# Patient Record
Sex: Female | Born: 1945 | Race: White | Hispanic: No | Smoking: Former smoker
Health system: Southern US, Community
[De-identification: ages and names within clinical notes are randomized; demographics above are authoritative.]

## PROBLEM LIST (undated history)

## (undated) DIAGNOSIS — M5136 Other intervertebral disc degeneration, lumbar region: Secondary | ICD-10-CM

## (undated) DIAGNOSIS — I428 Other cardiomyopathies: Secondary | ICD-10-CM

## (undated) DIAGNOSIS — K869 Disease of pancreas, unspecified: Secondary | ICD-10-CM

## (undated) DIAGNOSIS — N83291 Other ovarian cyst, right side: Secondary | ICD-10-CM

## (undated) DIAGNOSIS — K644 Residual hemorrhoidal skin tags: Secondary | ICD-10-CM

## (undated) DIAGNOSIS — I10 Essential (primary) hypertension: Secondary | ICD-10-CM

## (undated) DIAGNOSIS — M1990 Unspecified osteoarthritis, unspecified site: Secondary | ICD-10-CM

## (undated) DIAGNOSIS — I5022 Chronic systolic (congestive) heart failure: Secondary | ICD-10-CM

## (undated) DIAGNOSIS — Z9889 Other specified postprocedural states: Secondary | ICD-10-CM

## (undated) DIAGNOSIS — H269 Unspecified cataract: Secondary | ICD-10-CM

## (undated) DIAGNOSIS — C801 Malignant (primary) neoplasm, unspecified: Secondary | ICD-10-CM

## (undated) DIAGNOSIS — J45909 Unspecified asthma, uncomplicated: Secondary | ICD-10-CM

## (undated) DIAGNOSIS — T7840XA Allergy, unspecified, initial encounter: Secondary | ICD-10-CM

## (undated) DIAGNOSIS — R002 Palpitations: Secondary | ICD-10-CM

## (undated) DIAGNOSIS — M48061 Spinal stenosis, lumbar region without neurogenic claudication: Secondary | ICD-10-CM

## (undated) DIAGNOSIS — E785 Hyperlipidemia, unspecified: Secondary | ICD-10-CM

## (undated) DIAGNOSIS — Z85828 Personal history of other malignant neoplasm of skin: Secondary | ICD-10-CM

## (undated) DIAGNOSIS — K7689 Other specified diseases of liver: Secondary | ICD-10-CM

## (undated) DIAGNOSIS — M51369 Other intervertebral disc degeneration, lumbar region without mention of lumbar back pain or lower extremity pain: Secondary | ICD-10-CM

## (undated) DIAGNOSIS — K5790 Diverticulosis of intestine, part unspecified, without perforation or abscess without bleeding: Secondary | ICD-10-CM

## (undated) HISTORY — DX: Allergy, unspecified, initial encounter: T78.40XA

## (undated) HISTORY — DX: Spinal stenosis, lumbar region without neurogenic claudication: M48.061

## (undated) HISTORY — PX: HAND TENDON SURGERY: SHX663

## (undated) HISTORY — DX: Other intervertebral disc degeneration, lumbar region: M51.36

## (undated) HISTORY — DX: Diverticulosis of intestine, part unspecified, without perforation or abscess without bleeding: K57.90

## (undated) HISTORY — DX: Disease of pancreas, unspecified: K86.9

## (undated) HISTORY — DX: Unspecified osteoarthritis, unspecified site: M19.90

## (undated) HISTORY — DX: Other intervertebral disc degeneration, lumbar region without mention of lumbar back pain or lower extremity pain: M51.369

## (undated) HISTORY — DX: Essential (primary) hypertension: I10

## (undated) HISTORY — DX: Hyperlipidemia, unspecified: E78.5

## (undated) HISTORY — PX: ABDOMINAL HYSTERECTOMY: SHX81

## (undated) HISTORY — DX: Malignant (primary) neoplasm, unspecified: C80.1

## (undated) HISTORY — DX: Residual hemorrhoidal skin tags: K64.4

## (undated) HISTORY — DX: Other specified diseases of liver: K76.89

## (undated) HISTORY — PX: TONSILLECTOMY: SUR1361

## (undated) HISTORY — PX: SMALL INTESTINE SURGERY: SHX150

## (undated) HISTORY — DX: Unspecified cataract: H26.9

---
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
## 1992-05-05 HISTORY — PX: VAGINAL HYSTERECTOMY: SUR661

## 1997-10-26 ENCOUNTER — Ambulatory Visit (HOSPITAL_COMMUNITY): Admission: RE | Admit: 1997-10-26 | Discharge: 1997-10-26 | Payer: Self-pay | Admitting: Obstetrics and Gynecology

## 1998-05-24 ENCOUNTER — Ambulatory Visit (HOSPITAL_COMMUNITY): Admission: RE | Admit: 1998-05-24 | Discharge: 1998-05-24 | Payer: Self-pay | Admitting: Obstetrics and Gynecology

## 1999-10-08 ENCOUNTER — Encounter: Payer: Self-pay | Admitting: Obstetrics and Gynecology

## 1999-10-09 ENCOUNTER — Ambulatory Visit (HOSPITAL_COMMUNITY): Admission: RE | Admit: 1999-10-09 | Discharge: 1999-10-09 | Payer: Self-pay | Admitting: Obstetrics and Gynecology

## 2000-04-02 ENCOUNTER — Encounter: Payer: Self-pay | Admitting: Urology

## 2000-04-02 ENCOUNTER — Encounter: Admission: RE | Admit: 2000-04-02 | Discharge: 2000-04-02 | Payer: Self-pay | Admitting: Urology

## 2000-04-13 ENCOUNTER — Encounter: Payer: Self-pay | Admitting: General Surgery

## 2000-04-13 ENCOUNTER — Ambulatory Visit (HOSPITAL_COMMUNITY): Admission: RE | Admit: 2000-04-13 | Discharge: 2000-04-13 | Payer: Self-pay | Admitting: General Surgery

## 2000-04-29 ENCOUNTER — Encounter: Payer: Self-pay | Admitting: General Surgery

## 2000-05-04 ENCOUNTER — Inpatient Hospital Stay (HOSPITAL_COMMUNITY): Admission: RE | Admit: 2000-05-04 | Discharge: 2000-05-07 | Payer: Self-pay | Admitting: General Surgery

## 2000-05-04 ENCOUNTER — Encounter (INDEPENDENT_AMBULATORY_CARE_PROVIDER_SITE_OTHER): Payer: Self-pay | Admitting: Specialist

## 2000-10-09 ENCOUNTER — Ambulatory Visit (HOSPITAL_COMMUNITY): Admission: RE | Admit: 2000-10-09 | Discharge: 2000-10-09 | Payer: Self-pay | Admitting: Obstetrics and Gynecology

## 2000-10-09 ENCOUNTER — Encounter: Payer: Self-pay | Admitting: Obstetrics and Gynecology

## 2001-01-21 ENCOUNTER — Ambulatory Visit (HOSPITAL_COMMUNITY): Admission: RE | Admit: 2001-01-21 | Discharge: 2001-01-21 | Payer: Self-pay | Admitting: Urology

## 2001-01-21 ENCOUNTER — Encounter (INDEPENDENT_AMBULATORY_CARE_PROVIDER_SITE_OTHER): Payer: Self-pay | Admitting: Specialist

## 2001-10-13 ENCOUNTER — Encounter: Payer: Self-pay | Admitting: Obstetrics and Gynecology

## 2001-10-13 ENCOUNTER — Ambulatory Visit (HOSPITAL_COMMUNITY): Admission: RE | Admit: 2001-10-13 | Discharge: 2001-10-13 | Payer: Self-pay | Admitting: Obstetrics and Gynecology

## 2002-01-25 ENCOUNTER — Other Ambulatory Visit: Admission: RE | Admit: 2002-01-25 | Discharge: 2002-01-25 | Payer: Self-pay | Admitting: Obstetrics and Gynecology

## 2003-05-06 HISTORY — PX: HEMICOLECTOMY: SHX854

## 2003-05-23 ENCOUNTER — Ambulatory Visit (HOSPITAL_COMMUNITY): Admission: RE | Admit: 2003-05-23 | Discharge: 2003-05-23 | Payer: Self-pay | Admitting: Obstetrics and Gynecology

## 2004-02-20 ENCOUNTER — Ambulatory Visit (HOSPITAL_COMMUNITY): Admission: RE | Admit: 2004-02-20 | Discharge: 2004-02-20 | Payer: Self-pay | Admitting: Urology

## 2004-05-27 ENCOUNTER — Encounter: Admission: RE | Admit: 2004-05-27 | Discharge: 2004-05-27 | Payer: Self-pay | Admitting: Obstetrics and Gynecology

## 2004-06-17 ENCOUNTER — Other Ambulatory Visit: Admission: RE | Admit: 2004-06-17 | Discharge: 2004-06-17 | Payer: Self-pay | Admitting: Obstetrics and Gynecology

## 2005-03-19 ENCOUNTER — Ambulatory Visit (HOSPITAL_COMMUNITY): Admission: RE | Admit: 2005-03-19 | Discharge: 2005-03-19 | Payer: Self-pay | Admitting: Urology

## 2005-05-15 ENCOUNTER — Ambulatory Visit (HOSPITAL_COMMUNITY): Admission: RE | Admit: 2005-05-15 | Discharge: 2005-05-15 | Payer: Self-pay | Admitting: *Deleted

## 2005-06-02 ENCOUNTER — Encounter: Admission: RE | Admit: 2005-06-02 | Discharge: 2005-06-02 | Payer: Self-pay | Admitting: *Deleted

## 2005-10-27 ENCOUNTER — Encounter: Admission: RE | Admit: 2005-10-27 | Discharge: 2005-10-27 | Payer: Self-pay | Admitting: Obstetrics and Gynecology

## 2006-06-02 ENCOUNTER — Ambulatory Visit: Payer: Self-pay | Admitting: Family Medicine

## 2006-06-19 ENCOUNTER — Ambulatory Visit: Payer: Self-pay | Admitting: Family Medicine

## 2006-06-19 LAB — CONVERTED CEMR LAB
ALT: 27 units/L (ref 0–40)
AST: 24 units/L (ref 0–37)
Basophils Relative: 0.6 % (ref 0.0–1.0)
Bilirubin, Direct: 0.1 mg/dL (ref 0.0–0.3)
CO2: 33 meq/L — ABNORMAL HIGH (ref 19–32)
Calcium: 9.1 mg/dL (ref 8.4–10.5)
Chloride: 103 meq/L (ref 96–112)
Direct LDL: 115.7 mg/dL
Eosinophils Relative: 2 % (ref 0.0–5.0)
GFR calc Af Amer: 94 mL/min
Glucose, Bld: 93 mg/dL (ref 70–99)
HCT: 38.9 % (ref 36.0–46.0)
HDL: 60.4 mg/dL (ref >39.0)
Hemoglobin: 13.6 g/dL (ref 12.0–15.0)
Lymphocytes Relative: 28.8 % (ref 12.0–46.0)
MCV: 93.5 fL (ref 78.0–100.0)
Neutro Abs: 4.2 10*3/uL (ref 1.4–7.7)
Neutrophils Relative %: 60.7 % (ref 43.0–77.0)
Platelets: 278 10*3/uL (ref 150–400)
Sodium: 143 meq/L (ref 135–145)
Total Protein: 7 g/dL (ref 6.0–8.3)
VLDL: 41 mg/dL — ABNORMAL HIGH (ref 0–40)
WBC: 6.7 10*3/uL (ref 4.5–10.5)

## 2006-11-10 ENCOUNTER — Encounter: Admission: RE | Admit: 2006-11-10 | Discharge: 2006-11-10 | Payer: Self-pay | Admitting: Obstetrics and Gynecology

## 2007-05-04 ENCOUNTER — Ambulatory Visit: Payer: Self-pay | Admitting: Family Medicine

## 2007-05-04 DIAGNOSIS — J309 Allergic rhinitis, unspecified: Secondary | ICD-10-CM | POA: Insufficient documentation

## 2007-05-04 DIAGNOSIS — J45909 Unspecified asthma, uncomplicated: Secondary | ICD-10-CM

## 2007-05-04 DIAGNOSIS — I10 Essential (primary) hypertension: Secondary | ICD-10-CM

## 2007-05-04 DIAGNOSIS — M1990 Unspecified osteoarthritis, unspecified site: Secondary | ICD-10-CM

## 2007-05-04 DIAGNOSIS — E785 Hyperlipidemia, unspecified: Secondary | ICD-10-CM | POA: Insufficient documentation

## 2007-05-27 ENCOUNTER — Ambulatory Visit: Payer: Self-pay | Admitting: Family Medicine

## 2007-05-28 ENCOUNTER — Telehealth: Payer: Self-pay | Admitting: Family Medicine

## 2007-05-28 LAB — CONVERTED CEMR LAB
ALT: 29 units/L (ref 0–35)
Basophils Relative: 0.1 % (ref 0.0–1.0)
Bilirubin, Direct: 0.1 mg/dL (ref 0.0–0.3)
CO2: 31 meq/L (ref 19–32)
Direct LDL: 163.8 mg/dL
Eosinophils Relative: 2.9 % (ref 0.0–5.0)
GFR calc Af Amer: 82 mL/min
Glucose, Bld: 81 mg/dL (ref 70–99)
Hemoglobin: 13.1 g/dL (ref 12.0–15.0)
Lymphocytes Relative: 28.2 % (ref 12.0–46.0)
MCV: 95.2 fL (ref 78.0–100.0)
Monocytes Absolute: 0.6 10*3/uL (ref 0.2–0.7)
Neutro Abs: 4 10*3/uL (ref 1.4–7.7)
Neutrophils Relative %: 59.7 % (ref 43.0–77.0)
Potassium: 4.1 meq/L (ref 3.5–5.1)
Sodium: 140 meq/L (ref 135–145)
TSH: 0.8 microintl units/mL (ref 0.35–5.50)
Total Protein: 6.7 g/dL (ref 6.0–8.3)
VLDL: 42 mg/dL — ABNORMAL HIGH (ref 0–40)
WBC: 6.7 10*3/uL (ref 4.5–10.5)

## 2007-06-28 ENCOUNTER — Telehealth: Payer: Self-pay | Admitting: Family Medicine

## 2007-07-15 ENCOUNTER — Encounter: Payer: Self-pay | Admitting: Family Medicine

## 2007-07-21 ENCOUNTER — Telehealth: Payer: Self-pay | Admitting: Family Medicine

## 2007-07-23 ENCOUNTER — Ambulatory Visit: Payer: Self-pay | Admitting: Internal Medicine

## 2007-08-06 ENCOUNTER — Encounter: Payer: Self-pay | Admitting: Family Medicine

## 2007-08-06 ENCOUNTER — Ambulatory Visit: Payer: Self-pay | Admitting: Internal Medicine

## 2007-08-06 HISTORY — PX: COLONOSCOPY: SHX174

## 2007-09-10 ENCOUNTER — Telehealth: Payer: Self-pay | Admitting: Family Medicine

## 2007-10-08 ENCOUNTER — Ambulatory Visit: Payer: Self-pay | Admitting: Family Medicine

## 2007-10-11 LAB — CONVERTED CEMR LAB
Alkaline Phosphatase: 56 units/L (ref 39–117)
Bilirubin, Direct: 0.1 mg/dL (ref 0.0–0.3)
Cholesterol: 161 mg/dL (ref 0–200)
LDL Cholesterol: 78 mg/dL (ref 0–99)
Total Protein: 6.6 g/dL (ref 6.0–8.3)

## 2007-12-22 ENCOUNTER — Encounter: Admission: RE | Admit: 2007-12-22 | Discharge: 2007-12-22 | Payer: Self-pay | Admitting: Obstetrics and Gynecology

## 2008-03-02 ENCOUNTER — Telehealth: Payer: Self-pay | Admitting: Family Medicine

## 2008-05-02 ENCOUNTER — Encounter: Payer: Self-pay | Admitting: Family Medicine

## 2008-07-14 ENCOUNTER — Encounter: Payer: Self-pay | Admitting: Family Medicine

## 2008-09-08 ENCOUNTER — Ambulatory Visit: Payer: Self-pay | Admitting: Family Medicine

## 2008-11-15 ENCOUNTER — Ambulatory Visit: Payer: Self-pay | Admitting: Family Medicine

## 2008-11-15 LAB — CONVERTED CEMR LAB
Blood in Urine, dipstick: NEGATIVE
Ketones, urine, test strip: NEGATIVE
Nitrite: NEGATIVE
Urobilinogen, UA: 0.2

## 2009-02-02 ENCOUNTER — Encounter: Admission: RE | Admit: 2009-02-02 | Discharge: 2009-02-02 | Payer: Self-pay | Admitting: Obstetrics and Gynecology

## 2009-03-27 ENCOUNTER — Ambulatory Visit: Payer: Self-pay | Admitting: Family Medicine

## 2009-03-27 DIAGNOSIS — F411 Generalized anxiety disorder: Secondary | ICD-10-CM | POA: Insufficient documentation

## 2009-05-14 ENCOUNTER — Telehealth: Payer: Self-pay | Admitting: Family Medicine

## 2009-05-30 ENCOUNTER — Ambulatory Visit: Payer: Self-pay | Admitting: Family Medicine

## 2009-10-19 ENCOUNTER — Telehealth: Payer: Self-pay | Admitting: Family Medicine

## 2010-02-11 ENCOUNTER — Ambulatory Visit: Payer: Self-pay | Admitting: Family Medicine

## 2010-02-11 DIAGNOSIS — S335XXA Sprain of ligaments of lumbar spine, initial encounter: Secondary | ICD-10-CM

## 2010-04-08 ENCOUNTER — Encounter: Admission: RE | Admit: 2010-04-08 | Discharge: 2010-04-08 | Payer: Self-pay | Admitting: Obstetrics and Gynecology

## 2010-05-24 ENCOUNTER — Emergency Department (HOSPITAL_BASED_OUTPATIENT_CLINIC_OR_DEPARTMENT_OTHER)
Admission: EM | Admit: 2010-05-24 | Discharge: 2010-05-24 | Payer: Self-pay | Source: Home / Self Care | Admitting: Emergency Medicine

## 2010-05-25 ENCOUNTER — Encounter: Payer: Self-pay | Admitting: Obstetrics and Gynecology

## 2010-05-27 ENCOUNTER — Encounter: Payer: Self-pay | Admitting: Obstetrics and Gynecology

## 2010-05-28 ENCOUNTER — Encounter: Payer: Self-pay | Admitting: Family Medicine

## 2010-06-06 NOTE — Progress Notes (Signed)
Summary: refill Lorazepam  Phone Note Refill Request Message from:  Fax from Pharmacy on October 19, 2009 11:20 AM  Refills Requested: Medication #1:  ATIVAN 0.5 MG TABS 1-2 tabs q 6 hours as needed anxiety   Dosage confirmed as above?Dosage Confirmed   Last Refilled: 07/06/2009  Method Requested: Fax to Local Pharmacy Initial call taken by: Raechel Ache, RN,  October 19, 2009 11:21 AM Caller: [facility]  [phone]*  Follow-up for Phone Call        call in #60 with 5 rf Follow-up by: Nelwyn Salisbury MD,  October 19, 2009 4:36 PM  Additional Follow-up for Phone Call Additional follow up Details #1::        Rx faxed to pharmacy Additional Follow-up by: Raechel Ache, RN,  October 19, 2009 4:43 PM    Prescriptions: ATIVAN 0.5 MG TABS (LORAZEPAM) 1-2 tabs q 6 hours as needed anxiety  #60 x 5   Entered by:   Raechel Ache, RN   Authorized by:   Nelwyn Salisbury MD   Signed by:   Raechel Ache, RN on 10/19/2009   Method used:   Historical   RxID:   [id number]

## 2010-06-06 NOTE — Assessment & Plan Note (Signed)
Summary: back pain/flu shot/njr   Vital Signs:  Patient profile:   65 year old female Weight:      176 pounds O2 Sat:      96 % Temp:     97.6 degrees F Pulse rate:   92 / minute BP sitting:   112 / 74  (left arm)  Vitals Entered By: Pura Spice, RN (February 11, 2010 1:14 PM) CC: back pain was paving driveway  hurts when standing  with pain down Left leg.   History of Present Illness: Here for 2 days of stiffness and pain in the left lower back which radiates down the left leg. No weakness or numbness. This started the morning after she used paving stones to pave her driveway. Heat and Advil help a little.   Allergies: 1)  ! Lipitor  Past History:  Past Medical History: Reviewed history from 11/15/2008 and no changes required. Allergic rhinitis Hyperlipidemia Hypertension Osteoarthritis Asthma sees Dr. Stefano Gaul for GYN exams  Past Surgical History: Reviewed history from 11/15/2008 and no changes required. colonoscopy 08-06-07 per Dr. 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         Flu Vaccine Consent Questions     Do you have a history of severe allergic reactions to this vaccine? no    Any prior history of allergic reactions to egg and/or gelatin? no    Do you have a sensitivity to the preservative Thimersol? no    Do you have a past history of Guillan-Barre Syndrome? no    Do you  currently have an acute febrile illness? no    Have you ever had a severe reaction to latex? no    Vaccine information given and explained to patient? yes    Are you currently pregnant? no    Lot Number:[id number]   Exp Date:11/02/2010   Site Given  Left Deltoid IM Pura Spice, RN  February 11, 2010 1:16 PM   Physical Exam  General:  in some pain, walks with a slight limp Abdomen:  Bowel sounds positive,abdomen soft and non-tender without masses, organomegaly or hernias noted. Msk:  No deformity or scoliosis noted of thoracic or lumbar spine.  Extending her spine is painful, though ROM is intact. negative SLR Neurologic:  alert & oriented X3, strength normal in all extremities, and sensation intact to light touch.     Impression & Recommendations:  Problem # 1:  LUMBAR STRAIN (ICD-847.2)  Complete Medication List: 1)  Cartia Xt 240 Mg Cp24 (Diltiazem hcl coated beads) .... Take 1 tab by mouth daily 2)  Premarin 0.625 Mg Tabs (Estrogens conjugated) .[name].. 1 twice a wk 3)  Co Q-10 50 Mg Caps (Coenzyme q10) .[name].. 1 by mouth once daily 4)  Multivitamins Tabs (Multiple vitamin) .[name].. 1 by mouth once daily 5)  Bl Vitamin E 200 Unit Caps (Vitamin e) .[name].. 1 by mouth once daily 6)  Crestor 5 Mg Tabs (Rosuvastatin calcium) .[name].. 1 by mouth once daily 7)  Zyrtec Allergy 10 Mg Tabs (Cetirizine hcl) .... Once daily 8)  Ventolin Hfa 108 (90  Base) Mcg/act Aers (Albuterol sulfate) .... 2 puffs q 4 hours as needed 9)  Ativan 0.5 Mg Tabs (Lorazepam) .[name].. 1-2 tabs q 6 hours as needed anxiety 10)  Dulcolax 5 Mg Tbec (Bisacodyl) 11)  Diovan Hct 320-25 Mg Tabs (Valsartan-hydrochlorothiazide) .... Take 1 tab daily 12)  Flexeril 10 Mg Tabs (Cyclobenzaprine hcl) .... Three times a day as needed for spasm 13)  Prednisone (pak) 10 Mg Tabs (Prednisone) .... As directed for 12 days  Other Orders: Admin 1st Vaccine (16109) Flu Vaccine 22yrs + (60454)  Patient Instructions: 1)  Please schedule a follow-up  appointment as needed .  Prescriptions: PREDNISONE (PAK) 10 MG TABS (PREDNISONE) as directed for 12 days  #1 x 0   Entered and Authorized by:   Nelwyn Salisbury MD   Signed by:   Nelwyn Salisbury MD on 02/11/2010   Method used:   [facility]        [facility]  [address] [phone][facility] ([facility]ail)       [address] [place], [place]  [address]       Ph: [phone] or [phone]       Fax: [phone]   RxID:   [id number] FLEXERIL 10 MG TABS (CYCLOBENZAPRINE HCL) three times a day as needed for spasm  #60 x 2   Entered and Authorized by:   Nelwyn Salisbury MD   Signed by:   Nelwyn Salisbury MD on 02/11/2010   Method used:   [facility]        [facility]  [address] [phone][facility] (retail)       [address] [place], [place]  [address]       Ph: [phone] or [phone]       Fax: [phone]   RxID:   [id number]

## 2010-06-06 NOTE — Progress Notes (Signed)
Summary: Pt rcvd Diovan 160-12.5, but it should be for 320-25mg   Phone Note Call from Patient Call back at Home Phone [phone]   Caller: Patient Summary of Call: Pt called and said that she has been taking Diovan 320-25mg  ever since Dr. Clent Ridges prescribed dosage. Pt rcvd her order from [facility], and it was for Diovan 160-12.5mg  HCT. Pt is wondering why med has been changed. Please call in original dose of Diovan 320-25mg  to [facility] order.  Initial call taken by: Lucy Antigua,  May 14, 2009 4:34 PM  Follow-up for Phone Call        I agree. The last refill request we got from [facility] had this lower dose for some reason. cancel this and call in Diovan 320/25 once daily #90 with 3 rf Follow-up by: Nelwyn Salisbury MD,  May 15, 2009 11:49 AM  Additional Follow-up for Phone Call Additional follow up Details #1::        Phone Call Completed, Rx Called In Additional Follow-up by: Alfred Levins, CMA,  May 15, 2009 12:14 PM    New/Updated Medications: DIOVAN HCT 320-25 MG TABS (VALSARTAN-HYDROCHLOROTHIAZIDE) Take 1 tab daily Prescriptions: DIOVAN HCT 320-25 MG TABS (VALSARTAN-HYDROCHLOROTHIAZIDE) Take 1 tab daily  #90 x 3   Entered by:   Alfred Levins, CMA   Authorized by:   Nelwyn Salisbury MD   Signed by:   Alfred Levins, CMA on 05/15/2009   Method used:   Electronically to        [facility]* (mail-order)             ,          Ph: [phone]       Fax: [phone]   RxID:   [id number]

## 2010-06-06 NOTE — Assessment & Plan Note (Signed)
Summary: st/dry cough/njr   Vital Signs:  Patient profile:   65 year old female Weight:      180 pounds Temp:     98.8 degrees F oral Pulse rate:   101 / minute BP sitting:   108 / 70  (left arm) Cuff size:   large  Vitals Entered By: Alfred Levins, CMA (May 30, 2009 3:49 PM) CC: dry cough x2 days   History of Present Illness: Here for 2 days of a dry cough and a ST. Some hoarseness. No sinus symptoms or fever. On Robitussin.  Allergies: 1)  ! Lipitor  Past History:  Past Medical History: Reviewed history from 11/15/2008 and no changes required. Allergic rhinitis Hyperlipidemia Hypertension Osteoarthritis Asthma sees Dr. Stefano Gaul for GYN exams  Review of Systems  The patient denies anorexia, fever, weight loss, weight gain, vision loss, decreased hearing, hoarseness, chest pain, syncope, dyspnea on exertion, peripheral edema, headaches, hemoptysis, abdominal pain, melena, hematochezia, severe indigestion/heartburn, hematuria, incontinence, genital sores, muscle weakness, suspicious skin lesions, transient blindness, difficulty walking, depression, unusual weight change, abnormal bleeding, enlarged lymph nodes, angioedema, breast masses, and testicular masses.    Physical Exam  General:  Well-developed,well-nourished,in no acute distress; alert,appropriate and cooperative throughout examination Head:  Normocephalic and atraumatic without obvious abnormalities. No apparent alopecia or balding. Eyes:  No corneal or conjunctival inflammation noted. EOMI. Perrla. Funduscopic exam benign, without hemorrhages, exudates or papilledema. Vision grossly normal. Ears:  External ear exam shows no significant lesions or deformities.  Otoscopic examination reveals clear canals, tympanic membranes are intact bilaterally without bulging, retraction, inflammation or discharge. Hearing is grossly normal bilaterally. Nose:  External nasal examination shows no deformity or inflammation.  Nasal mucosa are pink and moist without lesions or exudates. Mouth:  Oral mucosa and oropharynx without lesions or exudates.  Teeth in good repair. Neck:  No deformities, masses, or tenderness noted. Lungs:  Normal respiratory effort, chest expands symmetrically. Lungs are clear to auscultation, no crackles or wheezes.   Impression & Recommendations:  Problem # 1:  VIRAL URI (ICD-465.9)  Her updated medication list for this problem includes:    Zyrtec Allergy 10 Mg Tabs (Cetirizine hcl) ..... Once daily    Hydromet 5-1.5 Mg/108ml Syrp (Hydrocodone-homatropine) .[name]... 1 tsp q 4hours as needed cough  Complete Medication List: 1)  Cartia Xt 240 Mg Cp24 (Diltiazem hcl coated beads) .... Take 1 tab by mouth daily 2)  Premarin 0.625 Mg Tabs (Estrogens conjugated) .[name].. 1 twice a wk 3)  Co Q-10 50 Mg Caps (Coenzyme q10) .[name].. 1 by mouth once daily 4)  Multivitamins Tabs (Multiple vitamin) .[name].. 1 by mouth once daily 5)  Bl Vitamin E 200 Unit Caps (Vitamin e) .[name].. 1 by mouth once daily 6)  Crestor 5 Mg Tabs (Rosuvastatin calcium) .[name].. 1 by mouth once daily 7)  Singulair 10 Mg Tabs (Montelukast sodium) .[name].. 1 by mouth daily 8)  Zyrtec Allergy 10 Mg Tabs (Cetirizine hcl) .... Once daily 9)  Ventolin Hfa 108 (90 Base) Mcg/act Aers (Albuterol sulfate) .... 2 puffs q 4 hours as needed 10)  Ativan 0.5 Mg Tabs (Lorazepam) .[name].. 1-2 tabs q 6 hours as needed anxiety 11)  Dulcolax 5 Mg Tbec (Bisacodyl) 12)  Reglan 10 Mg Tabs (Metoclopramide hcl) 13)  Diovan Hct 320-25 Mg Tabs (Valsartan-hydrochlorothiazide) .... Take 1 tab daily 14)  Hydromet 5-1.5 Mg/59ml Syrp (Hydrocodone-homatropine) .[name].. 1 tsp q 4hours as needed cough  Patient Instructions: 1)  Please schedule a follow-up appointment as needed . rest, drink  fluids. Prescriptions: HYDROMET 5-1.5 MG/5ML SYRP (HYDROCODONE-HOMATROPINE) 1 tsp q 4hours as needed cough  #240 x 0   Entered and Authorized by:   Nelwyn Salisbury MD   Signed by:   Nelwyn Salisbury MD on  05/30/2009   Method used:   Print then Give to Patient   RxID:   [id number]

## 2010-06-12 NOTE — Letter (Signed)
Summary: [facility]   Imported By: Maryln Gottron 06/07/2010 11:26:16  _____________________________________________________________________  External Attachment:    Type:   Image     Comment:   External Document

## 2010-07-03 ENCOUNTER — Other Ambulatory Visit: Payer: Self-pay | Admitting: Family Medicine

## 2010-09-20 NOTE — Discharge Summary (Signed)
[facility]  Patient:    Tina May, Tina May                MRN: [id number] Adm. Date:  [id number] Disc. Date: [id number] Attending:  Brandy Hale CC:         Lindaann Slough, M.D.  Janine Limbo, M.D.  Genene Churn. Sherin Quarry, M.D.   Discharge Summary  DISCHARGE DIAGNOSES: 1. Benign hemangioma of the jejunum. 2. Small benign-appearing liver cyst. 3. Recent urinary tract infection.  PROCEDURES:  Laparoscopic-assisted small bowel resection with frozen section on May 04, 2000.  HISTORY OF PRESENT ILLNESS:  This is a 65 year old, white female who was recently evaluated by Dr. Stefano Gaul for pelvic pain and suprapubic discomfort with dysuria.  She was treated for a urinary tract infection and a CAT scan was ordered that showed two, tiny benign liver cysts in a jejunal intraluminal solitary soft tissue mass seen on several cuts.  She was then sent to me by Dr. Brunilda Payor for evaluation of the abnormal CT scan.  She has no GI symptoms specifically.  She had had a colonoscopy in July 2001, by Dr. Sherin Quarry, and this was normal.  On exam, she had no abnormal abdominal findings and no adenopathy was noted.  A small bowel enteroclysis study confirmed the presence of a jejunal intraluminal filling defect.  The patient was advised of multiple diagnostic possibilities.  The decision was made for elective resection.  HOSPITAL COURSE:  On the day of admission, the patient was taken to the operating room where she underwent laparoscopic-assisted small bowel resection.  We were able to palpate a small polypoid mass in the upper jejunum.  We were able to perform a laparoscopic-assisted segmental small bowel resection removing only about four to six inches of small bowel.  Frozen section diagnosis and final diagnosis was a benign hemangioma.  Postoperatively, the patient did well.  She progressed in her diet and activities fairly briskly and was able to be  discharged on May 07, 2000, postop day #3.  At the time of discharge, she was tolerating diet, had had bowel movement, voiding okay, abdomen benign, wounds looked good and she wanted to go home.  FOLLOWUP:  She was asked to return to see me in the office in about five days for staple removal.  DIET:  Diet was discussed.  ACTIVITIES:  Activity was discussed. DD:  05/18/00 TD:  05/18/00 Job: [id number]

## 2010-09-20 NOTE — Op Note (Signed)
[facility]  Patient:    Tina May, Tina May                MRN: [id number] Proc. Date: 05/04/00 Adm. Date:  [id number] Attending:  Brandy Hale CC:         Genene Churn. Sherin Quarry, M.D.  Lindaann Slough, M.D.  Janine Limbo, M.D.   Operative Report  PREOPERATIVE DIAGNOSIS:  Intraluminal mass of jejunum.  POSTOPERATIVE DIAGNOSIS:  Hemangioma of jejunum.  OPERATION PERFORMED:  Laparoscopically-assisted small bowel resection with frozen section.  SURGEON:  Angelia Mould. Derrell Lolling, M.D.  ASSISTANT:  Chevis Pretty, M.D.  OPERATIVE INDICATIONS:  This is a 65 year old white female, who was recently evaluated for dysuria and pelvic pain.  As part of her work-up, a CT scan was performed, which showed a jejunal intraluminal solitary soft tissue mass.  The patient really does not have any GI symptoms and this was thought to be an incidental finding.  I saw the patient and we obtained an enteroclysis small bowel study and that study was diagnostic of an intraluminal mass in the proximal jejunum felt to be as much as 25 mm in maximum diameter.  Benign etiology was suggested, but not confirmed by radiographic criteria.  Fairly large differential diagnosis, including benign and malignant disease was entertained.  Risk of intussusception was also entertained.  Growth of the tumor was felt to be possible.  I advised the patient of these risks and she and her husband both agreed that they would like this area resected.  OPERATIVE FINDINGS:  The patient had a 2.0 x 1.0 cm fleshy tumor in the jejunum about 12-15 inches distal to the ligament of Treitz.  Frozen section revealed this to be probably a benign hemangioma.  Dr. Jimmy Picket read the pathology.  No other abnormalities were noted.  There was no adenopathy. There was no thickening of the mesentery.  OPERATIVE TECHNIQUE:  Following the induction of general endotracheal anesthesia, the patients abdomen  was prepped and draped in a sterile fashion. Marcaine 0.5% with epinephrine was used as a local infiltration anesthetic.  A short transverse incision was made below the umbilicus.  The fascia was incised transversely.  The abdominal cavity was entered under direct vision. A 10 mm Hasson trocar was inserted and secured with a purse-string suture of 0 Vicryl.  Pneumoperitoneum was created.  Video camera was inserted.  Two 5 mm trocars were placed, one in the right upper quadrant and one in the lower midline.  We positioned the patient and examined the liver and gallbladder, which were felt to be normal.  Examined the stomach and duodenum and transverse colon, which were felt to be normal.  With good positioning, we were able to move the transverse colon and the omentum out of the way.  We were able to run the small bowel in its entirety.  We identified the ligament of Treitz.  There was no abnormality of the small bowel on inspection, but about 1 to 1.5 feet distal to the ligament of Treitz we felt that there was a bumpy area, which was suggested by milking the bowel with the Glassman clamps. We elected at that point to make a short vertical midline incision above the umbilicus approximately 4 inches long.  The fascia was incised in the midline and the abdominal cavity entered.  We delivered the small bowel and again, examined it thoroughly from the ligament of Treitz all the way to the ileocecal valve.  We did palpate a 2  cm soft, rubbery mass in the proximal jejunum about 12-15 inches distal to the ligament of Treitz.  This was marked with a suture.  The small bowel was transected about 3 inches proximal and 3 inches distal to this area using a GIA stapling device.  Mesenteric vessels were isolated, clamped, divided and ligated with 2-0 silk ties.  We opened the specimen on a back table and found the above described tumor.  Dr. Jimmy Picket did a frozen section and felt that this was a  benign hemangioma or other benign vascular tumor.  Anastomosis was created between the proximal and distal limbs of small bowel using the GIA stapling device.  The defect in the bowel wall was closed with a TA-60 stapling device.  There was no bleeding internally from the staple lines.  Externally, there were a couple of bleeding points that were controlled with figure-of-eight sutures of 3-0 silk.  The mesentery was closed with interrupted sutures of 3-0 silk.  We then changed our instruments and gloves.  The abdomen was irrigated with saline.  The area of dissection and the omentum and the colon were inspected and there was no bleeding.  Midline fascia was closed with a running suture of #1 PDS.  The trocars were removed and the fascia at the umbilicus was closed with 0 Vicryl. All of the incisions were closed with skin staples following irrigation. Clean bandages were placed and the patient taken to the recovery room in stable condition.  Estimated blood loss was about 50 cc or less.  Sponge, needle and instrument counts were correct. DD:  05/04/00 TD:  05/04/00 Job: [id number]

## 2010-09-20 NOTE — Op Note (Signed)
[facility]  Patient:    Tina May, Tina May Visit Number: [id number] MRN: [id number]          Service Type: DSU Location: DAY Attending Physician:  Laqueta Jean Dictated by:   Vonzell Schlatter [name], M.D. Admit Date:  01/21/2001                             Operative Report  PREOPERATIVE DIAGNOSIS:  Interstitial cystitis.  POSTOPERATIVE DIAGNOSIS:  Interstitial cystitis.  OPERATION/PROCEDURE:  Cystourethroscopy, urethral dilation, hydrodistention of the bladder, bladder biopsy, cauterization of the biopsy sites, insertion of ______ Marcaine, injection of Marcaine-Kenalog solution in the periurethral space.  SURGEON:  Sigmund I. [name], M.D.  ANESTHESIA:  General endotracheal.  PREPARATION:  After ______  per anesthesia the patient was brought to the operating room and placed on the operating table in the dorsal lithotomy position where general endotracheal anesthesia was induced.  She was then replaced in the dorsolithotomy position where the pubis was prepped with Betadine solution and draped in the usual fashion.  Review of history:  This patient is a 65 year old female, with significant history of bladder and suprapubic pain and cramping, frequency, and urgency. The patient did get better after taking Ditropan XL 5 mg one per day.  She is now for cysto HID.  DESCRIPTION OF PROCEDURE:  The urethra was dilated to a size 7 [place] with ______ urethral sounds.  Following this, systematic hydrodistention was accomplished from 800 cc bladder capacity to 900 cc bladder capacity.  Fundus, bladder biopsies were taken and the bladder biopsy sites were cauterized. ______ Marcaine was inserted into the bladder and Marcaine-Kenalog was injected into the periurethral space.  Further documentation was accomplished. I saw multiple areas of microhemorrhage.  Following this, the patient was given Toradol, awakened, and taken to recovery in good  condition. Dictated by:   Vonzell Schlatter [name], M.D. Attending Physician:  Laqueta Jean DD:  01/21/01 TD:  01/21/01 Job: [id number]

## 2010-09-20 NOTE — Assessment & Plan Note (Signed)
[facility] OFFICE NOTE   Tina May, Tina May [name]                       MRN:          [id number]  DATE:06/02/2006                            DOB:          04/14/1946    This is a 65 year old woman here to establish with our practice. Her  chief complaint is poorly controlled hypertension. She feels fine and  has no complaints at all. She previously received her primary care from  Dr. Lenise Arena at the [facility] but has transferred to  [place]. She had been on metoprolol for a while which seemed to control her  blood pressure reasonably well; however, when she began being treated  more aggressively for asthma she was told that this could worsen it and  so it was stopped and instead she was put on Cartia  XT and this has not  controlled her blood pressures as well. As far as her asthma goes, it is  under excellent control. She had been on [employer] on a daily  basis until she took herself off of it about 3 months ago. She also has  a __________  inhaler to use as needed but has not used it once during  these 3 months.   OTHER PAST MEDICAL HISTORY:  She sees Dr. Stefano Gaul for gynecology care.  She has had 3 vaginal deliveries. She had a normal flexible  sigmoidoscopy 6 years ago but has had no endoscopies since. She has a  history of degenerative arthritis, of allergies with hypertension, and  of high cholesterol. She had a total abdominal hysterectomy 15 years ago  with her ovaries left intact. 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 Also during her workup for shortness of  breath (which turned out to be asthma about 6 months ago), she had a  cardiac catheterization which was completely normal.   ALLERGIES:  LIPITOR caused muscle cramps.   CURRENT MEDICATIONS:  1. Cartia XT 240 mg per day.  2. Singulair 10 mg per day.  3. Crestor 5 mg per day.  4. Zetia 10 mg  per day.  5. Premarin 0.625 mg 1 every 3 days.  6. Multivitamins.  7. Aspirin 81 mg per day.  8. __________  [facility] inhaler as needed.   HABITS:  She quit smoking cigarettes many years ago, she drinks some  alcohol.   SOCIAL HISTORY:  She is married, she works in a Actuary at [employer].   FAMILY HISTORY:  Remarkable for prostate cancer, high cholesterol and  hypertension in her parents.   OBJECTIVE:  VITAL SIGNS:  Height 5 foot 3 inches, weight 176, blood  pressure 162/98, pulse 98 and regular.  GENERAL:  She is a good bit overweight.  NECK:  Supple without lymphadenopathy or masses.  LUNGS:  Clear.  CARDIAC:  Rate and rhythm were regular without gallops, murmurs or rubs.  Distal pulses are full.   ASSESSMENT/PLAN:  1. Poorly controlled hypertension. Will continue Cartia XT but add      Diovan HCT  160/25 to take once daily. Asked to see her back in 3      weeks. Will do some blood work at that time. Will also have her old      records sent to [place].  2. Asthma apparently stable.  3. Hyperlipidemia. Will plan to check some fasting laboratories at her      next visit.     Tera Mater. Clent Ridges, MD  Electronically Signed    SAF/MedQ  DD: 06/02/2006  DT: 06/03/2006  Job #: [id number]

## 2010-09-20 NOTE — Cardiovascular Report (Signed)
NAME:  Tina May, Tina May NO.:  [id number]   MEDICAL RECORD NO.:  [id number]          PATIENT TYPE:  OIB   LOCATION:  2899                         FACILITY:  [facility]   PHYSICIAN:  Meade Maw, M.D.    DATE OF BIRTH:  May 28, 1945   DATE OF PROCEDURE:  DATE OF DISCHARGE:                              CARDIAC CATHETERIZATION   INDICATION FOR STUDY:  Dyspnea with questionable apical ischemia noted on  stress Cardiolite.   PROCEDURE:  After obtaining written informed consent the patient was brought  to the cardiac catheterization lab in a post absorptive state. Preop  sedation was achieved using Versed 2 mg IV, fentanyl 50 mcg IV. The right  groin was prepped and draped in usual sterile fashion. Local anesthesia was  achieved using 1% Xylocaine. A 6-French hemostasis sheath was placed into  the right femoral artery using a modified Seldinger technique. Selective  coronary angiography was performed using 6-French JL-4, JR-4 Judkins  catheter. Multiple views were obtained. All catheter exchanges were made  over a guide wire. Single-plane ventriculogram was performed in the RAO  position using a 6-French pigtail curved catheter. There was no identifiable  coronary artery disease. There were no complications. The patient was  transferred to the holding area. Hemostasis sheath was removed. Hemostasis  was achieved using a FemoStop device.   FINDINGS:  Aortic pressure was 125/67, LV pressure was 122/5 with an EDP of  9. Single-plane ventriculogram revealed normal wall motion with an ejection  fraction of 65%. Coronary angiography, left main coronary artery bifurcated  into the left anterior descending and circumflex vessel. There was no  significant disease noted in the left main coronary artery. Left anterior  descending: left anterior descending is a large caliber vessel gives rise to  a large D1, small D2, goes on in as apical branch. There is no disease noted  in the left  anterior descending or its branches. Circumflex vessel:  Circumflex vessel is codominant for the posterior circulation, gives rise to  a early large OM1 which trifurcates and goes on the end as a large  posterolateral branch. There is no disease noted in the circumflex or its  branches. The right coronary artery: Right coronary artery is a  moderate to  large artery, gives rise to a small PDA, small PL branch. There is no  disease noted in the right coronary artery or its branches. Single plane  ventriculogram revealed normal wall motion. There was no mitral  regurgitation noted.   FINAL IMPRESSION:  1.  Normal coronary angiography.  2.  Normal single-plane ventriculogram.  3.  False positive most likely secondary to breast attenuation.  Dyspnea may      be related to the patient's symptoms of asthma.      Meade Maw, M.D.  Electronically Signed     HP/MEDQ  D:  05/15/2005  T:  05/15/2005  Job:  [id number]   cc:   Dr. Lenise Arena or Izola Price?????

## 2010-10-01 ENCOUNTER — Other Ambulatory Visit: Payer: Self-pay | Admitting: *Deleted

## 2010-10-01 MED ORDER — DILTIAZEM HCL ER COATED BEADS 240 MG PO CP24: 240.0000 mg | ORAL_CAPSULE | Freq: Every day | ORAL | Status: DC

## 2010-10-25 ENCOUNTER — Other Ambulatory Visit: Payer: Self-pay | Admitting: Family Medicine

## 2010-11-05 ENCOUNTER — Encounter: Payer: Self-pay | Admitting: Family Medicine

## 2010-11-05 ENCOUNTER — Ambulatory Visit (INDEPENDENT_AMBULATORY_CARE_PROVIDER_SITE_OTHER): Payer: Medicare Other | Admitting: Family Medicine

## 2010-11-05 ENCOUNTER — Ambulatory Visit (INDEPENDENT_AMBULATORY_CARE_PROVIDER_SITE_OTHER)
Admission: RE | Admit: 2010-11-05 | Discharge: 2010-11-05 | Disposition: A | Discharge status: Home / Self Care | Payer: Medicare Other | Source: Ambulatory Visit | Attending: Family Medicine | Admitting: Family Medicine

## 2010-11-05 VITALS — BP 120/80 | HR 72 | Temp 98.1°F | Ht 62.0 in | Wt 178.0 lb

## 2010-11-05 DIAGNOSIS — E785 Hyperlipidemia, unspecified: Secondary | ICD-10-CM

## 2010-11-05 DIAGNOSIS — I10 Essential (primary) hypertension: Secondary | ICD-10-CM

## 2010-11-05 DIAGNOSIS — M810 Age-related osteoporosis without current pathological fracture: Secondary | ICD-10-CM

## 2010-11-05 DIAGNOSIS — M545 Low back pain: Secondary | ICD-10-CM

## 2010-11-05 LAB — POCT URINALYSIS DIPSTICK
Leukocytes, UA: NEGATIVE
Nitrite, UA: NEGATIVE
Protein, UA: NEGATIVE
pH, UA: 6

## 2010-11-05 LAB — CBC WITH DIFFERENTIAL/PLATELET
Basophils Absolute: 0 10*3/uL (ref 0.0–0.1)
Eosinophils Relative: 3.2 % (ref 0.0–5.0)
HCT: 39.8 % (ref 36.0–46.0)
Hemoglobin: 13.6 g/dL (ref 12.0–15.0)
Lymphs Abs: 1.6 10*3/uL (ref 0.7–4.0)
MCV: 96.4 fl (ref 78.0–100.0)
Monocytes Absolute: 0.5 10*3/uL (ref 0.1–1.0)
Monocytes Relative: 8.5 % (ref 3.0–12.0)
Neutro Abs: 4 10*3/uL (ref 1.4–7.7)
Platelets: 234 10*3/uL (ref 150.0–400.0)
RDW: 13.4 % (ref 11.5–14.6)

## 2010-11-05 LAB — HEPATIC FUNCTION PANEL
ALT: 28 U/L (ref 0–35)
AST: 31 U/L (ref 0–37)
Albumin: 4.3 g/dL (ref 3.5–5.2)
Total Bilirubin: 0.4 mg/dL (ref 0.3–1.2)

## 2010-11-05 LAB — BASIC METABOLIC PANEL
BUN: 21 mg/dL (ref 6–23)
Chloride: 103 mEq/L (ref 96–112)
GFR: 97.16 mL/min (ref >60.00)
Glucose, Bld: 93 mg/dL (ref 70–99)
Potassium: 4.1 mEq/L (ref 3.5–5.1)
Sodium: 141 mEq/L (ref 135–145)

## 2010-11-05 LAB — LIPID PANEL
HDL: 62.2 mg/dL (ref >39.00)
Total CHOL/HDL Ratio: 3
Triglycerides: 196 mg/dL — ABNORMAL HIGH (ref 0.0–149.0)

## 2010-11-05 LAB — TSH: TSH: 0.67 u[IU]/mL (ref 0.35–5.50)

## 2010-11-05 MED ORDER — ROSUVASTATIN CALCIUM 5 MG PO TABS: 5.0000 mg | ORAL_TABLET | Freq: Every day | ORAL | Status: DC

## 2010-11-05 MED ORDER — LORAZEPAM 0.5 MG PO TABS: 0.5000 mg | ORAL_TABLET | Freq: Four times a day (QID) | ORAL | Status: DC | PRN

## 2010-11-05 MED ORDER — VALSARTAN-HYDROCHLOROTHIAZIDE 320-25 MG PO TABS: 1.0000 | ORAL_TABLET | Freq: Every day | ORAL | Status: DC

## 2010-11-05 MED ORDER — DILTIAZEM HCL ER COATED BEADS 240 MG PO CP24: 240.0000 mg | ORAL_CAPSULE | Freq: Every day | ORAL | Status: DC

## 2010-11-05 NOTE — Progress Notes (Signed)
  Subjective:    Patient ID: Tina May, female    DOB: 06-13-1945, 65 y.o.   MRN: [id number]  HPI Here to follow up on HTn and lipids. She is fasting. She still complains of daily low back pain. She takes some Aleve every morning, and this helps. She remains active and plays golf twice a week.    Review of Systems  Constitutional: Negative.   Respiratory: Negative.   Cardiovascular: Negative.   Musculoskeletal: Positive for back pain.       Objective:   Physical Exam  Constitutional: She appears well-developed and well-nourished.  Cardiovascular: Normal rate, regular rhythm, normal heart sounds and intact distal pulses.   Pulmonary/Chest: Effort normal and breath sounds normal.  Musculoskeletal:       Mildly tender in the low back with full ROM           Assessment & Plan:  Get labs today. Get an Xray of the lumbar spine today. Set up her first DEXA

## 2010-11-07 ENCOUNTER — Encounter: Payer: Self-pay | Admitting: Family Medicine

## 2010-11-07 ENCOUNTER — Telehealth: Payer: Self-pay | Admitting: *Deleted

## 2010-11-07 NOTE — Telephone Encounter (Signed)
Left message to call back  

## 2010-11-07 NOTE — Telephone Encounter (Signed)
Message copied by Romualdo Bolk on Thu Nov 07, 2010 11:37 AM ------      Message from: Gershon Crane A      Created: Thu Nov 07, 2010  9:53 AM       Shows some  degenerative changes to the discs but nothing of a surgical nature

## 2010-11-07 NOTE — Telephone Encounter (Signed)
Spoke with pt and gave results. 

## 2010-12-03 ENCOUNTER — Other Ambulatory Visit (HOSPITAL_COMMUNITY): Payer: Medicare Other

## 2010-12-04 ENCOUNTER — Other Ambulatory Visit: Payer: Self-pay | Admitting: Family Medicine

## 2010-12-04 ENCOUNTER — Ambulatory Visit (HOSPITAL_COMMUNITY)
Admission: RE | Admit: 2010-12-04 | Discharge: 2010-12-04 | Disposition: A | Discharge status: Home / Self Care | Payer: Medicare Other | Source: Ambulatory Visit | Attending: Family Medicine | Admitting: Family Medicine

## 2010-12-04 ENCOUNTER — Inpatient Hospital Stay (HOSPITAL_COMMUNITY): Admission: RE | Admit: 2010-12-04 | Payer: Medicare Other | Source: Ambulatory Visit

## 2010-12-04 ENCOUNTER — Other Ambulatory Visit (HOSPITAL_COMMUNITY): Payer: Medicare Other

## 2010-12-04 DIAGNOSIS — Z780 Asymptomatic menopausal state: Secondary | ICD-10-CM | POA: Insufficient documentation

## 2010-12-04 DIAGNOSIS — Z13820 Encounter for screening for osteoporosis: Secondary | ICD-10-CM | POA: Insufficient documentation

## 2010-12-04 NOTE — Progress Notes (Signed)
Addended by: Gershon Crane A on: 12/04/2010 08:49 AM   Modules accepted: Orders

## 2010-12-11 ENCOUNTER — Encounter: Payer: Self-pay | Admitting: Family Medicine

## 2011-02-02 ENCOUNTER — Other Ambulatory Visit: Payer: Self-pay | Admitting: Family Medicine

## 2011-02-04 NOTE — Telephone Encounter (Signed)
Script sent e-scribe 

## 2011-03-03 ENCOUNTER — Other Ambulatory Visit: Payer: Self-pay | Admitting: Family Medicine

## 2011-03-03 DIAGNOSIS — Z1231 Encounter for screening mammogram for malignant neoplasm of breast: Secondary | ICD-10-CM

## 2011-03-04 ENCOUNTER — Encounter: Payer: Self-pay | Admitting: Family Medicine

## 2011-03-04 ENCOUNTER — Ambulatory Visit (INDEPENDENT_AMBULATORY_CARE_PROVIDER_SITE_OTHER): Payer: Medicare Other | Admitting: Family Medicine

## 2011-03-04 VITALS — BP 126/80 | HR 110 | Temp 98.1°F | Wt 177.0 lb

## 2011-03-04 DIAGNOSIS — S300XXA Contusion of lower back and pelvis, initial encounter: Secondary | ICD-10-CM

## 2011-03-04 DIAGNOSIS — M25571 Pain in right ankle and joints of right foot: Secondary | ICD-10-CM

## 2011-03-04 DIAGNOSIS — M25579 Pain in unspecified ankle and joints of unspecified foot: Secondary | ICD-10-CM

## 2011-03-04 DIAGNOSIS — Z23 Encounter for immunization: Secondary | ICD-10-CM

## 2011-03-04 NOTE — Progress Notes (Signed)
  Subjective:    Patient ID: Tina May, female    DOB: [birth year], 65 y.o.   MRN: [id number]  HPI Here for several things. First she needs a rx for the Zostavax shot so she can get it at her pharmacy. Second, she asks about several years of mild pains in the right lateral ankle. She broke this ankle years ago. No recent trauma. It does not swell or get warm or red. Lastly, she took a 3 hour bike ride about 2 weekends ago, and ever since she has had soreness just below the right buttock.    Review of Systems  Constitutional: Negative.   Musculoskeletal: Positive for arthralgias.       Objective:   Physical Exam  Constitutional: She appears well-developed and well-nourished.  Musculoskeletal:       She is tender over the right ischial tuberosity with no swelling or masses. She is tender just inferior to the right lateral malleolus with full ROM and no edema          Assessment & Plan:  She has some bruising over the ischium from her recent bike ride. Try Aleve bid for a week or two. This should resolve with time. 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 Use Aleve prn and wear an elastic support sleeve prn. Given a rx for the Zostavax shot

## 2011-04-15 ENCOUNTER — Ambulatory Visit
Admission: RE | Admit: 2011-04-15 | Discharge: 2011-04-15 | Disposition: A | Discharge status: Home / Self Care | Payer: Medicare Other | Source: Ambulatory Visit | Attending: Family Medicine | Admitting: Family Medicine

## 2011-04-15 ENCOUNTER — Ambulatory Visit: Payer: Medicare Other

## 2011-04-15 DIAGNOSIS — Z1231 Encounter for screening mammogram for malignant neoplasm of breast: Secondary | ICD-10-CM

## 2011-05-26 ENCOUNTER — Telehealth: Payer: Self-pay | Admitting: *Deleted

## 2011-05-26 MED ORDER — PROMETHAZINE HCL 25 MG PO TABS: 25.0000 mg | ORAL_TABLET | ORAL | Status: AC | PRN

## 2011-05-26 NOTE — Telephone Encounter (Signed)
Call in Phenergan 25 mg tabs q 4 hours prn nausea, #30 with no rf. Use Imodium AD prn. Drink fluids. See [place] if she is not better by tomorrow

## 2011-05-26 NOTE — Telephone Encounter (Signed)
Pt is having diarrhea, vomiting, fever (not sure what ), and headache.  Asking for suggestion on RX.  She cannot get in today to the office.

## 2011-05-26 NOTE — Telephone Encounter (Signed)
Notified pt. 

## 2011-07-21 ENCOUNTER — Telehealth: Payer: Self-pay | Admitting: Family Medicine

## 2011-07-21 NOTE — Telephone Encounter (Signed)
Refill request for Lorazepam 0.5 mg take 1 po q6hrs prn and pt last here on 03/04/11.

## 2011-07-21 NOTE — Telephone Encounter (Signed)
Call in #60 with 5 rf 

## 2011-07-22 MED ORDER — LORAZEPAM 0.5 MG PO TABS: 0.5000 mg | ORAL_TABLET | Freq: Four times a day (QID) | ORAL | Status: DC | PRN

## 2011-07-22 NOTE — Telephone Encounter (Signed)
Script called in

## 2011-11-19 ENCOUNTER — Other Ambulatory Visit: Payer: Self-pay | Admitting: Family Medicine

## 2011-11-19 NOTE — Telephone Encounter (Signed)
Can we refill these? 

## 2011-11-21 ENCOUNTER — Telehealth: Payer: Self-pay | Admitting: Family Medicine

## 2011-11-21 ENCOUNTER — Other Ambulatory Visit: Payer: Self-pay | Admitting: *Deleted

## 2011-11-21 MED ORDER — VALSARTAN-HYDROCHLOROTHIAZIDE 320-25 MG PO TABS: 1.0000 | ORAL_TABLET | Freq: Every day | ORAL | Status: DC

## 2011-11-21 MED ORDER — DILTIAZEM HCL ER COATED BEADS 240 MG PO CP24: 240.0000 mg | ORAL_CAPSULE | Freq: Every day | ORAL | Status: DC

## 2011-11-21 NOTE — Telephone Encounter (Signed)
I sent script e-scribe. 

## 2011-11-21 NOTE — Telephone Encounter (Signed)
Call in one year supply  

## 2011-11-21 NOTE — Telephone Encounter (Signed)
Refill request for Diltiazem 24 hour ER 240 mg take 1 po qd and pt last here on 03/04/11.

## 2011-12-24 ENCOUNTER — Encounter: Payer: Self-pay | Admitting: Family Medicine

## 2011-12-24 ENCOUNTER — Ambulatory Visit (INDEPENDENT_AMBULATORY_CARE_PROVIDER_SITE_OTHER): Payer: Medicare Other | Admitting: Family Medicine

## 2011-12-24 VITALS — BP 126/78 | HR 87 | Temp 98.5°F | Wt 178.0 lb

## 2011-12-24 DIAGNOSIS — J45909 Unspecified asthma, uncomplicated: Secondary | ICD-10-CM

## 2011-12-24 DIAGNOSIS — I10 Essential (primary) hypertension: Secondary | ICD-10-CM

## 2011-12-24 MED ORDER — ALBUTEROL SULFATE HFA 108 (90 BASE) MCG/ACT IN AERS: 2.0000 | INHALATION_SPRAY | RESPIRATORY_TRACT | Status: DC | PRN

## 2011-12-24 NOTE — Progress Notes (Signed)
  Subjective:    Patient ID: Tina May, female    DOB: 06/25/1945, 66 y.o.   MRN: [id number]  HPI Here for refills on her inhaler. She normally does well while in [place], but she travels to [place] several times a year to visit her daughter. She often has a flare of asthma during these trips. Otherwise doing well, playing golf, etc.    Review of Systems  Constitutional: Negative.   Respiratory: Positive for chest tightness and shortness of breath. Negative for cough and wheezing.   Cardiovascular: Negative.        Objective:   Physical Exam  Constitutional: She appears well-developed and well-nourished.  Cardiovascular: Normal rate, regular rhythm, normal heart sounds and intact distal pulses.   Pulmonary/Chest: Effort normal and breath sounds normal.  Lymphadenopathy:    She has no cervical adenopathy.          Assessment & Plan:  Stable asthma. Refilled Ventolin HFA. No need for maintenance inhalers at this time

## 2012-01-07 ENCOUNTER — Ambulatory Visit (INDEPENDENT_AMBULATORY_CARE_PROVIDER_SITE_OTHER): Payer: Medicare Other | Admitting: Family Medicine

## 2012-01-07 ENCOUNTER — Encounter: Payer: Self-pay | Admitting: Family Medicine

## 2012-01-07 VITALS — BP 124/76 | HR 98 | Temp 98.7°F | Wt 178.0 lb

## 2012-01-07 DIAGNOSIS — B958 Unspecified staphylococcus as the cause of diseases classified elsewhere: Secondary | ICD-10-CM

## 2012-01-07 DIAGNOSIS — K121 Other forms of stomatitis: Secondary | ICD-10-CM

## 2012-01-07 MED ORDER — MAGIC MOUTHWASH W/LIDOCAINE: 5.0000 mL | Freq: Four times a day (QID) | ORAL | Status: DC | PRN

## 2012-01-07 MED ORDER — MUPIROCIN CALCIUM 2 % NA OINT: TOPICAL_OINTMENT | Freq: Three times a day (TID) | NASAL | Status: DC

## 2012-01-07 NOTE — Progress Notes (Signed)
  Subjective:    Patient ID: Tina May, female    DOB: November 28, 1945, 66 y.o.   MRN: [id number]  HPI Here for several months of itching and burning on the tongue and in the mouth. Now she has also had one week of sores inside both nostrils. No fever or cough or ST.    Review of Systems  Constitutional: Negative.   HENT: Positive for mouth sores. Negative for ear pain, nosebleeds, congestion, sore throat, rhinorrhea, sneezing, postnasal drip and sinus pressure.   Respiratory: Negative.        Objective:   Physical Exam  Constitutional: She appears well-developed and well-nourished.  HENT:  Right Ear: External ear normal.  Left Ear: External ear normal.  Mouth/Throat: No oropharyngeal exudate.       Both anterior nares have red scaly lesions along the septum   Eyes: Conjunctivae are normal.  Neck: Neck supple. No thyromegaly present.  Pulmonary/Chest: Effort normal and breath sounds normal.  Lymphadenopathy:    She has no cervical adenopathy.          Assessment & Plan:  This is a Staph infection in the nose, so try Bactroban ointment. Try magic mouthwash for the oral irritation.

## 2012-01-19 ENCOUNTER — Telehealth: Payer: Self-pay | Admitting: Obstetrics and Gynecology

## 2012-01-19 NOTE — Telephone Encounter (Signed)
FAx received to RF Premarin.  TC to pt. States is only taking q 3 days. Denies any new medical issues. Annual scheduled 02/18/12.  Premarin 625 mg # 30 0 RF faxed to [facility]. [facility] per protocol.

## 2012-02-09 ENCOUNTER — Telehealth: Payer: Self-pay | Admitting: Family Medicine

## 2012-02-09 ENCOUNTER — Ambulatory Visit (INDEPENDENT_AMBULATORY_CARE_PROVIDER_SITE_OTHER): Payer: Medicare Other | Admitting: Family Medicine

## 2012-02-09 ENCOUNTER — Encounter: Payer: Self-pay | Admitting: Family Medicine

## 2012-02-09 VITALS — BP 124/76 | HR 108 | Temp 98.4°F | Wt 177.0 lb

## 2012-02-09 DIAGNOSIS — K5732 Diverticulitis of large intestine without perforation or abscess without bleeding: Secondary | ICD-10-CM

## 2012-02-09 MED ORDER — CIPROFLOXACIN HCL 500 MG PO TABS: 500.0000 mg | ORAL_TABLET | Freq: Two times a day (BID) | ORAL | Status: DC

## 2012-02-09 MED ORDER — METRONIDAZOLE 500 MG PO TABS: 500.0000 mg | ORAL_TABLET | Freq: Three times a day (TID) | ORAL | Status: DC

## 2012-02-09 NOTE — Telephone Encounter (Signed)
Caller: Britiany/Patient; Patient Name: Tina May; PCP: Gershon Crane [facility]); Best Callback Phone Number: [phone]; Reason for call: Diverticulitis flare.  States she did eat some nuts, and that precipitates a flare up.  States she is having fever, chills, diarrhea, and abdominal cramps.  Onset 02/06/12.  States she had trouble navigating the RN callback system and missed the deadline for an appointment 02/06/12.  Has not improved.  Denies bloody stools.  Per protocol, advised appointment within 4 hours; appointment scheduled first available with Dr. Clent Ridges, 1045 02/09/12.

## 2012-02-09 NOTE — Progress Notes (Signed)
  Subjective:    Patient ID: Tina May, female    DOB: 05-25-1945, 66 y.o.   MRN: [id number]  HPI Here for 5 days of LLQ abdominal pain and diarrhea. No fever or nausea. No urinary symptoms.    Review of Systems  Constitutional: Negative.   Respiratory: Negative.   Cardiovascular: Negative.   Gastrointestinal: Positive for abdominal pain. Negative for nausea, vomiting, constipation, blood in stool, abdominal distention and rectal pain.  Genitourinary: Negative.        Objective:   Physical Exam  Constitutional: She appears well-developed and well-nourished.  Abdominal: Soft. Bowel sounds are normal. She exhibits no distension and no mass. There is tenderness. There is no rebound and no guarding.       Tender in the LLQ           Assessment & Plan:  She will follow up  prn.

## 2012-02-18 ENCOUNTER — Ambulatory Visit (INDEPENDENT_AMBULATORY_CARE_PROVIDER_SITE_OTHER): Payer: Medicare Other | Admitting: Obstetrics and Gynecology

## 2012-02-18 ENCOUNTER — Encounter: Payer: Self-pay | Admitting: Obstetrics and Gynecology

## 2012-02-18 VITALS — BP 102/60 | HR 78 | Ht 62.0 in | Wt 177.0 lb

## 2012-02-18 DIAGNOSIS — N951 Menopausal and female climacteric states: Secondary | ICD-10-CM

## 2012-02-18 DIAGNOSIS — Z01419 Encounter for gynecological examination (general) (routine) without abnormal findings: Secondary | ICD-10-CM

## 2012-02-18 MED ORDER — ESTROGENS CONJUGATED 0.625 MG PO TABS: 0.6250 mg | ORAL_TABLET | Freq: Every day | ORAL | Status: DC

## 2012-02-18 NOTE — Progress Notes (Signed)
Subjective:    Tina May is a 66 y.o. female G3P3 who presents for annual exam. The patient complaints of occasional hot flashes. She is status post hysterectomy.  The following portions of the patient's history were reviewed and updated as appropriate: allergies, current medications, past family history, past medical history, past social history, past surgical history and problem list.  Review of Systems Pertinent items are noted in HPI. Gastrointestinal:No change in bowel habits, no abdominal pain, no rectal bleeding Genitourinary:negative for dysuria, frequency, hematuria, nocturia and urinary incontinence    Objective:     BP 102/60  Pulse 78  Ht 5\' 2"  (1.575 m)  Wt 177 lb (80.287 kg)  BMI 32.37 kg/m2  Weight:  Wt Readings from Last 1 Encounters:  02/18/12 177 lb (80.287 kg)     BMI: Body mass index is 32.37 kg/(m^2). General Appearance: Alert, appropriate appearance for age. No acute distress HEENT: Grossly normal Neck / Thyroid: Supple, no masses, nodes or enlargement Lungs: clear to auscultation bilaterally Back: No CVA tenderness Breast Exam: No masses or nodes.No dimpling, nipple retraction or discharge. Cardiovascular: Regular rate and rhythm. S1, S2, no murmur Gastrointestinal: Soft, non-tender, no masses or organomegaly  ++++++++++++++++++++++++++++++++++++++++++++++++++++++++  Pelvic Exam: External genitalia: normal general appearance Vaginal: normal without tenderness, induration or masses, atrophic mucosa and relaxation noted Cervix: absent Adnexa: normal bimanual exam Uterus: absent Rectovaginal: normal rectal, no masses  ++++++++++++++++++++++++++++++++++++++++++++++++++++++++  Lymphatic Exam: Non-palpable nodes in neck, clavicular, axillary, or inguinal regions  Psychiatric: Alert and oriented, appropriate affect.      Assessment:    Normal gyn exam   Overweight or obese: Yes  Pelvic relaxation: Yes  Menopausal symptoms: Yes. Severe:  Yes.   Plan:    Mammogram. bone density scan every 2 years   Premarin 0.625 mg (patient says she takes one tablet every 3-4 days).  Follow-up:  for annual exam  Kegel exercises discussed: Yes.  Proper diet and regular exercise were reviewed.  Annual mammograms recommended starting at age 61. Proper breast care was discussed.  Screening colonoscopy is recommended beginning at age 46.  Regular health maintenance was reviewed.  Sleep hygiene was discussed.  Adequate calcium and vitamin D intake was emphasized.  Leonard Schwartz M.D.   Regular Periods: no Mammogram: yes  Monthly Breast Ex.: no Exercise: yes  Tetanus < 10 years: yes Seatbelts: yes  NI. Bladder Functn.: yes Abuse at home: no  Daily BM's: yes Stressful Work: no  Healthy Diet: yes Sigmoid-Colonoscopy: between 5-7 yrs ago  Calcium: yes in mulitivitamin Medical problems this year: diverticulitis   LAST PAP:08/2007  Contraception: partial hysterectomy/post menopausal  Mammogram:  04/17/11 wnl  PCP: Dr. Gershon Crane  PMH: none  FMH: none  Last Bone Scan: 1.5 years ago per pt

## 2012-03-10 ENCOUNTER — Other Ambulatory Visit: Payer: Self-pay | Admitting: Obstetrics and Gynecology

## 2012-03-10 DIAGNOSIS — Z1231 Encounter for screening mammogram for malignant neoplasm of breast: Secondary | ICD-10-CM

## 2012-03-22 ENCOUNTER — Other Ambulatory Visit: Payer: Self-pay | Admitting: Family Medicine

## 2012-03-25 ENCOUNTER — Ambulatory Visit: Payer: Medicare Other

## 2012-07-19 ENCOUNTER — Other Ambulatory Visit: Payer: Self-pay | Admitting: Family Medicine

## 2012-08-09 ENCOUNTER — Other Ambulatory Visit: Payer: Self-pay | Admitting: Family Medicine

## 2012-08-10 NOTE — Telephone Encounter (Signed)
I called in script 

## 2012-08-10 NOTE — Telephone Encounter (Signed)
Call in #60 with 5 rf 

## 2012-09-18 ENCOUNTER — Other Ambulatory Visit: Payer: Self-pay | Admitting: Family Medicine

## 2012-09-24 ENCOUNTER — Other Ambulatory Visit: Payer: Self-pay | Admitting: Family Medicine

## 2012-10-08 ENCOUNTER — Encounter: Payer: Self-pay | Admitting: Family Medicine

## 2012-10-08 ENCOUNTER — Ambulatory Visit (INDEPENDENT_AMBULATORY_CARE_PROVIDER_SITE_OTHER): Payer: Medicare Other | Admitting: Family Medicine

## 2012-10-08 VITALS — BP 134/86 | HR 87 | Temp 97.8°F | Wt 178.0 lb

## 2012-10-08 DIAGNOSIS — S239XXA Sprain of unspecified parts of thorax, initial encounter: Secondary | ICD-10-CM

## 2012-10-08 DIAGNOSIS — S29019A Strain of muscle and tendon of unspecified wall of thorax, initial encounter: Secondary | ICD-10-CM

## 2012-10-08 NOTE — Progress Notes (Signed)
  Subjective:    Patient ID: Tina May, female    DOB: 25-Dec-1945, 67 y.o.   MRN: [id number]  HPI Here for 2 days of pain in the left middle back. She thinks it may have come from giving her labrador a bath. Advil helps. No urinary or bowel problems.   Review of Systems  Constitutional: Negative.   Gastrointestinal: Negative.   Genitourinary: Negative.   Musculoskeletal: Positive for back pain.       Objective:   Physical Exam  Constitutional: She appears well-developed and well-nourished. No distress.  Pulmonary/Chest: Effort normal and breath sounds normal.  Abdominal: Soft. Bowel sounds are normal. She exhibits no distension and no mass. There is no tenderness. There is no rebound and no guarding.  Musculoskeletal:  Tender over the left middle back just under the ribs, full ROM          Assessment & Plan:  Muscular strain. Rest, heat, Advil prn

## 2012-11-21 ENCOUNTER — Other Ambulatory Visit: Payer: Self-pay | Admitting: Family Medicine

## 2013-01-20 ENCOUNTER — Other Ambulatory Visit: Payer: Self-pay | Admitting: Family Medicine

## 2013-01-20 NOTE — Telephone Encounter (Signed)
Can we refill this? 

## 2013-01-21 NOTE — Telephone Encounter (Signed)
No refills until she has labs. Set up fasting cpx labs soon, to be followed by a cpx

## 2013-01-23 ENCOUNTER — Other Ambulatory Visit: Payer: Self-pay | Admitting: Family Medicine

## 2013-02-25 ENCOUNTER — Ambulatory Visit (INDEPENDENT_AMBULATORY_CARE_PROVIDER_SITE_OTHER): Payer: Medicare Other | Admitting: Family Medicine

## 2013-02-25 ENCOUNTER — Encounter: Payer: Self-pay | Admitting: Family Medicine

## 2013-02-25 VITALS — BP 126/70 | HR 93 | Temp 98.2°F | Wt 179.0 lb

## 2013-02-25 DIAGNOSIS — M722 Plantar fascial fibromatosis: Secondary | ICD-10-CM

## 2013-02-25 MED ORDER — DICLOFENAC SODIUM 75 MG PO TBEC: 75.0000 mg | DELAYED_RELEASE_TABLET | Freq: Two times a day (BID) | ORAL | Status: DC

## 2013-02-25 NOTE — Progress Notes (Signed)
  Subjective:    Patient ID: Tina May, female    DOB: [birth year], 67 y.o.   MRN: [id number]  HPI Here for 6 months of pain in the bottoms of her feet and in both ankles. This started when she painted a house all day for 3 days wearing flip flops. Using Aleve. She has switched to wearing better shoes and is using OTC arch inserts in the shoes.    Review of Systems  Constitutional: Negative.   Musculoskeletal: Positive for arthralgias.       Objective:   Physical Exam  Constitutional: She appears well-developed and well-nourished.  Musculoskeletal:  Tender along the arches of both feet, also tender throughout the entire ankles of both feet. Mild swelling. No erythema or warmth          Assessment & Plan:  Try Diclofenac.

## 2013-03-11 ENCOUNTER — Encounter (HOSPITAL_COMMUNITY): Payer: Self-pay | Admitting: Emergency Medicine

## 2013-03-11 ENCOUNTER — Emergency Department (HOSPITAL_COMMUNITY)
Admission: EM | Admit: 2013-03-11 | Discharge: 2013-03-12 | Disposition: A | Discharge status: Home / Self Care | Payer: Medicare Other | Attending: Emergency Medicine | Admitting: Emergency Medicine

## 2013-03-11 DIAGNOSIS — Z7982 Long term (current) use of aspirin: Secondary | ICD-10-CM | POA: Insufficient documentation

## 2013-03-11 DIAGNOSIS — K5289 Other specified noninfective gastroenteritis and colitis: Secondary | ICD-10-CM | POA: Insufficient documentation

## 2013-03-11 DIAGNOSIS — Z79899 Other long term (current) drug therapy: Secondary | ICD-10-CM | POA: Insufficient documentation

## 2013-03-11 DIAGNOSIS — J45909 Unspecified asthma, uncomplicated: Secondary | ICD-10-CM | POA: Insufficient documentation

## 2013-03-11 DIAGNOSIS — E785 Hyperlipidemia, unspecified: Secondary | ICD-10-CM | POA: Insufficient documentation

## 2013-03-11 DIAGNOSIS — Z791 Long term (current) use of non-steroidal anti-inflammatories (NSAID): Secondary | ICD-10-CM | POA: Insufficient documentation

## 2013-03-11 DIAGNOSIS — M129 Arthropathy, unspecified: Secondary | ICD-10-CM | POA: Insufficient documentation

## 2013-03-11 DIAGNOSIS — K869 Disease of pancreas, unspecified: Secondary | ICD-10-CM

## 2013-03-11 DIAGNOSIS — K644 Residual hemorrhoidal skin tags: Secondary | ICD-10-CM | POA: Insufficient documentation

## 2013-03-11 DIAGNOSIS — M722 Plantar fascial fibromatosis: Secondary | ICD-10-CM | POA: Insufficient documentation

## 2013-03-11 DIAGNOSIS — K529 Noninfective gastroenteritis and colitis, unspecified: Secondary | ICD-10-CM

## 2013-03-11 DIAGNOSIS — R319 Hematuria, unspecified: Secondary | ICD-10-CM | POA: Insufficient documentation

## 2013-03-11 DIAGNOSIS — Z90710 Acquired absence of both cervix and uterus: Secondary | ICD-10-CM | POA: Insufficient documentation

## 2013-03-11 DIAGNOSIS — IMO0002 Reserved for concepts with insufficient information to code with codable children: Secondary | ICD-10-CM | POA: Insufficient documentation

## 2013-03-11 DIAGNOSIS — Z87891 Personal history of nicotine dependence: Secondary | ICD-10-CM | POA: Insufficient documentation

## 2013-03-11 DIAGNOSIS — I10 Essential (primary) hypertension: Secondary | ICD-10-CM | POA: Insufficient documentation

## 2013-03-11 HISTORY — DX: Disease of pancreas, unspecified: K86.9

## 2013-03-11 NOTE — ED Notes (Signed)
Pt arrived to Ed with a complaint of rectal bleeding. Pt began a new medicine regiment that began two weeks ago.  Pt also has blood in her urine.  Pt has a history of diverticulitis.  Pt states that bleeding has just started today.

## 2013-03-11 NOTE — ED Notes (Signed)
Pt states that starting today she has noticed pink in her urine.  Also pt has had diarrhea x 7 today all with a pink tinge.  Pt is A&O x3, c/o pain 8/10 in lower abdomen.  Pt started taking voltaren 10/24 and believes this may have contributed.

## 2013-03-12 ENCOUNTER — Emergency Department (HOSPITAL_COMMUNITY): Payer: Medicare Other

## 2013-03-12 LAB — URINALYSIS, ROUTINE W REFLEX MICROSCOPIC
Bilirubin Urine: NEGATIVE
Hgb urine dipstick: NEGATIVE
Ketones, ur: NEGATIVE mg/dL
Nitrite: NEGATIVE
pH: 5.5 (ref 5.0–8.0)

## 2013-03-12 LAB — TYPE AND SCREEN
ABO/RH(D): A POS
Antibody Screen: NEGATIVE

## 2013-03-12 LAB — COMPREHENSIVE METABOLIC PANEL
ALT: 33 U/L (ref 0–35)
CO2: 28 mEq/L (ref 19–32)
Calcium: 10 mg/dL (ref 8.4–10.5)
Chloride: 95 mEq/L — ABNORMAL LOW (ref 96–112)
Creatinine, Ser: 0.84 mg/dL (ref 0.50–1.10)
GFR calc Af Amer: 82 mL/min — ABNORMAL LOW (ref >90)
GFR calc non Af Amer: 70 mL/min — ABNORMAL LOW (ref >90)
Glucose, Bld: 130 mg/dL — ABNORMAL HIGH (ref 70–99)
Sodium: 136 mEq/L (ref 135–145)
Total Bilirubin: 0.2 mg/dL — ABNORMAL LOW (ref 0.3–1.2)

## 2013-03-12 LAB — CBC WITH DIFFERENTIAL/PLATELET
Eosinophils Relative: 1 % (ref 0–5)
HCT: 40.5 % (ref 36.0–46.0)
Lymphocytes Relative: 12 % (ref 12–46)
Lymphs Abs: 1.8 10*3/uL (ref 0.7–4.0)
MCV: 93.1 fL (ref 78.0–100.0)
Monocytes Absolute: 0.9 10*3/uL (ref 0.1–1.0)
RBC: 4.35 MIL/uL (ref 3.87–5.11)
WBC: 15.8 10*3/uL — ABNORMAL HIGH (ref 4.0–10.5)

## 2013-03-12 LAB — OCCULT BLOOD, POC DEVICE: Fecal Occult Bld: NEGATIVE

## 2013-03-12 LAB — ABO/RH: ABO/RH(D): A POS

## 2013-03-12 LAB — PROTIME-INR: INR: 0.84 (ref 0.00–1.49)

## 2013-03-12 MED ORDER — METRONIDAZOLE 500 MG PO TABS
500.0000 mg | ORAL_TABLET | Freq: Once | ORAL | Status: AC
  Administered 2013-03-12: 500 mg via ORAL
  Filled 2013-03-12: qty 1

## 2013-03-12 MED ORDER — CIPROFLOXACIN HCL 500 MG PO TABS: 500.0000 mg | ORAL_TABLET | Freq: Two times a day (BID) | ORAL | Status: DC

## 2013-03-12 MED ORDER — IOHEXOL 300 MG/ML  SOLN
50.0000 mL | Freq: Once | INTRAMUSCULAR | Status: AC | PRN
  Administered 2013-03-12: 50 mL via ORAL

## 2013-03-12 MED ORDER — CIPROFLOXACIN HCL 500 MG PO TABS
500.0000 mg | ORAL_TABLET | Freq: Once | ORAL | Status: AC
  Administered 2013-03-12: 500 mg via ORAL
  Filled 2013-03-12: qty 1

## 2013-03-12 MED ORDER — SODIUM CHLORIDE 0.9 % IV BOLUS (SEPSIS)
500.0000 mL | Freq: Once | INTRAVENOUS | Status: AC
  Administered 2013-03-12: 500 mL via INTRAVENOUS

## 2013-03-12 MED ORDER — IOHEXOL 300 MG/ML  SOLN
100.0000 mL | Freq: Once | INTRAMUSCULAR | Status: AC | PRN
  Administered 2013-03-12: 100 mL via INTRAVENOUS

## 2013-03-12 MED ORDER — METRONIDAZOLE 500 MG PO TABS: 500.0000 mg | ORAL_TABLET | Freq: Two times a day (BID) | ORAL | Status: DC

## 2013-03-12 NOTE — ED Provider Notes (Signed)
CSN: [id number]     Arrival date & time 03/11/13  2311 History   First MD Initiated Contact with Patient 03/11/13 2359     Chief Complaint  Patient presents with  . Rectal Bleeding   (Consider location/radiation/quality/duration/timing/severity/associated sxs/prior Treatment) HPI Patient was recently started on diclofenac for plantar fasciitis. She's had several days of abdominal cramping and diarrhea. Patient states today the pain in her lower abdomen became worse and she has noticed blood in her stool. She states her urine is also pinkish in color. Patient denies any fevers or chills. She does have some mild nausea but no vomiting. She denies any chest pain or shortness of breath. Patient denies any other NSAID use. She does have a history of diverticulitis in the past Past Medical History  Diagnosis Date  . Allergy   . Hyperlipidemia   . Hypertension   . Arthritis   . Asthma    Past Surgical History  Procedure Laterality Date  . Abdominal hysterectomy    . Tumor removal      from small intestine   Family History  Problem Relation Age of Onset  . Hyperlipidemia    . Hypertension    . Cancer      prostate/fhx   History  Substance Use Topics  . Smoking status: Former Games developer  . Smokeless tobacco: Never Used  . Alcohol Use: 0.0 oz/week     Comment: glass of wine each day   OB History   Grav Para Term Preterm Abortions TAB SAB Ect Mult Living   3 3        3      Review of Systems  Constitutional: Negative for fever and chills.  Respiratory: Negative for shortness of breath.   Cardiovascular: Negative for chest pain.  Gastrointestinal: Positive for nausea, abdominal pain, diarrhea and blood in stool. Negative for vomiting and constipation.  Genitourinary: Positive for hematuria. Negative for dysuria, frequency, flank pain, vaginal bleeding, vaginal discharge, difficulty urinating and vaginal pain.  Musculoskeletal: Negative for back pain, myalgias, neck pain and neck  stiffness.  Skin: Negative for rash and wound.  Neurological: Negative for dizziness, weakness, light-headedness, numbness and headaches.  All other systems reviewed and are negative.    Allergies  Atorvastatin and Morphine and related  Home Medications   Current Outpatient Rx  Name  Route  Sig  Dispense  Refill  . albuterol (VENTOLIN HFA) 108 (90 BASE) MCG/ACT inhaler   Inhalation   Inhale 2 puffs into the lungs every 4 (four) hours as needed for wheezing or shortness of breath.   1 Inhaler   11   . aspirin 81 MG tablet   Oral   Take 81 mg by mouth daily.           . Black Cohosh (REMIFEMIN) 20 MG TABS   Oral   Take 1 tablet by mouth 2 (two) times daily.         . Coenzyme Q10 50 MG CAPS   Oral   Take by mouth daily.           . diclofenac (VOLTAREN) 75 MG EC tablet   Oral   Take 1 tablet (75 mg total) by mouth 2 (two) times daily.   60 tablet   5   . diltiazem (CARDIZEM CD) 240 MG 24 hr capsule      TAKE 1 CAPSULE (240 MG TOTAL) BY MOUTH DAILY.   30 capsule   3   . Ibuprofen-Diphenhydramine Cit (ADVIL PM PO)  Oral   Take by mouth at bedtime as needed.         [name] LORazepam (ATIVAN) 0.5 MG tablet      TAKE 1 TABLET BY MOUTH EVERY 6 HOURS AS NEEDED   60 tablet   5   . Melatonin 10 MG TABS   Oral   Take 1 tablet by mouth at bedtime.         . Multiple Vitamin (MULTIVITAMIN) tablet   Oral   Take 1 tablet by mouth daily.           . naproxen sodium (ANAPROX) 220 MG tablet   Oral   Take 220 mg by mouth daily.          . rosuvastatin (CRESTOR) 5 MG tablet   Oral   Take 1 tablet (5 mg total) by mouth daily.   30 tablet   11   . valsartan-hydrochlorothiazide (DIOVAN-HCT) 320-25 MG per tablet      TAKE 1 TABLET BY MOUTH DAILY.   30 tablet   3   . vitamin E 200 UNIT capsule   Oral   Take 200 Units by mouth daily.            BP 150/81  Pulse 107  Temp(Src) 98.2 F (36.8 C) (Oral)  Resp 18  SpO2 96% Physical Exam  Nursing  note and vitals reviewed. Constitutional: She is oriented to person, place, and time. She appears well-developed and well-nourished. No distress.  HENT:  Head: Normocephalic and atraumatic.  Mouth/Throat: Oropharynx is clear and moist.  Eyes: EOM are normal. Pupils are equal, round, and reactive to light.  Neck: Normal range of motion. Neck supple.  Cardiovascular: Normal rate and regular rhythm.   Pulmonary/Chest: Effort normal and breath sounds normal. No respiratory distress. She has no wheezes. She has no rales.  Abdominal: Soft. Bowel sounds are normal. She exhibits no distension and no mass. There is tenderness (tenderness to palpation in the right lower quadrant greater than left lower quadrant. No rebound or guarding. Patient has mild amount of tenderness to palpation in the epigastrium.). There is no rebound and no guarding.  Genitourinary: Guaiac negative stool.  External hemorrhoids noted. Brown stool. Guaiac negative.  Musculoskeletal: Normal range of motion. She exhibits no edema and no tenderness.  Neurological: She is alert and oriented to person, place, and time.  Patient is alert and oriented x3 with clear, goal oriented speech. Patient has 5/5 motor in all extremities. Sensation is intact to light touch.    Skin: Skin is warm and dry. No rash noted. No erythema.  Psychiatric: She has a normal mood and affect. Her behavior is normal.    ED Course  Procedures (including critical care time) Labs Review Labs Reviewed  CBC WITH DIFFERENTIAL  COMPREHENSIVE METABOLIC PANEL  LIPASE, BLOOD  PROTIME-INR  URINALYSIS, ROUTINE W REFLEX MICROSCOPIC  APTT  OCCULT BLOOD, POC DEVICE  TYPE AND SCREEN   Imaging Review No results found.  EKG Interpretation   None       MDM  Patient states her pain is significantly improved. CT reveals diffuse colitis consistent with her exam and mild elevation in her white blood cell count. Patient was quite negative and she has a stable  hemoglobin. Advised to followup with her primary Dr. who can then refer her onto a gastroenterologist should her symptoms not be improving. I've given her return precautions and she and her husband have both voiced understanding.   Loren Racer, MD 03/12/13 [phone]

## 2013-03-17 ENCOUNTER — Encounter: Payer: Self-pay | Admitting: Family Medicine

## 2013-03-17 ENCOUNTER — Ambulatory Visit (INDEPENDENT_AMBULATORY_CARE_PROVIDER_SITE_OTHER): Payer: Medicare Other | Admitting: Family Medicine

## 2013-03-17 VITALS — BP 112/70 | HR 98 | Temp 98.1°F | Wt 178.0 lb

## 2013-03-17 DIAGNOSIS — K5289 Other specified noninfective gastroenteritis and colitis: Secondary | ICD-10-CM

## 2013-03-17 DIAGNOSIS — K529 Noninfective gastroenteritis and colitis, unspecified: Secondary | ICD-10-CM

## 2013-03-17 DIAGNOSIS — M722 Plantar fascial fibromatosis: Secondary | ICD-10-CM

## 2013-03-17 NOTE — Progress Notes (Signed)
  Subjective:    Patient ID: Tina May, female    DOB: 1945-07-22, 67 y.o.   MRN: [id number]  HPI Here to follow up an ER visit on 03-11-13 and for plantar fasciitis. We saw her a month ago and started her on Diclofenac and arch supports. This was very successful and her foot pain was completely under control. However she then had the sudden onset of fever, vomiting, abdominal cramps, and bloody diarrhea. She had a WBC of 15.8 and a CT scan revealed inflammation of the colon. No cultures were obtained. She was put on 10 days of Cipro and Flagyl, and she was told to not take the Diclofenac. Since then her abdominal pain and the fever have resolved and her appetite is back to normal. She still has some diarrhea. Of course her feet are hurting again.    Review of Systems  Constitutional: Negative.   Respiratory: Negative.   Cardiovascular: Negative.   Gastrointestinal: Positive for diarrhea. Negative for nausea, vomiting, abdominal pain, constipation, blood in stool, abdominal distention and rectal pain.       Objective:   Physical Exam  Constitutional: She appears well-developed and well-nourished. No distress.  Abdominal: Soft. Bowel sounds are normal. She exhibits no distension and no mass. There is no tenderness. There is no rebound and no guarding.          Assessment & Plan:  The colitis seems to be resolving. She will finish up the antibiotics. Stay off anti-inflammatory meds for now. Let me know how she is doing in one week.

## 2013-03-17 NOTE — Progress Notes (Signed)
Pre visit review using our clinic review tool, if applicable. No additional management support is needed unless otherwise documented below in the visit note. 

## 2013-03-25 ENCOUNTER — Telehealth: Payer: Self-pay | Admitting: Family Medicine

## 2013-03-25 MED ORDER — DICLOFENAC SODIUM 75 MG PO TBEC: 75.0000 mg | DELAYED_RELEASE_TABLET | Freq: Two times a day (BID) | ORAL | Status: DC

## 2013-03-25 NOTE — Telephone Encounter (Signed)
That is good news. Tell her she can go back to work and follow up prn

## 2013-03-25 NOTE — Telephone Encounter (Signed)
Call in Diclofenac 75 mg bid, #60 with 11 rf

## 2013-03-25 NOTE — Telephone Encounter (Signed)
I spoke with pt and sent script e-scribe. 

## 2013-03-25 NOTE — Telephone Encounter (Signed)
Pt wants to know if you are going to put her back on Voltaren?

## 2013-03-25 NOTE — Telephone Encounter (Signed)
Pt was to let dr fry know how she is doing today and then he will make decision on her going back onto her feet med. Pt states she is doing great!! No side effects, she is totally normal!!

## 2013-03-26 ENCOUNTER — Other Ambulatory Visit: Payer: Self-pay | Admitting: Family Medicine

## 2013-04-05 ENCOUNTER — Encounter: Payer: Self-pay | Admitting: Family Medicine

## 2013-04-05 ENCOUNTER — Ambulatory Visit (INDEPENDENT_AMBULATORY_CARE_PROVIDER_SITE_OTHER): Payer: Medicare Other | Admitting: Family Medicine

## 2013-04-05 VITALS — BP 120/62 | HR 102 | Temp 98.2°F | Wt 179.0 lb

## 2013-04-05 DIAGNOSIS — M25579 Pain in unspecified ankle and joints of unspecified foot: Secondary | ICD-10-CM

## 2013-04-05 NOTE — Progress Notes (Signed)
   Subjective:    Patient ID: Tina May, female    DOB: 1945-12-20, 67 y.o.   MRN: [id number]  HPI Here for several months of pain in the arches of the feet. She was diagnosed with plantar fasciitis and she took NSAIDs for awhile. These worked well but they irritated her GI tract and she has had cramps and diarrhea several times. Therefore she stopped taking these. Now she has pain in both ankles and both feet.    Review of Systems  Constitutional: Negative.   Musculoskeletal: Positive for arthralgias and joint swelling.       Objective:   Physical Exam  Constitutional: She appears well-developed and well-nourished.  Musculoskeletal:  Both ankles are puffy and very tender, no warmth or erythema. Tender along both arches           Assessment & Plan:  She needs to avoid taking NSAIDs. Refer to Orthopedics.

## 2013-06-01 ENCOUNTER — Encounter: Payer: Self-pay | Admitting: Family Medicine

## 2013-06-01 ENCOUNTER — Ambulatory Visit (INDEPENDENT_AMBULATORY_CARE_PROVIDER_SITE_OTHER): Payer: Medicare Other | Admitting: Family Medicine

## 2013-06-01 VITALS — BP 120/70 | HR 93 | Temp 97.4°F | Ht 62.0 in | Wt 180.0 lb

## 2013-06-01 DIAGNOSIS — R109 Unspecified abdominal pain: Secondary | ICD-10-CM

## 2013-06-01 LAB — LIPASE: Lipase: 28 U/L (ref 11.0–59.0)

## 2013-06-01 LAB — POCT URINALYSIS DIPSTICK
Bilirubin, UA: NEGATIVE
Glucose, UA: NEGATIVE
Ketones, UA: NEGATIVE
Leukocytes, UA: NEGATIVE
Nitrite, UA: NEGATIVE
Protein, UA: NEGATIVE
RBC UA: NEGATIVE
Spec Grav, UA: 1.02
UROBILINOGEN UA: 0.2
pH, UA: 5.5

## 2013-06-01 LAB — CBC WITH DIFFERENTIAL/PLATELET
Basophils Absolute: 0 10*3/uL (ref 0.0–0.1)
Basophils Relative: 0.7 % (ref 0.0–3.0)
EOS PCT: 2.5 % (ref 0.0–5.0)
Eosinophils Absolute: 0.2 10*3/uL (ref 0.0–0.7)
HEMATOCRIT: 43.2 % (ref 36.0–46.0)
Hemoglobin: 14 g/dL (ref 12.0–15.0)
LYMPHS ABS: 2.1 10*3/uL (ref 0.7–4.0)
Lymphocytes Relative: 30.8 % (ref 12.0–46.0)
MCHC: 32.4 g/dL (ref 30.0–36.0)
MCV: 96.9 fl (ref 78.0–100.0)
MONO ABS: 0.6 10*3/uL (ref 0.1–1.0)
Monocytes Relative: 8.2 % (ref 3.0–12.0)
Neutro Abs: 3.9 10*3/uL (ref 1.4–7.7)
Neutrophils Relative %: 57.8 % (ref 43.0–77.0)
PLATELETS: 310 10*3/uL (ref 150.0–400.0)
RBC: 4.46 Mil/uL (ref 3.87–5.11)
RDW: 13.7 % (ref 11.5–14.6)
WBC: 6.8 10*3/uL (ref 4.5–10.5)

## 2013-06-01 LAB — HEPATIC FUNCTION PANEL
ALBUMIN: 4.5 g/dL (ref 3.5–5.2)
ALK PHOS: 66 U/L (ref 39–117)
ALT: 34 U/L (ref 0–35)
AST: 24 U/L (ref 0–37)
BILIRUBIN DIRECT: 0 mg/dL (ref 0.0–0.3)
TOTAL PROTEIN: 7.8 g/dL (ref 6.0–8.3)
Total Bilirubin: 0.5 mg/dL (ref 0.3–1.2)

## 2013-06-01 LAB — BASIC METABOLIC PANEL
BUN: 19 mg/dL (ref 6–23)
CALCIUM: 9.8 mg/dL (ref 8.4–10.5)
CHLORIDE: 102 meq/L (ref 96–112)
CO2: 31 meq/L (ref 19–32)
Creatinine, Ser: 0.7 mg/dL (ref 0.4–1.2)
GFR: 85.67 mL/min (ref >60.00)
GLUCOSE: 93 mg/dL (ref 70–99)
Potassium: 4.5 mEq/L (ref 3.5–5.1)
SODIUM: 141 meq/L (ref 135–145)

## 2013-06-01 LAB — AMYLASE: Amylase: 52 U/L (ref 27–131)

## 2013-06-01 LAB — TSH: TSH: 0.83 u[IU]/mL (ref 0.35–5.50)

## 2013-06-01 MED ORDER — CIPROFLOXACIN HCL 500 MG PO TABS: 500.0000 mg | ORAL_TABLET | Freq: Two times a day (BID) | ORAL | Status: DC

## 2013-06-01 NOTE — Progress Notes (Signed)
Pre visit review using our clinic review tool, if applicable. No additional management support is needed unless otherwise documented below in the visit note. 

## 2013-06-01 NOTE — Progress Notes (Signed)
   Subjective:    Patient ID: Tina May, female    DOB: Jun 20, 1945, 68 y.o.   MRN: [id number]  HPI Here for several things. First she has been having hot flashes which is unusual for her. She had been on Premarin for years but then she stopped this 6 months ago. Shortly after that she started having hot flashes again. She then restarted the Premarin 10 days ago but is still having the flashes. Also she has had a week of mild lower abdominal cramps which feel like diverticulitis to her. No fevers. BMs are normal. No urinary sx.    Review of Systems  Constitutional: Positive for diaphoresis. Negative for fever and chills.  HENT: Negative.   Respiratory: Negative.   Cardiovascular: Negative.   Gastrointestinal: Positive for abdominal pain. Negative for nausea, vomiting, constipation, blood in stool, abdominal distention and rectal pain.  Genitourinary: Negative.        Objective:   Physical Exam  Constitutional: She appears well-developed and well-nourished. No distress.  Cardiovascular: Normal rate, regular rhythm, normal heart sounds and intact distal pulses.   Pulmonary/Chest: Effort normal and breath sounds normal.  Abdominal: Soft. Bowel sounds are normal. She exhibits no distension and no mass. There is no rebound and no guarding.  Mildly tender in the LLQ          Assessment & Plan:  Possible early diverticulitis. Start on Cipro. Get labs today. She will stay on the Premarin for now. I told her it is too soon to tell if this will stop the hot flashes or not. Refer to GI for recurrent abdominal pains.

## 2013-06-07 MED ORDER — ROSUVASTATIN CALCIUM 5 MG PO TABS: 5.0000 mg | ORAL_TABLET | Freq: Every day | ORAL | Status: DC

## 2013-06-07 NOTE — Addendum Note (Signed)
Addended by: Aggie Hacker A on: 06/07/2013 12:05 PM   Modules accepted: Orders

## 2013-06-10 ENCOUNTER — Encounter: Payer: Self-pay | Admitting: *Deleted

## 2013-06-10 ENCOUNTER — Encounter: Payer: Self-pay | Admitting: Internal Medicine

## 2013-06-16 ENCOUNTER — Encounter: Payer: Self-pay | Admitting: *Deleted

## 2013-08-02 ENCOUNTER — Other Ambulatory Visit: Payer: Self-pay | Admitting: Family Medicine

## 2013-08-12 ENCOUNTER — Ambulatory Visit: Payer: Medicare Other | Admitting: Internal Medicine

## 2013-09-21 ENCOUNTER — Encounter: Payer: Self-pay | Admitting: Internal Medicine

## 2013-09-23 ENCOUNTER — Encounter: Payer: Self-pay | Admitting: *Deleted

## 2013-10-10 ENCOUNTER — Other Ambulatory Visit: Payer: Self-pay | Admitting: Family Medicine

## 2013-11-25 ENCOUNTER — Ambulatory Visit (INDEPENDENT_AMBULATORY_CARE_PROVIDER_SITE_OTHER): Payer: Medicare Other | Admitting: Internal Medicine

## 2013-11-25 ENCOUNTER — Encounter: Payer: Self-pay | Admitting: Internal Medicine

## 2013-11-25 ENCOUNTER — Other Ambulatory Visit (INDEPENDENT_AMBULATORY_CARE_PROVIDER_SITE_OTHER): Payer: Medicare Other

## 2013-11-25 VITALS — BP 112/70 | HR 100 | Ht 62.0 in | Wt 182.0 lb

## 2013-11-25 DIAGNOSIS — K5289 Other specified noninfective gastroenteritis and colitis: Secondary | ICD-10-CM

## 2013-11-25 DIAGNOSIS — R933 Abnormal findings on diagnostic imaging of other parts of digestive tract: Secondary | ICD-10-CM

## 2013-11-25 LAB — SEDIMENTATION RATE: Sed Rate: 14 mm/hr (ref 0–22)

## 2013-11-25 MED ORDER — MOVIPREP 100 G PO SOLR: 1.0000 | Freq: Once | ORAL | Status: DC

## 2013-11-25 NOTE — Progress Notes (Signed)
Tina May 1945/05/21 [id number]  Note: This dictation was prepared with Dragon digital system. Any transcriptional errors that result from this procedure are unintentional.   History of Present Illness:  This is a 68 year old white female with several discrete episodes of lower abdominal pain; one of them was diagnosed as colitis and the other 2 episodes as diverticulitis. The most recent episode in November 2014 involved acute lower abdominal pain and diarrhea and was treated with Cipro and Flagyl with complete resolution. A CT scan of the abdomen showed a 1.3 cm hypodense lesion in the tail of the pancreas, questionable cyst. It also showed aortic calcification as well as mild thickening of the right and transverse colon c/w acute colitis. TI was normal.. She had a prior hysterectomy and had prominent ovaries on CT scan.. We saw her in April 2009 for a colon screening and she had mild diverticulosis at that time. Since  her colonoscopy, she has  had 2 episodes of acute left lower quadrant abdominal pain treated as  Diverticulitis  In 12/2001with antibiotics with good response. She reports that her bowel habits have not been the same since the episodes of diverticulitis. She also has crampy lower abdominal pain but her bowel habits have been rather regular. There has been no fever or rectal bleeding. There is no family history of colon cancer. The last episode of colitis was attributed to anti-inflammatory agents. She has been on antihypertensives (Diovan, diltiazem). Her blood pressures at home have been running as low as 381 systolic and 49 diastolic. She denies any symptoms suggestive of intestinal angina. 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 Dalbert May. Tina May 04/2000    Past Medical History  Diagnosis Date  . Allergy   . Hyperlipidemia   . Hypertension   . Arthritis   . Asthma   . Diverticulosis   . Hepatic cyst   .  Pancreatic lesion 03/11/13  . External hemorrhoids     Past Surgical History  Procedure Laterality Date  . Abdominal hysterectomy    . Small intestine surgery      from small intestine  . Colonoscopy  08-06-07    per Dr. Olevia May, diverticulae only, repeat in 10 yrs     Allergies  Allergen Reactions  . Atorvastatin     REACTION: muscle cramps  . Morphine And Related Nausea And Vomiting    Family history and social history have been reviewed.  Review of Systems: Denies nausea vomiting or heartburn  The remainder of the 10 point ROS is negative except as outlined in the H&P  Physical Exam: General Appearance Well developed, in no distress mildly overweight Eyes  Non icteric  HEENT  Non traumatic, normocephalic  Mouth No lesion, tongue papillated, no cheilosis Neck Supple without adenopathy, thyroid not enlarged, no carotid bruits, no JVD Lungs Clear to auscultation bilaterally COR Normal S1, normal S2, regular rhythm, no murmur, quiet precordium Abdomen soft, nontender. No active bowel sounds. No tenderness or fullness well healed surgical scar above the umbilicus Rectal small amount of soft Hemoccult negative stool  Extremities  No pedal edema Skin No lesions Neurological Alert and oriented x 3 Psychological Normal mood and affect  Assessment and Plan:   Problem #6 68 year old white female who had an episode of acute colitis about 6 months ago. She is fully recovered although her bowel habits have not been back to normal. She is hemoccult negative. Episodes could be  related to nonsteroidal anti-inflammatory agents or due to ischemic colitis since her blood pressures have been running rather low and she has multiple calcifications in her mesenteric arteries. We will proceed with a colonoscopy to rule out inflammatory bowel disease which is less likely at this time. She will increase her Metamucil caplets from 2 a day to 4 day. She will discuss with Tina May the possibility of  cutting back one of her antihypertensives.dependi ng on the record of the blood pressure measurements from home. In the meantime, she will keep checking her blood pressure.  Problem #2 History of small bowel resection for benign tumor. We will try to obtain records of that.  Problem #3 History of diverticulosis with a  history of diverticulitis on 2 separate occasions since her last colonoscopy in 2009. The colonoscopy report at that time showed only mild diverticular disease. We will proceed with a colonoscopy to assess the severity and possibility of partial colon obstruction. We will be checking  sprue profile today and sedimentation rate.  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 This will have to be followed up with a  CT scan in one year or with EUS.    Delfin Edis 11/25/2013

## 2013-11-25 NOTE — Patient Instructions (Addendum)
You have been scheduled for a colonoscopy. Please follow written instructions given to you at your visit today.  Please pick up your prep kit at the pharmacy within the next 1-3 days. If you use inhalers (even only as needed), please bring them with you on the day of your procedure. Your physician has requested that you go to www.startemmi.com and enter the access code given to you at your visit today. This web site gives a general overview about your procedure. However, you should still follow specific instructions given to you by our office regarding your preparation for the procedure.  Your physician has requested that you go to the basement for the following lab work before leaving today: Celiac 10, Sed Rate  Please purchase the following medications over the counter and take as directed: Metamucil 2 tablets twice daily  Please talk to Dr Sarajane Jews regarding possibly reducing your blood pressure medicines (due to presumed ischemic colitis).  Cc:Dr Sarajane Jews

## 2013-11-26 ENCOUNTER — Encounter: Payer: Self-pay | Admitting: Internal Medicine

## 2013-11-27 ENCOUNTER — Other Ambulatory Visit: Payer: Self-pay | Admitting: Family Medicine

## 2013-11-28 LAB — CELIAC PANEL 10
ENDOMYSIAL SCREEN: NEGATIVE
GLIADIN IGA: 10.6 U/mL (ref <20)
Gliadin IgG: 6 U/mL (ref <20)
IgA: 166 mg/dL (ref 69–380)
TISSUE TRANSGLUT AB: 8.5 U/mL (ref <20)
Tissue Transglutaminase Ab, IgA: 6.4 U/mL (ref <20)

## 2013-11-30 ENCOUNTER — Ambulatory Visit (INDEPENDENT_AMBULATORY_CARE_PROVIDER_SITE_OTHER): Payer: Medicare Other | Admitting: Family Medicine

## 2013-11-30 ENCOUNTER — Encounter: Payer: Self-pay | Admitting: Family Medicine

## 2013-11-30 VITALS — BP 122/72 | HR 82 | Temp 98.8°F | Ht 62.0 in | Wt 180.0 lb

## 2013-11-30 DIAGNOSIS — S239XXD Sprain of unspecified parts of thorax, subsequent encounter: Secondary | ICD-10-CM

## 2013-11-30 DIAGNOSIS — I10 Essential (primary) hypertension: Secondary | ICD-10-CM

## 2013-11-30 DIAGNOSIS — S239XXA Sprain of unspecified parts of thorax, initial encounter: Secondary | ICD-10-CM

## 2013-11-30 DIAGNOSIS — Z5189 Encounter for other specified aftercare: Secondary | ICD-10-CM

## 2013-11-30 MED ORDER — VALSARTAN 160 MG PO TABS: 160.0000 mg | ORAL_TABLET | Freq: Every day | ORAL | Status: DC

## 2013-11-30 MED ORDER — CYCLOBENZAPRINE HCL 10 MG PO TABS: 10.0000 mg | ORAL_TABLET | Freq: Three times a day (TID) | ORAL | Status: DC | PRN

## 2013-11-30 NOTE — Progress Notes (Signed)
   Subjective:    Patient ID: Tina May, female    DOB: 1946/01/26, 68 y.o.   MRN: [id number]  HPI Here for several issues. First about 10 days ago she developed a mild but sharp pain in the right middle back that radiates around the right side to the right flank. Movement makes this worse. It is not affected by eating. No change in urinary or bowel habits. No nausea. She is scheduled for a colonoscopy with Dr. [name] soon. Her BP has been running a little low lately.    Review of Systems  Constitutional: Negative.   Respiratory: Negative.   Cardiovascular: Negative.   Musculoskeletal: Positive for back pain.       Objective:   Physical Exam  Constitutional: She appears well-developed and well-nourished.  Cardiovascular: Normal rate, regular rhythm, normal heart sounds and intact distal pulses.   Pulmonary/Chest: Effort normal and breath sounds normal.  Musculoskeletal:  Tender along the right middle back with some spasm, but ROM is full   Skin: No rash noted.          Assessment & Plan:  We will stop the Valsartan HCT and switch to Valsartan 160 mg daily. She seems to have a pinched nerve in the back. Rest , heat. Try Flexeril prn.

## 2013-11-30 NOTE — Progress Notes (Signed)
Pre visit review using our clinic review tool, if applicable. No additional management support is needed unless otherwise documented below in the visit note. 

## 2013-12-07 ENCOUNTER — Encounter: Payer: Self-pay | Admitting: Internal Medicine

## 2014-01-25 ENCOUNTER — Encounter: Payer: Self-pay | Admitting: Internal Medicine

## 2014-01-25 ENCOUNTER — Ambulatory Visit (AMBULATORY_SURGERY_CENTER): Payer: Medicare Other | Admitting: Internal Medicine

## 2014-01-25 VITALS — BP 134/68 | HR 83 | Temp 96.5°F | Resp 15 | Ht 62.0 in | Wt 182.0 lb

## 2014-01-25 DIAGNOSIS — Z8719 Personal history of other diseases of the digestive system: Secondary | ICD-10-CM

## 2014-01-25 DIAGNOSIS — K5289 Other specified noninfective gastroenteritis and colitis: Secondary | ICD-10-CM

## 2014-01-25 DIAGNOSIS — Z1211 Encounter for screening for malignant neoplasm of colon: Secondary | ICD-10-CM

## 2014-01-25 HISTORY — PX: COLONOSCOPY: SHX174

## 2014-01-25 MED ORDER — SODIUM CHLORIDE 0.9 % IV SOLN: 500.0000 mL | INTRAVENOUS | Status: DC

## 2014-01-25 NOTE — Progress Notes (Signed)
Stable to RR 

## 2014-01-25 NOTE — Patient Instructions (Signed)
Diverticulosis seen today, try to follow high fiber diet. See handouts on high fiber diet and diverticulosis given. Resume current medications. Repeat colonoscopy in 10 years. Call us with any questions or concerns. Thank you!  YOU HAD AN ENDOSCOPIC PROCEDURE TODAY AT [facility]: Refer to the procedure report that was given to you for any specific questions about what was found during the examination.  If the procedure report does not answer your questions, please call your gastroenterologist to clarify.  If you requested that your care partner not be given the details of your procedure findings, then the procedure report has been included in a sealed envelope for you to review at your convenience later.  YOU SHOULD EXPECT: Some feelings of bloating in the abdomen. Passage of more gas than usual.  Walking can help get rid of the air that was put into your GI tract during the procedure and reduce the bloating. If you had a lower endoscopy (such as a colonoscopy or flexible sigmoidoscopy) you may notice spotting of blood in your stool or on the toilet paper. If you underwent a bowel prep for your procedure, then you may not have a normal bowel movement for a few days.  DIET: Your first meal following the procedure should be a light meal and then it is ok to progress to your normal diet.  A half-sandwich or bowl of soup is an example of a good first meal.  Heavy or fried foods are harder to digest and may make you feel nauseous or bloated.  Likewise meals heavy in dairy and vegetables can cause extra gas to form and this can also increase the bloating.  Drink plenty of fluids but you should avoid alcoholic beverages for 24 hours.  ACTIVITY: Your care partner should take you home directly after the procedure.  You should plan to take it easy, moving slowly for the rest of the day.  You can resume normal activity the day after the procedure however you should NOT DRIVE or use heavy machinery  for 24 hours (because of the sedation medicines used during the test).    SYMPTOMS TO REPORT IMMEDIATELY: A gastroenterologist can be reached at any hour.  During normal business hours, 8:30 AM to 5:00 PM Monday through Friday, call [phone].  After hours and on weekends, please call the GI answering service at [phone] who will take a message and have the physician on call contact you.   Following lower endoscopy (colonoscopy or flexible sigmoidoscopy):  Excessive amounts of blood in the stool  Significant tenderness or worsening of abdominal pains  Swelling of the abdomen that is new, acute  Fever of 100F or higher  Following upper endoscopy (EGD)  Vomiting of blood or coffee ground material  New chest pain or pain under the shoulder blades  Painful or persistently difficult swallowing  New shortness of breath  Fever of 100F or higher  Black, tarry-looking stools  FOLLOW UP: If any biopsies were taken you will be contacted by phone or by letter within the next 1-3 weeks.  Call your gastroenterologist if you have not heard about the biopsies in 3 weeks.  Our staff will call the home number listed on your records the next business day following your procedure to check on you and address any questions or concerns that you may have at that time regarding the information given to you following your procedure. This is a courtesy call and so if there is no answer at  the home number and we have not heard from you through the emergency physician on call, we will assume that you have returned to your regular daily activities without incident.  SIGNATURES/CONFIDENTIALITY: You and/or your care partner have signed paperwork which will be entered into your electronic medical record.  These signatures attest to the fact that that the information above on your After Visit Summary has been reviewed and is understood.  Full responsibility of the confidentiality of this discharge information  lies with you and/or your care-partner.

## 2014-01-25 NOTE — Op Note (Signed)
[facility]  [address] [place], [id number]   COLONOSCOPY PROCEDURE REPORT  PATIENT: Tina May, Tina May  MR#: [id number] BIRTHDATE: 02-16-1946 , 68  yrs. old GENDER: female ENDOSCOPIST: Lafayette Dragon, MD REFERRED HM:[id number] Raymon Mutton, M.D. PROCEDURE DATE:  01/25/2014 PROCEDURE:   Colonoscopy, diagnostic First Screening Colonoscopy - Avg.  risk and is 50 yrs.  old or older - No.  Prior Negative Screening - Now for repeat screening. N/A  History of Adenoma - Now for follow-up colonoscopy & has been > or = to 3 yrs.  N/A  Polyps Removed Today? No.  Recommend repeat exam, <10 yrs? Polyps Removed Today? No.  Recommend repeat exam, <10 yrs? No. ASA CLASS:   Class II INDICATIONS:Colonoscopy in April 2009was normal. MEDICATIONS: Monitored anesthesia care and Propofol 300 mg  DESCRIPTION OF PROCEDURE:   After the risks benefits and alternatives of the procedure were thoroughly explained, informed consent was obtained.  The digital rectal exam revealed no abnormalities of the rectum.   The [id number]  endoscope was introduced through the anus and advanced to the cecum, which was identified by both the appendix and ileocecal valve. No adverse events experienced.   The quality of the prep was good, using MoviPrep  The instrument was then slowly withdrawn as the colon was fully examined.      COLON FINDINGS: There was moderate diverticulosis noted in the sigmoid colon with associated muscular hypertrophy, tortuosity and angulation.  Retroflexed views revealed no abnormalities. The time to cecum=13 minutes 13 seconds.  Withdrawal time=6 minutes 07 seconds.  The scope was withdrawn and the procedure completed. COMPLICATIONS: There were no complications.  ENDOSCOPIC IMPRESSION: There was moderate diverticulosis noted in the sigmoid colon complete resolution of acute colitis  RECOMMENDATIONS: High fiber diet Recall colonoscopy in 10 years  eSigned:  Lafayette Dragon, MD  01/25/2014 10:01 AM   cc:

## 2014-01-26 ENCOUNTER — Telehealth: Payer: Self-pay | Admitting: *Deleted

## 2014-01-26 NOTE — Telephone Encounter (Signed)
  Follow up Call-  Call back number 01/25/2014  Post procedure Call Back phone  # [phone]  Permission to leave phone message Yes     Patient questions:  Do you have a fever, pain , or abdominal swelling? No. Pain Score  0 *  Have you tolerated food without any problems? Yes.    Have you been able to return to your normal activities? Yes.    Do you have any questions about your discharge instructions: Diet   No. Medications  No. Follow up visit  No.  Do you have questions or concerns about your Care? No.  Actions: * If pain score is 4 or above: No action needed, pain <4.

## 2014-03-06 ENCOUNTER — Encounter: Payer: Self-pay | Admitting: Internal Medicine

## 2014-03-22 ENCOUNTER — Other Ambulatory Visit: Payer: Self-pay | Admitting: Family Medicine

## 2014-04-21 LAB — HM MAMMOGRAPHY

## 2014-06-21 ENCOUNTER — Other Ambulatory Visit: Payer: Self-pay | Admitting: Family Medicine

## 2014-06-21 NOTE — Telephone Encounter (Signed)
Is pt due for lipid labs?

## 2014-08-02 ENCOUNTER — Other Ambulatory Visit: Payer: Self-pay | Admitting: Family Medicine

## 2014-08-07 ENCOUNTER — Telehealth: Payer: Self-pay | Admitting: Family Medicine

## 2014-08-07 NOTE — Telephone Encounter (Signed)
crestor 5 mg #30 w/refills sent to [facility]

## 2014-08-08 NOTE — Telephone Encounter (Signed)
Rx was sent 06/22/14 #30 with 11 refills

## 2014-09-05 ENCOUNTER — Ambulatory Visit (INDEPENDENT_AMBULATORY_CARE_PROVIDER_SITE_OTHER): Payer: Medicare Other | Admitting: Family Medicine

## 2014-09-05 ENCOUNTER — Encounter: Payer: Self-pay | Admitting: Family Medicine

## 2014-09-05 VITALS — BP 135/77 | HR 79 | Temp 98.7°F | Ht 62.0 in | Wt 175.0 lb

## 2014-09-05 DIAGNOSIS — S83412A Sprain of medial collateral ligament of left knee, initial encounter: Secondary | ICD-10-CM

## 2014-09-05 DIAGNOSIS — M159 Polyosteoarthritis, unspecified: Secondary | ICD-10-CM

## 2014-09-05 DIAGNOSIS — M150 Primary generalized (osteo)arthritis: Secondary | ICD-10-CM

## 2014-09-05 MED ORDER — MELOXICAM 15 MG PO TABS: 15.0000 mg | ORAL_TABLET | Freq: Every day | ORAL | Status: DC

## 2014-09-05 NOTE — Progress Notes (Signed)
Pre visit review using our clinic review tool, if applicable. No additional management support is needed unless otherwise documented below in the visit note. 

## 2014-09-05 NOTE — Progress Notes (Signed)
   Subjective:    Patient ID: Tina May, female    DOB: June 16, 1945, 69 y.o.   MRN: [id number]  HPI Here for several things. First she had a sudden pain in the right knee yesterday when she was walking up the steps in her home. She did not slip or fall. She has had some pain ever since. No swelling. Using Advil. Also she asks about something she can take long term for arthritis pain. 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 Advil helps but it does not last very long. She is still active, plays golf, etc.    Review of Systems  Constitutional: Negative.   Musculoskeletal: Positive for arthralgias and gait problem. Negative for myalgias and joint swelling.       Objective:   Physical Exam  Constitutional: She appears well-developed and well-nourished.  Walks with a limp, using a cane  Musculoskeletal:  The left knee has some crepitus but full ROM. No swelling. She is tender along the medial side of the knee but not in the joint space itself.           Assessment & Plan:  MCL sprain. Rest, heat, wear a Neoprene support sleeve. Try Meloxicam for the osteoarthritis.

## 2014-09-08 ENCOUNTER — Emergency Department (HOSPITAL_BASED_OUTPATIENT_CLINIC_OR_DEPARTMENT_OTHER): Payer: Medicare Other

## 2014-09-08 ENCOUNTER — Ambulatory Visit (INDEPENDENT_AMBULATORY_CARE_PROVIDER_SITE_OTHER): Payer: Medicare Other | Admitting: Family Medicine

## 2014-09-08 ENCOUNTER — Emergency Department (HOSPITAL_COMMUNITY)
Admission: EM | Admit: 2014-09-08 | Discharge: 2014-09-08 | Disposition: A | Discharge status: Home / Self Care | Payer: Medicare Other | Attending: Emergency Medicine | Admitting: Emergency Medicine

## 2014-09-08 ENCOUNTER — Emergency Department (HOSPITAL_COMMUNITY): Payer: Medicare Other

## 2014-09-08 ENCOUNTER — Encounter (HOSPITAL_COMMUNITY): Payer: Self-pay

## 2014-09-08 ENCOUNTER — Encounter: Payer: Self-pay | Admitting: Family Medicine

## 2014-09-08 VITALS — BP 144/77 | HR 93 | Temp 98.0°F | Ht 62.0 in | Wt 175.0 lb

## 2014-09-08 DIAGNOSIS — J45909 Unspecified asthma, uncomplicated: Secondary | ICD-10-CM | POA: Insufficient documentation

## 2014-09-08 DIAGNOSIS — I10 Essential (primary) hypertension: Secondary | ICD-10-CM | POA: Diagnosis not present

## 2014-09-08 DIAGNOSIS — M79605 Pain in left leg: Secondary | ICD-10-CM | POA: Insufficient documentation

## 2014-09-08 DIAGNOSIS — Z85828 Personal history of other malignant neoplasm of skin: Secondary | ICD-10-CM | POA: Insufficient documentation

## 2014-09-08 DIAGNOSIS — Z87891 Personal history of nicotine dependence: Secondary | ICD-10-CM | POA: Insufficient documentation

## 2014-09-08 DIAGNOSIS — R079 Chest pain, unspecified: Secondary | ICD-10-CM

## 2014-09-08 DIAGNOSIS — Z7982 Long term (current) use of aspirin: Secondary | ICD-10-CM | POA: Diagnosis not present

## 2014-09-08 DIAGNOSIS — Z8669 Personal history of other diseases of the nervous system and sense organs: Secondary | ICD-10-CM | POA: Insufficient documentation

## 2014-09-08 DIAGNOSIS — M1990 Unspecified osteoarthritis, unspecified site: Secondary | ICD-10-CM | POA: Diagnosis not present

## 2014-09-08 DIAGNOSIS — Z8719 Personal history of other diseases of the digestive system: Secondary | ICD-10-CM | POA: Diagnosis not present

## 2014-09-08 DIAGNOSIS — M25562 Pain in left knee: Secondary | ICD-10-CM

## 2014-09-08 DIAGNOSIS — M79609 Pain in unspecified limb: Secondary | ICD-10-CM

## 2014-09-08 DIAGNOSIS — Z791 Long term (current) use of non-steroidal anti-inflammatories (NSAID): Secondary | ICD-10-CM | POA: Diagnosis not present

## 2014-09-08 DIAGNOSIS — Z79899 Other long term (current) drug therapy: Secondary | ICD-10-CM | POA: Insufficient documentation

## 2014-09-08 DIAGNOSIS — M7989 Other specified soft tissue disorders: Secondary | ICD-10-CM

## 2014-09-08 LAB — I-STAT TROPONIN, ED: Troponin i, poc: 0.01 ng/mL (ref 0.00–0.08)

## 2014-09-08 LAB — CBC WITH DIFFERENTIAL/PLATELET
BASOS ABS: 0 10*3/uL (ref 0.0–0.1)
BASOS PCT: 0 % (ref 0–1)
EOS ABS: 0.1 10*3/uL (ref 0.0–0.7)
Eosinophils Relative: 1 % (ref 0–5)
HEMATOCRIT: 44.5 % (ref 36.0–46.0)
Hemoglobin: 15.1 g/dL — ABNORMAL HIGH (ref 12.0–15.0)
Lymphocytes Relative: 20 % (ref 12–46)
Lymphs Abs: 1.7 10*3/uL (ref 0.7–4.0)
MCH: 32.1 pg (ref 26.0–34.0)
MCHC: 33.9 g/dL (ref 30.0–36.0)
MCV: 94.7 fL (ref 78.0–100.0)
Monocytes Absolute: 0.6 10*3/uL (ref 0.1–1.0)
Monocytes Relative: 7 % (ref 3–12)
NEUTROS PCT: 72 % (ref 43–77)
Neutro Abs: 5.8 10*3/uL (ref 1.7–7.7)
Platelets: 256 10*3/uL (ref 150–400)
RBC: 4.7 MIL/uL (ref 3.87–5.11)
RDW: 12.8 % (ref 11.5–15.5)
WBC: 8.2 10*3/uL (ref 4.0–10.5)

## 2014-09-08 LAB — COMPREHENSIVE METABOLIC PANEL
ALBUMIN: 4.8 g/dL (ref 3.5–5.0)
ALT: 21 U/L (ref 14–54)
ANION GAP: 7 (ref 5–15)
AST: 22 U/L (ref 15–41)
Alkaline Phosphatase: 64 U/L (ref 38–126)
BUN: 20 mg/dL (ref 6–20)
CO2: 29 mmol/L (ref 22–32)
Calcium: 9.3 mg/dL (ref 8.9–10.3)
Chloride: 102 mmol/L (ref 101–111)
Creatinine, Ser: 0.64 mg/dL (ref 0.44–1.00)
GFR calc Af Amer: >60 mL/min (ref >60)
GFR calc non Af Amer: >60 mL/min (ref >60)
GLUCOSE: 98 mg/dL (ref 70–99)
POTASSIUM: 4.3 mmol/L (ref 3.5–5.1)
Sodium: 138 mmol/L (ref 135–145)
Total Bilirubin: 0.5 mg/dL (ref 0.3–1.2)
Total Protein: 7.7 g/dL (ref 6.5–8.1)

## 2014-09-08 LAB — PROTIME-INR
INR: 0.84 (ref 0.00–1.49)
PROTHROMBIN TIME: 11.6 s (ref 11.6–15.2)

## 2014-09-08 LAB — D-DIMER, QUANTITATIVE (NOT AT ARMC): D-Dimer, Quant: 0.41 ug/mL-FEU (ref 0.00–0.48)

## 2014-09-08 MED ORDER — TRAMADOL HCL 50 MG PO TABS: 50.0000 mg | ORAL_TABLET | Freq: Four times a day (QID) | ORAL | Status: DC | PRN

## 2014-09-08 NOTE — Progress Notes (Signed)
   Subjective:    Patient ID: Tina May, female    DOB: 23-Jul-1945, 69 y.o.   MRN: [id number]  HPI Here for the sudden onset last night of pain in the left calf and pain in the left flank. She was seen here 3 days ago for left knee pain which started as she was climbing some stairs in her home the day before. We diagnosed her with a MCL sprain. She wore a support sleeve and it has improved greatly. However last night she developed a deep aching pain in the left calf with some mild foot swelling and she developed a deep seated pain in the left side. No SOB or coughing.   Review of Systems  Constitutional: Negative.   Respiratory: Negative.   Cardiovascular: Positive for chest pain and leg swelling. Negative for palpitations.  Musculoskeletal: Positive for myalgias.       Objective:   Physical Exam  Constitutional: She appears well-developed and well-nourished. No distress.  Cardiovascular: Normal rate, regular rhythm, normal heart sounds and intact distal pulses.   Pulmonary/Chest: Effort normal and breath sounds normal. No respiratory distress. She has no wheezes. She has no rales.  She points to an area over the left lateral lower ribs as the source of pain. This area is not tender at all, no bruising or rashes           Assessment & Plan:  In the setting of left calf pain and left sided chest pain we worry about a possible DVT and PE. She will drive herself now to [facility] ER for further evaluation. I feel she should have a venous doppler and a chest CT angiogram.

## 2014-09-08 NOTE — ED Notes (Signed)
Per pt, 2 days ago had pain behind left knee.  Pt went to MD.  Thought pulled muscle.  Yesterday, pain came up left calf.  Went to MD today and told to come here. Pt also states slight "ache" in left lung.  Denies shortness of breath.

## 2014-09-08 NOTE — ED Notes (Signed)
Ultrasound/vascular at bedside

## 2014-09-08 NOTE — Progress Notes (Signed)
VASCULAR LAB PRELIMINARY  PRELIMINARY  PRELIMINARY  PRELIMINARY  Left lower extremity venous duplex completed.    Preliminary report:  Left:  No evidence of DVT, superficial thrombosis, or Baker's cyst.  Tanijah Morais, RVS 09/08/2014, 7:44 PM

## 2014-09-08 NOTE — Discharge Instructions (Signed)
Follow up with your md if not improving. °

## 2014-09-08 NOTE — Progress Notes (Signed)
Pre visit review using our clinic review tool, if applicable. No additional management support is needed unless otherwise documented below in the visit note. 

## 2014-09-08 NOTE — ED Provider Notes (Signed)
CSN: [id number]     Arrival date & time 09/08/14  1509 History   First MD Initiated Contact with Patient 09/08/14 1646     Chief Complaint  Patient presents with  . Leg Pain     (Consider location/radiation/quality/duration/timing/severity/associated sxs/prior Treatment) Patient is a 69 y.o. female presenting with leg pain. The history is provided by the patient (pt complains of pain in left lower leg).  Leg Pain Lower extremity pain location: left calf. Pain details:    Quality:  Aching   Radiates to:  Does not radiate   Severity:  Moderate   Onset quality:  Sudden   Timing:  Constant Associated symptoms: no back pain and no fatigue     Past Medical History  Diagnosis Date  . Allergy   . Hyperlipidemia   . Hypertension   . Arthritis   . Asthma   . Diverticulosis   . Hepatic cyst   . Pancreatic lesion 03/11/13  . External hemorrhoids   . Cancer     skin basel cell  . Cataract     starting   Past Surgical History  Procedure Laterality Date  . Abdominal hysterectomy    . Small intestine surgery      from small intestine  . Colonoscopy  08-06-07    per Dr. [name], diverticulae only, repeat in 10 yrs    Family History  Problem Relation Age of Onset  . Hyperlipidemia    . Hypertension    . Prostate cancer    . Colon cancer Neg Hx    History  Substance Use Topics  . Smoking status: Former Research scientist (life sciences)  . Smokeless tobacco: Never Used  . Alcohol Use: 0.0 oz/week    0 Standard drinks or equivalent per week     Comment: occ   OB History    Gravida Para Term Preterm AB TAB SAB Ectopic Multiple Living   3 3        3      Review of Systems  Constitutional: Negative for appetite change and fatigue.  HENT: Negative for congestion, ear discharge and sinus pressure.   Eyes: Negative for discharge.  Respiratory: Negative for cough.   Cardiovascular: Negative for chest pain.  Gastrointestinal: Negative for abdominal pain and diarrhea.  Genitourinary: Negative for frequency  and hematuria.  Musculoskeletal: Negative for back pain.       Pain in left lowe leg  Skin: Negative for rash.  Neurological: Negative for seizures and headaches.  Psychiatric/Behavioral: Negative for hallucinations.      Allergies  Atorvastatin and Morphine and related  Home Medications   Prior to Admission medications   Medication Sig Start Date End Date Taking? Authorizing Provider  aspirin 81 MG tablet Take 81 mg by mouth daily.     Yes Historical Provider, MD  CRESTOR 5 MG tablet TAKE 1 TABLET (5 MG TOTAL) BY MOUTH DAILY. 06/22/14  Yes [name], MD  diltiazem (CARDIZEM CD) 240 MG 24 hr capsule TAKE 1 CAPSULE (240 MG TOTAL) BY MOUTH DAILY. 08/02/14  Yes [name], MD  ibuprofen (ADVIL,MOTRIN) 200 MG tablet Take 400 mg by mouth every 6 (six) hours as needed for headache, mild pain or moderate pain.   Yes Historical Provider, MD  Ibuprofen-Diphenhydramine Cit (ADVIL PM PO) Take 2 tablets by mouth at bedtime.   Yes Historical Provider, MD  Melatonin 10 MG TABS Take 1 tablet by mouth at bedtime as needed (sleep).    Yes Historical Provider, MD  meloxicam [facility])  15 MG tablet Take 1 tablet (15 mg total) by mouth daily. 09/05/14  Yes [name], MD  Multiple Vitamin (MULTIVITAMIN) tablet Take 1 tablet by mouth daily.     Yes Historical Provider, MD  PREMARIN 0.625 MG tablet Take 0.625 mg by mouth once a week.  11/06/13  Yes Historical Provider, MD  RESTASIS 0.05 % ophthalmic emulsion Place 1 drop into both eyes 2 (two) times daily.  09/28/13  Yes Historical Provider, MD  valsartan (DIOVAN) 160 MG tablet Take 1 tablet (160 mg total) by mouth daily. 11/30/13  Yes [name], MD  VENTOLIN HFA 108 (90 BASE) MCG/ACT inhaler INHALE 2 PUFFS INTO THE LUNGS EVERY 4 (FOUR) HOURS AS NEEDED FOR WHEEZING OR SHORTNESS OF BREATH.   Yes [name], MD  vitamin E 200 UNIT capsule Take 200 Units by mouth daily.     Yes Historical Provider, MD  cyclobenzaprine (FLEXERIL) 10 MG tablet Take 1 tablet  (10 mg total) by mouth 3 (three) times daily as needed for muscle spasms. Patient not taking: Reported on 09/08/2014 11/30/13   [name], MD  traMADol (ULTRAM) 50 MG tablet Take 1 tablet (50 mg total) by mouth every 6 (six) hours as needed. 09/08/14   Milton Ferguson, MD   BP 141/71 mmHg  Pulse 71  Temp(Src) 98 F (36.7 C) (Oral)  Resp 18  SpO2 95% Physical Exam  Constitutional: She is oriented to person, place, and time. She appears well-developed.  HENT:  Head: Normocephalic.  Eyes: Conjunctivae and EOM are normal. No scleral icterus.  Neck: Neck supple. No thyromegaly present.  Cardiovascular: Normal rate and regular rhythm.  Exam reveals no gallop and no friction rub.   No murmur heard. Pulmonary/Chest: No stridor. She has no wheezes. She has no rales. She exhibits no tenderness.  Abdominal: She exhibits no distension. There is no tenderness. There is no rebound.  Musculoskeletal: Normal range of motion. She exhibits tenderness. She exhibits no edema.  Tender left calf  Lymphadenopathy:    She has no cervical adenopathy.  Neurological: She is oriented to person, place, and time. She exhibits normal muscle tone. Coordination normal.  Skin: No rash noted. No erythema.  Psychiatric: She has a normal mood and affect. Her behavior is normal.    ED Course  Procedures (including critical care time) Labs Review Labs Reviewed  CBC WITH DIFFERENTIAL/PLATELET - Abnormal; Notable for the following:    Hemoglobin 15.1 (*)    All other components within normal limits  D-DIMER, QUANTITATIVE  PROTIME-INR  COMPREHENSIVE METABOLIC PANEL  I-STAT TROPOININ, ED    Imaging Review Dg Chest 2 View  09/08/2014   CLINICAL DATA:  Leg pain  EXAM: CHEST  2 VIEW  COMPARISON:  05/15/2005  FINDINGS: Cardiomediastinal silhouette is stable. No acute infiltrate or pleural effusion. No pulmonary edema. Degenerative changes thoracic spine again noted.  IMPRESSION: No active cardiopulmonary disease.    Electronically Signed   By: Lahoma Crocker M.D.   On: 09/08/2014 16:53     EKG Interpretation   Date/Time:  Friday Sep 08 2014 15:33:58 EDT Ventricular Rate:  90 PR Interval:  188 QRS Duration: 87 QT Interval:  420 QTC Calculation: 514 R Axis:   20 Text Interpretation:  Sinus rhythm Probable anterior infarct, old Minimal  ST depression, lateral leads Prolonged QT interval Baseline wander in  lead(s) V3 V5 V6 Confirmed by Cross Jorge  MD, Zaiyah Sottile ([id number]) on 09/08/2014  4:55:22 PM      MDM  Final diagnoses:  Leg pain, inferior, left       Milton Ferguson, MD 09/08/14 2006

## 2014-09-30 ENCOUNTER — Other Ambulatory Visit: Payer: Self-pay | Admitting: Family Medicine

## 2014-11-28 ENCOUNTER — Other Ambulatory Visit: Payer: Self-pay | Admitting: Family Medicine

## 2014-12-06 ENCOUNTER — Encounter: Payer: Self-pay | Admitting: *Deleted

## 2014-12-31 ENCOUNTER — Other Ambulatory Visit: Payer: Self-pay | Admitting: Family Medicine

## 2015-03-24 ENCOUNTER — Other Ambulatory Visit: Payer: Self-pay | Admitting: Family Medicine

## 2015-04-10 ENCOUNTER — Other Ambulatory Visit: Payer: Self-pay | Admitting: Family Medicine

## 2015-05-04 ENCOUNTER — Encounter: Payer: Self-pay | Admitting: Family Medicine

## 2015-05-04 ENCOUNTER — Ambulatory Visit (INDEPENDENT_AMBULATORY_CARE_PROVIDER_SITE_OTHER): Payer: Medicare Other | Admitting: Family Medicine

## 2015-05-04 VITALS — BP 142/80 | HR 83 | Temp 98.0°F | Ht 62.0 in | Wt 180.0 lb

## 2015-05-04 DIAGNOSIS — R5382 Chronic fatigue, unspecified: Secondary | ICD-10-CM

## 2015-05-04 DIAGNOSIS — R0789 Other chest pain: Secondary | ICD-10-CM

## 2015-05-04 DIAGNOSIS — Z23 Encounter for immunization: Secondary | ICD-10-CM

## 2015-05-04 LAB — BASIC METABOLIC PANEL
BUN: 23 mg/dL (ref 6–23)
CHLORIDE: 102 meq/L (ref 96–112)
CO2: 34 meq/L — AB (ref 19–32)
Calcium: 9.9 mg/dL (ref 8.4–10.5)
Creatinine, Ser: 0.99 mg/dL (ref 0.40–1.20)
GFR: 58.99 mL/min — ABNORMAL LOW (ref >60.00)
GLUCOSE: 113 mg/dL — AB (ref 70–99)
POTASSIUM: 4.1 meq/L (ref 3.5–5.1)
Sodium: 142 mEq/L (ref 135–145)

## 2015-05-04 LAB — CBC WITH DIFFERENTIAL/PLATELET
Basophils Absolute: 0 10*3/uL (ref 0.0–0.1)
Basophils Relative: 0.4 % (ref 0.0–3.0)
Eosinophils Absolute: 0.2 10*3/uL (ref 0.0–0.7)
Eosinophils Relative: 1.7 % (ref 0.0–5.0)
HEMATOCRIT: 46 % (ref 36.0–46.0)
Hemoglobin: 15.2 g/dL — ABNORMAL HIGH (ref 12.0–15.0)
LYMPHS PCT: 20.7 % (ref 12.0–46.0)
Lymphs Abs: 2.2 10*3/uL (ref 0.7–4.0)
MCHC: 33.1 g/dL (ref 30.0–36.0)
MCV: 95 fl (ref 78.0–100.0)
MONOS PCT: 8.2 % (ref 3.0–12.0)
Monocytes Absolute: 0.9 10*3/uL (ref 0.1–1.0)
Neutro Abs: 7.5 10*3/uL (ref 1.4–7.7)
Neutrophils Relative %: 69 % (ref 43.0–77.0)
Platelets: 266 10*3/uL (ref 150.0–400.0)
RBC: 4.85 Mil/uL (ref 3.87–5.11)
RDW: 13.6 % (ref 11.5–15.5)
WBC: 10.8 10*3/uL — AB (ref 4.0–10.5)

## 2015-05-04 LAB — HEPATIC FUNCTION PANEL
ALBUMIN: 4.5 g/dL (ref 3.5–5.2)
ALT: 24 U/L (ref 0–35)
AST: 20 U/L (ref 0–37)
Alkaline Phosphatase: 62 U/L (ref 39–117)
BILIRUBIN DIRECT: 0.1 mg/dL (ref 0.0–0.3)
TOTAL PROTEIN: 7.5 g/dL (ref 6.0–8.3)
Total Bilirubin: 0.3 mg/dL (ref 0.2–1.2)

## 2015-05-04 LAB — TSH: TSH: 1.1 u[IU]/mL (ref 0.35–4.50)

## 2015-05-04 NOTE — Progress Notes (Signed)
Pre visit review using our clinic review tool, if applicable. No additional management support is needed unless otherwise documented below in the visit note. 

## 2015-05-04 NOTE — Progress Notes (Signed)
   Subjective:    Patient ID: Tina May, female    DOB: March 20, 1946, 69 y.o.   MRN: [id number]  HPI Here for 2 things. First she has had a sharp intermittent pain in the left side for the past week. No recent trauma. Moving can make it better or worse. No SOB or cough. No bowel or urinary issues. Also she has felt fatigued for a few months. No other specific symptoms.    Review of Systems  Constitutional: Positive for fatigue. Negative for fever.  HENT: Negative.   Respiratory: Negative.   Cardiovascular: Positive for chest pain. Negative for palpitations and leg swelling.  Gastrointestinal: Negative.   Genitourinary: Negative.   Neurological: Negative.        Objective:   Physical Exam  Constitutional: She is oriented to person, place, and time. She appears well-developed and well-nourished.  Neck: No thyromegaly present.  Cardiovascular: Normal rate, regular rhythm, normal heart sounds and intact distal pulses.   Pulmonary/Chest: Effort normal and breath sounds normal. No respiratory distress. She has no wheezes. She has no rales.  Tender along the left lateral ribs  Abdominal: Soft. Bowel sounds are normal. She exhibits no distension and no mass. There is no tenderness. There is no rebound and no guarding.  Lymphadenopathy:    She has no cervical adenopathy.  Neurological: She is alert and oriented to person, place, and time.          Assessment & Plan:  Thoracic muscle strain. Rest and take Advil prn. The etiology of the fatigue is not clear. We will get labs today

## 2015-05-15 DIAGNOSIS — R05 Cough: Secondary | ICD-10-CM | POA: Diagnosis not present

## 2015-05-15 DIAGNOSIS — R0602 Shortness of breath: Secondary | ICD-10-CM | POA: Diagnosis not present

## 2015-05-15 DIAGNOSIS — J019 Acute sinusitis, unspecified: Secondary | ICD-10-CM | POA: Diagnosis not present

## 2015-06-04 ENCOUNTER — Other Ambulatory Visit: Payer: Self-pay | Admitting: Family Medicine

## 2015-06-04 MED ORDER — ALBUTEROL SULFATE HFA 108 (90 BASE) MCG/ACT IN AERS: INHALATION_SPRAY | RESPIRATORY_TRACT | Status: DC

## 2015-06-28 ENCOUNTER — Other Ambulatory Visit: Payer: Self-pay | Admitting: Family Medicine

## 2015-07-05 ENCOUNTER — Other Ambulatory Visit: Payer: Self-pay | Admitting: Family Medicine

## 2015-07-09 ENCOUNTER — Other Ambulatory Visit: Payer: Self-pay | Admitting: Family Medicine

## 2015-07-11 ENCOUNTER — Telehealth: Payer: Self-pay | Admitting: Family Medicine

## 2015-07-11 MED ORDER — ROSUVASTATIN CALCIUM 5 MG PO TABS: ORAL_TABLET | ORAL | Status: DC

## 2015-07-11 NOTE — Telephone Encounter (Signed)
Pt states she is in [place] and will not be back until after April 5. Pt is out of CRESTOR 5 MG tablet    and it was refused . Pt would like a refill until she gets home, and then will come in. Does pt need an appointment or just labs when she returns?  [facility] [place] [facility] [place]

## 2015-07-11 NOTE — Telephone Encounter (Signed)
I sent script e-scribe for a 30 day supply. Can you call pt to schedule a CPE and advise pt to fast for this, try to get her a morning appointment?

## 2015-07-11 NOTE — Telephone Encounter (Signed)
Pt has scheduled.

## 2015-07-16 ENCOUNTER — Other Ambulatory Visit: Payer: Self-pay | Admitting: *Deleted

## 2015-07-16 MED ORDER — VALSARTAN 160 MG PO TABS: ORAL_TABLET | ORAL | Status: DC

## 2015-08-06 ENCOUNTER — Other Ambulatory Visit: Payer: Self-pay | Admitting: Family Medicine

## 2015-08-10 ENCOUNTER — Ambulatory Visit (INDEPENDENT_AMBULATORY_CARE_PROVIDER_SITE_OTHER): Payer: PPO | Admitting: Family Medicine

## 2015-08-10 ENCOUNTER — Encounter: Payer: Self-pay | Admitting: Family Medicine

## 2015-08-10 VITALS — BP 138/89 | HR 97 | Temp 98.4°F | Ht 62.0 in | Wt 180.0 lb

## 2015-08-10 DIAGNOSIS — Z0000 Encounter for general adult medical examination without abnormal findings: Secondary | ICD-10-CM

## 2015-08-10 LAB — POC URINALSYSI DIPSTICK (AUTOMATED)
Bilirubin, UA: NEGATIVE
Glucose, UA: NEGATIVE
Ketones, UA: NEGATIVE
LEUKOCYTES UA: NEGATIVE
Nitrite, UA: NEGATIVE
PROTEIN UA: NEGATIVE
Spec Grav, UA: 1.02
Urobilinogen, UA: 0.2
pH, UA: 6

## 2015-08-10 LAB — CBC WITH DIFFERENTIAL/PLATELET
Basophils Absolute: 0 10*3/uL (ref 0.0–0.1)
Basophils Relative: 0.6 % (ref 0.0–3.0)
Eosinophils Absolute: 0.3 10*3/uL (ref 0.0–0.7)
Eosinophils Relative: 4 % (ref 0.0–5.0)
HEMATOCRIT: 43.3 % (ref 36.0–46.0)
HEMOGLOBIN: 14.7 g/dL (ref 12.0–15.0)
Lymphocytes Relative: 24 % (ref 12.0–46.0)
Lymphs Abs: 1.5 10*3/uL (ref 0.7–4.0)
MCHC: 33.9 g/dL (ref 30.0–36.0)
MCV: 93.5 fl (ref 78.0–100.0)
Monocytes Absolute: 0.5 10*3/uL (ref 0.1–1.0)
Monocytes Relative: 7.6 % (ref 3.0–12.0)
Neutro Abs: 4.1 10*3/uL (ref 1.4–7.7)
Neutrophils Relative %: 63.8 % (ref 43.0–77.0)
Platelets: 241 10*3/uL (ref 150.0–400.0)
RBC: 4.63 Mil/uL (ref 3.87–5.11)
RDW: 13.4 % (ref 11.5–15.5)
WBC: 6.4 10*3/uL (ref 4.0–10.5)

## 2015-08-10 LAB — HEPATIC FUNCTION PANEL
ALK PHOS: 59 U/L (ref 39–117)
ALT: 22 U/L (ref 0–35)
AST: 21 U/L (ref 0–37)
Albumin: 4.3 g/dL (ref 3.5–5.2)
BILIRUBIN DIRECT: 0.1 mg/dL (ref 0.0–0.3)
BILIRUBIN TOTAL: 0.4 mg/dL (ref 0.2–1.2)
Total Protein: 6.8 g/dL (ref 6.0–8.3)

## 2015-08-10 LAB — BASIC METABOLIC PANEL
BUN: 18 mg/dL (ref 6–23)
CHLORIDE: 102 meq/L (ref 96–112)
CO2: 31 mEq/L (ref 19–32)
CREATININE: 0.7 mg/dL (ref 0.40–1.20)
Calcium: 9.3 mg/dL (ref 8.4–10.5)
GFR: 87.93 mL/min (ref >60.00)
Glucose, Bld: 106 mg/dL — ABNORMAL HIGH (ref 70–99)
Potassium: 4.5 mEq/L (ref 3.5–5.1)
SODIUM: 141 meq/L (ref 135–145)

## 2015-08-10 LAB — LIPID PANEL
CHOL/HDL RATIO: 3
Cholesterol: 207 mg/dL — ABNORMAL HIGH (ref 0–200)
HDL: 62.1 mg/dL (ref >39.00)
LDL Cholesterol: 115 mg/dL — ABNORMAL HIGH (ref 0–99)
NONHDL: 145.25
Triglycerides: 153 mg/dL — ABNORMAL HIGH (ref 0.0–149.0)
VLDL: 30.6 mg/dL (ref 0.0–40.0)

## 2015-08-10 LAB — TSH: TSH: 0.9 u[IU]/mL (ref 0.35–4.50)

## 2015-08-10 MED ORDER — ALBUTEROL SULFATE HFA 108 (90 BASE) MCG/ACT IN AERS: INHALATION_SPRAY | RESPIRATORY_TRACT | Status: DC

## 2015-08-10 MED ORDER — DILTIAZEM HCL ER COATED BEADS 240 MG PO CP24: ORAL_CAPSULE | ORAL | Status: DC

## 2015-08-10 MED ORDER — VALSARTAN 160 MG PO TABS: ORAL_TABLET | ORAL | Status: DC

## 2015-08-10 MED ORDER — ROSUVASTATIN CALCIUM 5 MG PO TABS: 5.0000 mg | ORAL_TABLET | Freq: Every day | ORAL | Status: DC

## 2015-08-10 MED ORDER — MELOXICAM 15 MG PO TABS: 15.0000 mg | ORAL_TABLET | Freq: Every day | ORAL | Status: DC

## 2015-08-10 NOTE — Progress Notes (Signed)
Pre visit review using our clinic review tool, if applicable. No additional management support is needed unless otherwise documented below in the visit note. 

## 2015-08-10 NOTE — Progress Notes (Signed)
   Subjective:    Patient ID: Tina May, female    DOB: March 31, 1946, 70 y.o.   MRN: [id number]  HPI 70 yr old female for a cpx. She feels well except for the arthritis in her hands. She stays active and plays golf several days a week.    Review of Systems  Constitutional: Negative.   HENT: Negative.   Eyes: Negative.   Respiratory: Negative.   Cardiovascular: Negative.   Gastrointestinal: Negative.   Genitourinary: Negative for dysuria, urgency, frequency, hematuria, flank pain, decreased urine volume, enuresis, difficulty urinating, pelvic pain and dyspareunia.  Musculoskeletal: Negative.   Skin: Negative.   Neurological: Negative.   Psychiatric/Behavioral: Negative.        Objective:   Physical Exam  Constitutional: She is oriented to person, place, and time. She appears well-developed and well-nourished. No distress.  HENT:  Head: Normocephalic and atraumatic.  Right Ear: External ear normal.  Left Ear: External ear normal.  Nose: Nose normal.  Mouth/Throat: Oropharynx is clear and moist. No oropharyngeal exudate.  Eyes: Conjunctivae and EOM are normal. Pupils are equal, round, and reactive to light. No scleral icterus.  Neck: Normal range of motion. Neck supple. No JVD present. No thyromegaly present.  Cardiovascular: Normal rate, regular rhythm, normal heart sounds and intact distal pulses.  Exam reveals no gallop and no friction rub.   No murmur heard. Pulmonary/Chest: Effort normal and breath sounds normal. No respiratory distress. She has no wheezes. She has no rales. She exhibits no tenderness.  Abdominal: Soft. Bowel sounds are normal. She exhibits no distension and no mass. There is no tenderness. There is no rebound and no guarding.  Musculoskeletal: Normal range of motion. She exhibits no edema or tenderness.  Lymphadenopathy:    She has no cervical adenopathy.  Neurological: She is alert and oriented to person, place, and time. She has normal reflexes. No  cranial nerve deficit. She exhibits normal muscle tone. Coordination normal.  Skin: Skin is warm and dry. No rash noted. No erythema.  Psychiatric: She has a normal mood and affect. Her behavior is normal. Judgment and thought content normal.          Assessment & Plan:  Well exam. We discussed diet and exercise. Get fasting labs. [name], MD

## 2015-09-01 ENCOUNTER — Other Ambulatory Visit: Payer: Self-pay | Admitting: Family Medicine

## 2016-03-26 DIAGNOSIS — H524 Presbyopia: Secondary | ICD-10-CM | POA: Diagnosis not present

## 2016-05-07 DIAGNOSIS — Z1231 Encounter for screening mammogram for malignant neoplasm of breast: Secondary | ICD-10-CM | POA: Diagnosis not present

## 2016-05-07 DIAGNOSIS — Z01411 Encounter for gynecological examination (general) (routine) with abnormal findings: Secondary | ICD-10-CM | POA: Diagnosis not present

## 2016-05-07 DIAGNOSIS — N951 Menopausal and female climacteric states: Secondary | ICD-10-CM | POA: Diagnosis not present

## 2016-05-08 DIAGNOSIS — R202 Paresthesia of skin: Secondary | ICD-10-CM | POA: Diagnosis not present

## 2016-05-08 DIAGNOSIS — L570 Actinic keratosis: Secondary | ICD-10-CM | POA: Diagnosis not present

## 2016-05-08 DIAGNOSIS — L814 Other melanin hyperpigmentation: Secondary | ICD-10-CM | POA: Diagnosis not present

## 2016-05-08 DIAGNOSIS — Z7982 Long term (current) use of aspirin: Secondary | ICD-10-CM | POA: Diagnosis not present

## 2016-05-08 DIAGNOSIS — Z85828 Personal history of other malignant neoplasm of skin: Secondary | ICD-10-CM | POA: Diagnosis not present

## 2016-05-08 DIAGNOSIS — L821 Other seborrheic keratosis: Secondary | ICD-10-CM | POA: Diagnosis not present

## 2016-05-08 DIAGNOSIS — Z79899 Other long term (current) drug therapy: Secondary | ICD-10-CM | POA: Diagnosis not present

## 2016-05-08 DIAGNOSIS — L299 Pruritus, unspecified: Secondary | ICD-10-CM | POA: Diagnosis not present

## 2016-05-08 DIAGNOSIS — I781 Nevus, non-neoplastic: Secondary | ICD-10-CM | POA: Diagnosis not present

## 2016-05-08 DIAGNOSIS — L309 Dermatitis, unspecified: Secondary | ICD-10-CM | POA: Diagnosis not present

## 2016-05-09 ENCOUNTER — Ambulatory Visit (INDEPENDENT_AMBULATORY_CARE_PROVIDER_SITE_OTHER): Payer: PPO | Admitting: Family Medicine

## 2016-05-09 ENCOUNTER — Ambulatory Visit (INDEPENDENT_AMBULATORY_CARE_PROVIDER_SITE_OTHER)
Admission: RE | Admit: 2016-05-09 | Discharge: 2016-05-09 | Disposition: A | Discharge status: Home / Self Care | Payer: PPO | Source: Ambulatory Visit | Attending: Family Medicine | Admitting: Family Medicine

## 2016-05-09 ENCOUNTER — Encounter: Payer: Self-pay | Admitting: Family Medicine

## 2016-05-09 VITALS — BP 138/87 | HR 96 | Temp 98.0°F | Wt 184.0 lb

## 2016-05-09 DIAGNOSIS — R0602 Shortness of breath: Secondary | ICD-10-CM

## 2016-05-09 DIAGNOSIS — R739 Hyperglycemia, unspecified: Secondary | ICD-10-CM

## 2016-05-09 LAB — BASIC METABOLIC PANEL
BUN: 24 mg/dL — ABNORMAL HIGH (ref 6–23)
CHLORIDE: 105 meq/L (ref 96–112)
CO2: 31 meq/L (ref 19–32)
Calcium: 9.5 mg/dL (ref 8.4–10.5)
Creatinine, Ser: 0.77 mg/dL (ref 0.40–1.20)
GFR: 78.6 mL/min (ref >60.00)
Glucose, Bld: 104 mg/dL — ABNORMAL HIGH (ref 70–99)
POTASSIUM: 3.7 meq/L (ref 3.5–5.1)
Sodium: 145 mEq/L (ref 135–145)

## 2016-05-09 LAB — CBC WITH DIFFERENTIAL/PLATELET
BASOS PCT: 0.9 % (ref 0.0–3.0)
Basophils Absolute: 0.1 10*3/uL (ref 0.0–0.1)
Eosinophils Absolute: 0.3 10*3/uL (ref 0.0–0.7)
Eosinophils Relative: 3.4 % (ref 0.0–5.0)
HCT: 42.9 % (ref 36.0–46.0)
Hemoglobin: 14.4 g/dL (ref 12.0–15.0)
LYMPHS ABS: 1.7 10*3/uL (ref 0.7–4.0)
Lymphocytes Relative: 20.9 % (ref 12.0–46.0)
MCHC: 33.6 g/dL (ref 30.0–36.0)
MCV: 94.3 fl (ref 78.0–100.0)
MONO ABS: 0.7 10*3/uL (ref 0.1–1.0)
Monocytes Relative: 8.2 % (ref 3.0–12.0)
NEUTROS PCT: 66.6 % (ref 43.0–77.0)
Neutro Abs: 5.4 10*3/uL (ref 1.4–7.7)
Platelets: 257 10*3/uL (ref 150.0–400.0)
RBC: 4.55 Mil/uL (ref 3.87–5.11)
RDW: 13.6 % (ref 11.5–15.5)
WBC: 8.1 10*3/uL (ref 4.0–10.5)

## 2016-05-09 LAB — HEPATIC FUNCTION PANEL
ALT: 24 U/L (ref 0–35)
AST: 20 U/L (ref 0–37)
Albumin: 4.7 g/dL (ref 3.5–5.2)
Alkaline Phosphatase: 61 U/L (ref 39–117)
Bilirubin, Direct: 0.1 mg/dL (ref 0.0–0.3)
Total Bilirubin: 0.4 mg/dL (ref 0.2–1.2)
Total Protein: 7.1 g/dL (ref 6.0–8.3)

## 2016-05-09 LAB — HEMOGLOBIN A1C: HEMOGLOBIN A1C: 5.8 % (ref 4.6–6.5)

## 2016-05-09 LAB — TSH: TSH: 0.8 u[IU]/mL (ref 0.35–4.50)

## 2016-05-09 NOTE — Progress Notes (Signed)
   Subjective:    Patient ID: Tina May, female    DOB: [birth year], 71 y.o.   MRN: [id number]  HPI Here for one month of mild SOB on exertion. No coughing or wheezing, so she does not think this is from her asthma. No fevers. No chest pain or palpitations. She feels fine otherwise. She feels fine at rest but going up steps is causing some SOB. Her husband died a month ago of lung cancer (he was a life long smoker). She plans to do a lot of travelling this coming year, including to [place] for 3 months (leaving in 2 days), to [place], to [place], and to [place].    Review of Systems  Constitutional: Negative.   Respiratory: Positive for shortness of breath. Negative for cough, choking, chest tightness, wheezing and stridor.   Cardiovascular: Negative.   Gastrointestinal: Negative.   Endocrine: Negative.   Genitourinary: Negative.   Neurological: Negative.        Objective:   Physical Exam  Constitutional: She is oriented to person, place, and time. She appears well-developed and well-nourished. No distress.  Neck: No thyromegaly present.  Cardiovascular: Normal rate, regular rhythm, normal heart sounds and intact distal pulses.   No murmur heard. Pulmonary/Chest: Effort normal and breath sounds normal. No respiratory distress. She has no wheezes. She has no rales.  Abdominal: Soft. Bowel sounds are normal. She exhibits no distension and no mass. There is no tenderness. There is no rebound and no guarding.  Musculoskeletal: She exhibits no edema.  Lymphadenopathy:    She has no cervical adenopathy.  Neurological: She is alert and oriented to person, place, and time.          Assessment & Plan:  SOB on exertion. We will send her for labs to look for metabolic causes and she will get a CXR today. After she returns from [place] if she is still symptomatic, we may need to consider cardiac stress testing, etc.  [name], MD

## 2016-05-19 DIAGNOSIS — L2489 Irritant contact dermatitis due to other agents: Secondary | ICD-10-CM | POA: Diagnosis not present

## 2016-05-19 DIAGNOSIS — Z85828 Personal history of other malignant neoplasm of skin: Secondary | ICD-10-CM | POA: Diagnosis not present

## 2016-05-19 DIAGNOSIS — L578 Other skin changes due to chronic exposure to nonionizing radiation: Secondary | ICD-10-CM | POA: Diagnosis not present

## 2016-08-11 ENCOUNTER — Telehealth: Payer: Self-pay | Admitting: Family Medicine

## 2016-08-22 ENCOUNTER — Encounter: Payer: Self-pay | Admitting: Family Medicine

## 2016-08-22 ENCOUNTER — Ambulatory Visit (INDEPENDENT_AMBULATORY_CARE_PROVIDER_SITE_OTHER): Payer: PPO | Admitting: Family Medicine

## 2016-08-22 VITALS — BP 140/96 | HR 82 | Temp 97.5°F | Ht 62.0 in | Wt 185.0 lb

## 2016-08-22 DIAGNOSIS — Z0000 Encounter for general adult medical examination without abnormal findings: Secondary | ICD-10-CM

## 2016-08-22 LAB — POC URINALSYSI DIPSTICK (AUTOMATED)
BILIRUBIN UA: NEGATIVE
GLUCOSE UA: NEGATIVE
Ketones, UA: NEGATIVE
Leukocytes, UA: NEGATIVE
Nitrite, UA: NEGATIVE
PH UA: 6 (ref 5.0–8.0)
PROTEIN UA: NEGATIVE
RBC UA: NEGATIVE
SPEC GRAV UA: 1.02 (ref 1.010–1.025)
Urobilinogen, UA: 0.2 E.U./dL

## 2016-08-22 LAB — LIPID PANEL
CHOL/HDL RATIO: 3
Cholesterol: 210 mg/dL — ABNORMAL HIGH (ref 0–200)
HDL: 67.9 mg/dL (ref >39.00)
LDL CALC: 112 mg/dL — AB (ref 0–99)
NonHDL: 141.85
TRIGLYCERIDES: 149 mg/dL (ref 0.0–149.0)
VLDL: 29.8 mg/dL (ref 0.0–40.0)

## 2016-08-22 MED ORDER — VALSARTAN 160 MG PO TABS
ORAL_TABLET | ORAL | 3 refills | Status: DC
Start: 2016-08-22 — End: 2016-10-29

## 2016-08-22 MED ORDER — ALBUTEROL SULFATE HFA 108 (90 BASE) MCG/ACT IN AERS: 2.0000 | INHALATION_SPRAY | RESPIRATORY_TRACT | 3 refills | Status: DC | PRN

## 2016-08-22 MED ORDER — DILTIAZEM HCL ER COATED BEADS 240 MG PO CP24: ORAL_CAPSULE | ORAL | 3 refills | Status: DC

## 2016-08-22 MED ORDER — ROSUVASTATIN CALCIUM 5 MG PO TABS: 5.0000 mg | ORAL_TABLET | Freq: Every day | ORAL | 3 refills | Status: DC

## 2016-08-22 MED ORDER — MELOXICAM 15 MG PO TABS: 15.0000 mg | ORAL_TABLET | Freq: Every day | ORAL | 3 refills | Status: DC

## 2016-08-22 NOTE — Progress Notes (Signed)
Pre visit review using our clinic review tool, if applicable. No additional management support is needed unless otherwise documented below in the visit note. 

## 2016-08-22 NOTE — Patient Instructions (Signed)
WE NOW OFFER   [facility] FAST TRACK!!!  SAME DAY Appointments for ACUTE CARE  Such as: Sprains, Injuries, cuts, abrasions, rashes, muscle pain, joint pain, back pain Colds, flu, sore throats, headache, allergies, cough, fever  Ear pain, sinus and eye infections Abdominal pain, nausea, vomiting, diarrhea, upset stomach Animal/insect bites  3 Easy Ways to Schedule: Walk-In Scheduling Call in scheduling Mychart Sign-up: [email or web address]         

## 2016-08-22 NOTE — Progress Notes (Signed)
   Subjective:    Patient ID: Tina May, female    DOB: [birth year], 71 y.o.   MRN: [id number]  HPI 71 yr old female for a well exam. She feels fine although the spring time allergies are bothering her asthma a bit. We spoke about some SOB on exertion she was experiencing in January, and she had some normal labs and a normal CXR. Since then she has felt back to normal, and she thinks over the winter she was inactive and got out of shape. She just returned from travelling to [place] and [place], and this went well.    Review of Systems  Constitutional: Negative.   HENT: Negative.   Eyes: Negative.   Respiratory: Negative.   Cardiovascular: Negative.   Gastrointestinal: Negative.   Genitourinary: Negative for decreased urine volume, difficulty urinating, dyspareunia, dysuria, enuresis, flank pain, frequency, hematuria, pelvic pain and urgency.  Musculoskeletal: Negative.   Skin: Negative.   Neurological: Negative.   Psychiatric/Behavioral: Negative.        Objective:   Physical Exam  Constitutional: She is oriented to person, place, and time. She appears well-developed and well-nourished. No distress.  HENT:  Head: Normocephalic and atraumatic.  Right Ear: External ear normal.  Left Ear: External ear normal.  Nose: Nose normal.  Mouth/Throat: Oropharynx is clear and moist. No oropharyngeal exudate.  Eyes: Conjunctivae and EOM are normal. Pupils are equal, round, and reactive to light. No scleral icterus.  Neck: Normal range of motion. Neck supple. No JVD present. No thyromegaly present.  Cardiovascular: Normal rate, regular rhythm, normal heart sounds and intact distal pulses.  Exam reveals no gallop and no friction rub.   No murmur heard. Pulmonary/Chest: Effort normal and breath sounds normal. No respiratory distress. She has no wheezes. She has no rales. She exhibits no tenderness.  Abdominal: Soft. Bowel sounds are normal. She exhibits no distension and no mass. There is no  tenderness. There is no rebound and no guarding.  Musculoskeletal: Normal range of motion. She exhibits no edema or tenderness.  Lymphadenopathy:    She has no cervical adenopathy.  Neurological: She is alert and oriented to person, place, and time. She has normal reflexes. No cranial nerve deficit. She exhibits normal muscle tone. Coordination normal.  Skin: Skin is warm and dry. No rash noted. No erythema.  Psychiatric: She has a normal mood and affect. Her behavior is normal. Judgment and thought content normal.          Assessment & Plan:  Well exam. We discussed diet and exercise.  [name], MD

## 2016-09-16 NOTE — Telephone Encounter (Signed)
Called to see if pt wanted to schedule awv -no answer / no voicemail.  

## 2016-09-19 ENCOUNTER — Encounter: Payer: Self-pay | Admitting: Family Medicine

## 2016-09-19 ENCOUNTER — Ambulatory Visit (INDEPENDENT_AMBULATORY_CARE_PROVIDER_SITE_OTHER): Payer: PPO | Admitting: Family Medicine

## 2016-09-19 VITALS — BP 138/84 | HR 99 | Temp 98.6°F | Wt 186.0 lb

## 2016-09-19 DIAGNOSIS — J4521 Mild intermittent asthma with (acute) exacerbation: Secondary | ICD-10-CM | POA: Diagnosis not present

## 2016-09-19 MED ORDER — METHYLPREDNISOLONE ACETATE 80 MG/ML IJ SUSP
120.0000 mg | Freq: Once | INTRAMUSCULAR | Status: AC
  Administered 2016-09-19: 120 mg via INTRAMUSCULAR

## 2016-09-19 MED ORDER — FLUTICASONE PROPIONATE HFA 110 MCG/ACT IN AERO: 2.0000 | INHALATION_SPRAY | Freq: Two times a day (BID) | RESPIRATORY_TRACT | 5 refills | Status: DC

## 2016-09-19 NOTE — Addendum Note (Signed)
Addended by: Aggie Hacker A on: 09/19/2016 11:12 AM   Modules accepted: Orders

## 2016-09-19 NOTE — Progress Notes (Signed)
   Subjective:    Patient ID: Tina May, female    DOB: November 24, 1945, 71 y.o.   MRN: [id number]  HPI Here for a month of worsening asthma symptoms like coughing, wheezing, and SOB. She has been using her rescue inhaler 3-4 times every day. She recently hired a company to inspect her house and they found mold growing behind a wall in her bedroom where the shower had been leaking. They plan to rip out the wall and clean all this up next week. She is staying in a hotel room right now.    Review of Systems  Constitutional: Negative.   HENT: Negative.   Eyes: Negative.   Respiratory: Positive for cough, chest tightness, shortness of breath and wheezing.   Cardiovascular: Negative.        Objective:   Physical Exam  Constitutional: She is oriented to person, place, and time. She appears well-developed and well-nourished. No distress.  Cardiovascular: Normal rate, regular rhythm, normal heart sounds and intact distal pulses.   Pulmonary/Chest: Effort normal and breath sounds normal. No respiratory distress. She has no wheezes. She has no rales.  Her airflow is restricted a bit   Neurological: She is alert and oriented to person, place, and time.          Assessment & Plan:  Asthma. Given a steroid shot. Also start using Flovent bid. Recheck prn.  [name], MD

## 2016-09-21 DIAGNOSIS — Z885 Allergy status to narcotic agent status: Secondary | ICD-10-CM | POA: Diagnosis not present

## 2016-09-21 DIAGNOSIS — M545 Low back pain: Secondary | ICD-10-CM | POA: Diagnosis not present

## 2016-09-21 DIAGNOSIS — Z888 Allergy status to other drugs, medicaments and biological substances status: Secondary | ICD-10-CM | POA: Diagnosis not present

## 2016-09-21 DIAGNOSIS — M069 Rheumatoid arthritis, unspecified: Secondary | ICD-10-CM | POA: Diagnosis not present

## 2016-09-25 DIAGNOSIS — H2513 Age-related nuclear cataract, bilateral: Secondary | ICD-10-CM | POA: Diagnosis not present

## 2016-09-29 ENCOUNTER — Other Ambulatory Visit: Payer: Self-pay | Admitting: Family Medicine

## 2016-10-03 DIAGNOSIS — I5022 Chronic systolic (congestive) heart failure: Secondary | ICD-10-CM

## 2016-10-03 HISTORY — DX: Chronic systolic (congestive) heart failure: I50.22

## 2016-10-20 ENCOUNTER — Encounter: Payer: Self-pay | Admitting: Family Medicine

## 2016-10-20 ENCOUNTER — Ambulatory Visit (INDEPENDENT_AMBULATORY_CARE_PROVIDER_SITE_OTHER): Payer: PPO | Admitting: Family Medicine

## 2016-10-20 VITALS — BP 142/97 | HR 102 | Temp 97.7°F | Ht 62.0 in | Wt 187.0 lb

## 2016-10-20 DIAGNOSIS — R0602 Shortness of breath: Secondary | ICD-10-CM

## 2016-10-20 NOTE — Progress Notes (Signed)
   Subjective:    Patient ID: Tina May, female    DOB: [birth year], 71 y.o.   MRN: [id number]  HPI Here to follow up on SOB. We saw her a few weeks ago and she described frequent SOB causing her to use her albuterol inhaler multiple times a day. We tried her on Flovent which she has used bid regularly, but this has not made much of a difference. No chest pain. In January she had normal labs inclduing Hgb, and she had a normal CXR. She also had a a cardiac workup about 10 years ago (I do not have records on this) in which she had a concerning nuclear stress test. However when she had a cardiac catheterization, her arteries were found to be clear.    Review of Systems  Constitutional: Negative.   Respiratory: Positive for shortness of breath. Negative for apnea, cough, choking, chest tightness, wheezing and stridor.   Cardiovascular: Negative.   Gastrointestinal: Negative.   Neurological: Negative.        Objective:   Physical Exam  Constitutional: She is oriented to person, place, and time. She appears well-developed and well-nourished. No distress.  Neck: Neck supple. No thyromegaly present.  Cardiovascular: Normal rate, regular rhythm, normal heart sounds and intact distal pulses.   EKG is within normal limits   Pulmonary/Chest: Effort normal and breath sounds normal. No respiratory distress. She has no wheezes. She has no rales. She exhibits no tenderness.  Abdominal: Soft. Bowel sounds are normal. She exhibits no distension and no mass. There is no tenderness. There is no rebound and no guarding.  Musculoskeletal: She exhibits no edema.  Lymphadenopathy:    She has no cervical adenopathy.  Neurological: She is alert and oriented to person, place, and time.          Assessment & Plan:  SOB. We will refer her to Pulmonary to evaluate further.  [name], MD

## 2016-10-20 NOTE — Patient Instructions (Signed)
WE NOW OFFER   [facility] FAST TRACK!!!  SAME DAY Appointments for ACUTE CARE  Such as: Sprains, Injuries, cuts, abrasions, rashes, muscle pain, joint pain, back pain Colds, flu, sore throats, headache, allergies, cough, fever  Ear pain, sinus and eye infections Abdominal pain, nausea, vomiting, diarrhea, upset stomach Animal/insect bites  3 Easy Ways to Schedule: Walk-In Scheduling Call in scheduling Mychart Sign-up: [email or web address]         

## 2016-10-24 ENCOUNTER — Telehealth: Payer: Self-pay | Admitting: Family Medicine

## 2016-10-24 NOTE — Telephone Encounter (Signed)
This is only 5 weeks and is about what I expected. She should keep this appt but I suggest calling the office twice a week to see if anyone does cancel

## 2016-10-24 NOTE — Telephone Encounter (Signed)
I spoke with pt and went over below message.  

## 2016-10-24 NOTE — Telephone Encounter (Signed)
Pt is calling to let md know she can not see pulmonologist until  12-02-16 and they do not have cancellation list. Please advise

## 2016-10-26 ENCOUNTER — Encounter (HOSPITAL_COMMUNITY): Payer: Self-pay | Admitting: Emergency Medicine

## 2016-10-26 ENCOUNTER — Inpatient Hospital Stay (HOSPITAL_COMMUNITY)
Admission: EM | Admit: 2016-10-26 | Discharge: 2016-10-29 | DRG: 291 | Disposition: A | Discharge status: Home / Self Care | Payer: PPO | Attending: Nephrology | Admitting: Nephrology

## 2016-10-26 ENCOUNTER — Emergency Department (HOSPITAL_COMMUNITY): Payer: PPO

## 2016-10-26 ENCOUNTER — Other Ambulatory Visit: Payer: Self-pay

## 2016-10-26 DIAGNOSIS — I10 Essential (primary) hypertension: Secondary | ICD-10-CM | POA: Diagnosis present

## 2016-10-26 DIAGNOSIS — I272 Pulmonary hypertension, unspecified: Secondary | ICD-10-CM | POA: Diagnosis present

## 2016-10-26 DIAGNOSIS — I509 Heart failure, unspecified: Secondary | ICD-10-CM | POA: Diagnosis not present

## 2016-10-26 DIAGNOSIS — I472 Ventricular tachycardia: Secondary | ICD-10-CM | POA: Diagnosis present

## 2016-10-26 DIAGNOSIS — J4521 Mild intermittent asthma with (acute) exacerbation: Secondary | ICD-10-CM | POA: Diagnosis not present

## 2016-10-26 DIAGNOSIS — J9601 Acute respiratory failure with hypoxia: Secondary | ICD-10-CM | POA: Diagnosis present

## 2016-10-26 DIAGNOSIS — I350 Nonrheumatic aortic (valve) stenosis: Secondary | ICD-10-CM | POA: Diagnosis not present

## 2016-10-26 DIAGNOSIS — E785 Hyperlipidemia, unspecified: Secondary | ICD-10-CM | POA: Diagnosis present

## 2016-10-26 DIAGNOSIS — I5023 Acute on chronic systolic (congestive) heart failure: Secondary | ICD-10-CM | POA: Diagnosis not present

## 2016-10-26 DIAGNOSIS — Z79899 Other long term (current) drug therapy: Secondary | ICD-10-CM

## 2016-10-26 DIAGNOSIS — I110 Hypertensive heart disease with heart failure: Principal | ICD-10-CM | POA: Diagnosis present

## 2016-10-26 DIAGNOSIS — J45909 Unspecified asthma, uncomplicated: Secondary | ICD-10-CM | POA: Diagnosis present

## 2016-10-26 DIAGNOSIS — I5021 Acute systolic (congestive) heart failure: Secondary | ICD-10-CM | POA: Diagnosis not present

## 2016-10-26 DIAGNOSIS — Z888 Allergy status to other drugs, medicaments and biological substances status: Secondary | ICD-10-CM

## 2016-10-26 DIAGNOSIS — J90 Pleural effusion, not elsewhere classified: Secondary | ICD-10-CM | POA: Diagnosis not present

## 2016-10-26 DIAGNOSIS — Z87891 Personal history of nicotine dependence: Secondary | ICD-10-CM

## 2016-10-26 DIAGNOSIS — R0602 Shortness of breath: Secondary | ICD-10-CM | POA: Diagnosis not present

## 2016-10-26 DIAGNOSIS — J4520 Mild intermittent asthma, uncomplicated: Secondary | ICD-10-CM | POA: Diagnosis present

## 2016-10-26 LAB — CBC
HCT: 41.2 % (ref 36.0–46.0)
HEMOGLOBIN: 13.5 g/dL (ref 12.0–15.0)
MCH: 32 pg (ref 26.0–34.0)
MCHC: 32.8 g/dL (ref 30.0–36.0)
MCV: 97.6 fL (ref 78.0–100.0)
Platelets: 255 10*3/uL (ref 150–400)
RBC: 4.22 MIL/uL (ref 3.87–5.11)
RDW: 14.4 % (ref 11.5–15.5)
WBC: 8.9 10*3/uL (ref 4.0–10.5)

## 2016-10-26 LAB — BASIC METABOLIC PANEL
Anion gap: 8 (ref 5–15)
BUN: 21 mg/dL — ABNORMAL HIGH (ref 6–20)
CO2: 28 mmol/L (ref 22–32)
Calcium: 8.9 mg/dL (ref 8.9–10.3)
Chloride: 108 mmol/L (ref 101–111)
Creatinine, Ser: 0.85 mg/dL (ref 0.44–1.00)
GFR calc non Af Amer: >60 mL/min (ref >60)
Glucose, Bld: 121 mg/dL — ABNORMAL HIGH (ref 65–99)
Potassium: 3.8 mmol/L (ref 3.5–5.1)
Sodium: 144 mmol/L (ref 135–145)

## 2016-10-26 LAB — TSH: TSH: 1.05 u[IU]/mL (ref 0.350–4.500)

## 2016-10-26 LAB — POCT I-STAT TROPONIN I: Troponin i, poc: 0.01 ng/mL (ref 0.00–0.08)

## 2016-10-26 LAB — PROTIME-INR
INR: 0.94
PROTHROMBIN TIME: 12.6 s (ref 11.4–15.2)

## 2016-10-26 LAB — BRAIN NATRIURETIC PEPTIDE: B Natriuretic Peptide: 483.2 pg/mL — ABNORMAL HIGH (ref 0.0–100.0)

## 2016-10-26 MED ORDER — FUROSEMIDE 10 MG/ML IJ SOLN
40.0000 mg | Freq: Two times a day (BID) | INTRAMUSCULAR | Status: DC
  Administered 2016-10-26 – 2016-10-29 (×6): 40 mg via INTRAVENOUS
  Filled 2016-10-26 (×6): qty 4

## 2016-10-26 MED ORDER — HYALURONIC ACID 20-60 MG PO CAPS: ORAL_CAPSULE | Freq: Every day | ORAL | Status: DC

## 2016-10-26 MED ORDER — ONDANSETRON HCL 4 MG/2ML IJ SOLN: 4.0000 mg | Freq: Four times a day (QID) | INTRAMUSCULAR | Status: DC | PRN

## 2016-10-26 MED ORDER — MELOXICAM 15 MG PO TABS: 15.0000 mg | ORAL_TABLET | Freq: Every day | ORAL | Status: DC

## 2016-10-26 MED ORDER — ONDANSETRON HCL 4 MG PO TABS: 4.0000 mg | ORAL_TABLET | Freq: Four times a day (QID) | ORAL | Status: DC | PRN

## 2016-10-26 MED ORDER — TURMERIC CURCUMIN 500 MG PO CAPS: 1000.0000 mg | ORAL_CAPSULE | Freq: Every day | ORAL | Status: DC

## 2016-10-26 MED ORDER — CARVEDILOL 3.125 MG PO TABS
3.1250 mg | ORAL_TABLET | Freq: Two times a day (BID) | ORAL | Status: DC
  Administered 2016-10-26 – 2016-10-27 (×3): 3.125 mg via ORAL
  Filled 2016-10-26 (×3): qty 1

## 2016-10-26 MED ORDER — FUROSEMIDE 10 MG/ML IJ SOLN
40.0000 mg | Freq: Once | INTRAMUSCULAR | Status: AC
  Administered 2016-10-26: 40 mg via INTRAVENOUS
  Filled 2016-10-26: qty 4

## 2016-10-26 MED ORDER — ACETAMINOPHEN 650 MG RE SUPP: 650.0000 mg | Freq: Four times a day (QID) | RECTAL | Status: DC | PRN

## 2016-10-26 MED ORDER — CYCLOSPORINE 0.05 % OP EMUL
1.0000 [drp] | Freq: Two times a day (BID) | OPHTHALMIC | Status: DC
  Administered 2016-10-26 – 2016-10-29 (×6): 1 [drp] via OPHTHALMIC
  Filled 2016-10-26 (×6): qty 1

## 2016-10-26 MED ORDER — ACETAMINOPHEN 325 MG PO TABS: 650.0000 mg | ORAL_TABLET | Freq: Four times a day (QID) | ORAL | Status: DC | PRN

## 2016-10-26 MED ORDER — IOPAMIDOL (ISOVUE-370) INJECTION 76%
INTRAVENOUS | Status: AC
  Administered 2016-10-26: 100 mL via INTRAVENOUS
  Filled 2016-10-26: qty 100

## 2016-10-26 MED ORDER — ASPIRIN EC 81 MG PO TBEC
81.0000 mg | DELAYED_RELEASE_TABLET | Freq: Every day | ORAL | Status: DC
  Administered 2016-10-27 – 2016-10-29 (×3): 81 mg via ORAL
  Filled 2016-10-26 (×3): qty 1

## 2016-10-26 MED ORDER — VITAMIN E 45 MG (100 UNIT) PO CAPS
200.0000 [IU] | ORAL_CAPSULE | Freq: Every day | ORAL | Status: DC
  Administered 2016-10-27 – 2016-10-28 (×2): 200 [IU] via ORAL
  Filled 2016-10-26 (×3): qty 2

## 2016-10-26 MED ORDER — ALBUTEROL SULFATE (2.5 MG/3ML) 0.083% IN NEBU: 2.5000 mg | INHALATION_SOLUTION | RESPIRATORY_TRACT | Status: DC | PRN

## 2016-10-26 MED ORDER — SODIUM CHLORIDE 0.9% FLUSH
3.0000 mL | Freq: Two times a day (BID) | INTRAVENOUS | Status: DC
  Administered 2016-10-26 – 2016-10-29 (×5): 3 mL via INTRAVENOUS

## 2016-10-26 MED ORDER — POTASSIUM CHLORIDE CRYS ER 20 MEQ PO TBCR
40.0000 meq | EXTENDED_RELEASE_TABLET | Freq: Two times a day (BID) | ORAL | Status: DC
  Administered 2016-10-26 – 2016-10-29 (×6): 40 meq via ORAL
  Filled 2016-10-26 (×6): qty 2

## 2016-10-26 MED ORDER — ROSUVASTATIN CALCIUM 5 MG PO TABS
5.0000 mg | ORAL_TABLET | Freq: Every day | ORAL | Status: DC
  Administered 2016-10-27 – 2016-10-29 (×3): 5 mg via ORAL
  Filled 2016-10-26 (×3): qty 1

## 2016-10-26 MED ORDER — HEPARIN SODIUM (PORCINE) 5000 UNIT/ML IJ SOLN
5000.0000 [IU] | Freq: Three times a day (TID) | INTRAMUSCULAR | Status: DC
  Filled 2016-10-26 (×2): qty 1

## 2016-10-26 MED ORDER — ADULT MULTIVITAMIN W/MINERALS CH
1.0000 | ORAL_TABLET | Freq: Every day | ORAL | Status: DC
  Filled 2016-10-26: qty 1

## 2016-10-26 MED ORDER — HYDROCODONE-ACETAMINOPHEN 5-325 MG PO TABS: 1.0000 | ORAL_TABLET | ORAL | Status: DC | PRN

## 2016-10-26 NOTE — ED Provider Notes (Signed)
[facility] DEPT Provider Note   CSN: [id number] Arrival date & time: 10/26/16  0919     History   Chief Complaint Chief Complaint  Patient presents with  . Shortness of Breath  . Leg Swelling    HPI Tina May is a 71 y.o. female.  HPI   Patient is a 71 year old female with history of asthma, hypertension, hyperlipidemia who presents to the ED with complaint of shortness of breath. Patient reports over the past 4 days she has had gradually worsening shortness of breath. She notes her breathing is worse with exertion or when laying flat and states she has had to sleep with multiple pillows to prop herself up. She also states she has had worsening swelling to bilateral legs over the past 4 days without associated pain. Patient states she saw her PCP earlier this week and states she has an appointment scheduled to see pulmonology the end of July. She notes she was using her home inhalers without relief. Denies fever, chills, headache, lightheadedness, cough, chest pain, wheezing, palpitations, abdominal pain, vomiting, numbness, tingling, weakness, syncope. Patient denies smoking but reports her husband smoke chronically and died this past year from lung cancer. Denies personal or family history of cardiac disease.  Past Medical History:  Diagnosis Date  . Allergy   . Arthritis   . Asthma   . 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 Cataract    starting  . Diverticulosis   . External hemorrhoids   . Hepatic cyst   . Hyperlipidemia   . Hypertension   . Pancreatic lesion 03/11/13    Patient Active Problem List   Diagnosis Date Noted  . Acute CHF ([place]) 10/26/2016  . Plantar fasciitis, bilateral 03/17/2013  . LUMBAR STRAIN 02/11/2010  . VIRAL URI 05/30/2009  . ANXIETY STATE, UNSPECIFIED 03/27/2009  . DIVERTICULITIS, ACUTE 11/15/2008  . THORACIC SPRAIN AND STRAIN 05/27/2007  . HYPERLIPIDEMIA 05/04/2007  . HYPERTENSION 05/04/2007  . ALLERGIC RHINITIS 05/04/2007  . Asthma  05/04/2007  . Osteoarthritis 05/04/2007    Past Surgical History:  Procedure Laterality Date  . ABDOMINAL HYSTERECTOMY    . COLONOSCOPY  08-06-07   per Dr. [name], diverticulae only, repeat in 10 yrs   . SMALL INTESTINE SURGERY     from small intestine    OB History    Gravida Para Term Preterm AB Living   3 3       3    SAB TAB Ectopic Multiple Live Births                   Home Medications    Prior to Admission medications   Medication Sig Start Date End Date Taking? Authorizing Provider  albuterol (PROVENTIL HFA;VENTOLIN HFA) 108 (90 Base) MCG/ACT inhaler Inhale 2 puffs into the lungs every 4 (four) hours as needed for wheezing or shortness of breath. 08/22/16  Yes [name], MD  aspirin 81 MG tablet Take 81 mg by mouth daily.     Yes [provider]  diltiazem (CARDIZEM CD) 240 MG 24 hr capsule TAKE 1 CAPSULE (240 MG TOTAL) BY MOUTH DAILY. 08/22/16  Yes [name], MD  estradiol (ESTRACE) 0.5 MG tablet Take 0.5 mg by mouth once a week.   Yes [provider]  Hyaluronic Acid-Vitamin C (HYALURONIC ACID PO) Take 1 tablet by mouth daily.   Yes [provider]  meloxicam (MOBIC) 15 MG tablet Take 1 tablet (15 mg total) by mouth daily. 08/22/16  Yes [name], MD  Multiple Vitamin (MULTIVITAMIN) tablet Take 1 tablet by mouth daily.     Yes [provider]  RESTASIS 0.05 % ophthalmic emulsion Place 1 drop into both eyes 2 (two) times daily.  09/28/13  Yes [provider]  rosuvastatin (CRESTOR) 5 MG tablet Take 1 tablet (5 mg total) by mouth daily. 08/22/16  Yes [name], MD  Turmeric Curcumin 500 MG CAPS Take 1,000 mg by mouth daily.   Yes [provider]  valsartan (DIOVAN) 160 MG tablet TAKE 1 TABLET (160 MG TOTAL) BY MOUTH DAILY. 08/22/16  Yes [name], MD  vitamin E 200 UNIT capsule Take 200 Units by mouth daily.     Yes [provider]  fluticasone (FLOVENT HFA) 110 MCG/ACT inhaler Inhale 2 puffs  into the lungs 2 (two) times daily. Patient not taking: Reported on 10/26/2016 09/19/16   [name], MD    Family History Family History  Problem Relation Age of Onset  . Hyperlipidemia Unknown   . Hypertension Unknown   . Prostate cancer Unknown   . Colon cancer Neg Hx     Social History Social History  Substance Use Topics  . Smoking status: Former Research scientist (life sciences)  . Smokeless tobacco: Never Used  . Alcohol use 4.2 oz/week    7 Glasses of wine per week     Comment: occ     Allergies   Atorvastatin and Morphine and related   Review of Systems Review of Systems  Respiratory: Positive for shortness of breath.   Cardiovascular: Positive for leg swelling.  All other systems reviewed and are negative.    Physical Exam Updated Vital Signs BP 135/90 (BP Location: Left Arm)   Pulse 95   Temp 97.4 F (36.3 C) (Oral)   Resp 17   SpO2 94%   Physical Exam  Constitutional: She is oriented to person, place, and time. She appears well-developed and well-nourished. No distress.  HENT:  Head: Normocephalic and atraumatic.  Mouth/Throat: Oropharynx is clear and moist. No oropharyngeal exudate.  Eyes: Conjunctivae and EOM are normal. Right eye exhibits no discharge. Left eye exhibits no discharge. No scleral icterus.  Neck: Normal range of motion. Neck supple.  Cardiovascular: Regular rhythm, normal heart sounds and intact distal pulses.   Mildly tachycardic, HR 105  Pulmonary/Chest: Effort normal and breath sounds normal. No respiratory distress. She has no wheezes. She has no rales. She exhibits no tenderness.  Pt appears mildly tachypneic with exertion, RR 22. Pt without increased work of breathing or signs of respiratory distress. O2 sat 94% on RA.  Abdominal: Soft. Bowel sounds are normal. She exhibits no distension and no mass. There is no tenderness. There is no rebound and no guarding. No hernia.  Musculoskeletal: Normal range of motion. She exhibits edema (1+ pitting edema  present to bilateral lower legs). She exhibits no tenderness.  FROM of BLE with 5/5 strength. No calf tenderness.   Neurological: She is alert and oriented to person, place, and time.  Skin: Skin is warm and dry. No rash noted. She is not diaphoretic.  Nursing note and vitals reviewed.    ED Treatments / Results  Labs (all labs ordered are listed, but only abnormal results are displayed) Labs Reviewed  BASIC METABOLIC PANEL - Abnormal; Notable for the following:       Result Value   Glucose, Bld 121 (*)    BUN 21 (*)    All other components within normal limits  BRAIN NATRIURETIC PEPTIDE - Abnormal; Notable for the following:    B Natriuretic Peptide 483.2 (*)    All other components within normal limits  CBC  POCT I-STAT TROPONIN I    EKG  EKG Interpretation  Date/Time:  Sunday October 26 2016 09:37:23 EDT Ventricular Rate:  101 PR Interval:    QRS Duration: 96 QT Interval:  394 QTC Calculation: 511 R Axis:   -9 Text Interpretation:  Sinus tachycardia Probable left atrial enlargement Borderline low voltage, extremity leads Consider anterior infarct Nonspecific T abnormalities, lateral leads Prolonged QT interval TWI new since previous  Confirmed by Wandra Arthurs [id number]) on 10/26/2016 11:37:49 AM       Radiology Dg Chest 2 View  Result Date: 10/26/2016 CLINICAL DATA:  Shortness of breath.  Bilateral leg swelling. EXAM: CHEST  2 VIEW COMPARISON:  05/09/2016 FINDINGS: There is cardiomegaly. There is a moderate right pleural effusion at tiny left effusion. The pulmonary vascularity is normal. No infiltrates.  No acute bone abnormality. IMPRESSION: 1. Moderate right pleural effusion.  Tiny left effusion. 2. Cardiomegaly.  No pulmonary vascular congestion. Electronically Signed   By: Lorriane Shire M.D.   On: 10/26/2016 09:48   Ct Angio Chest Pe W And/or Wo Contrast  Result Date: 10/26/2016 CLINICAL DATA:  Progressive shortness of breath. Moderate right pleural effusion. EXAM: CT  ANGIOGRAPHY CHEST WITH CONTRAST TECHNIQUE: Multidetector CT imaging of the chest was performed using the standard protocol during bolus administration of intravenous contrast. Multiplanar CT image reconstructions and MIPs were obtained to evaluate the vascular anatomy. CONTRAST:  100 cc Isovue 370 COMPARISON:  Chest x-ray dated 10/26/2016 and chest CT dated 06/02/2005 FINDINGS: Cardiovascular: Satisfactory opacification of the pulmonary arteries to the segmental level. No evidence of pulmonary embolism. New cardiomegaly. Small pericardial effusion. Mediastinum/Nodes: Multiple small lymph nodes in the mediastinum. No pathologically enlarged lymph nodes. Lungs/Pleura: Focal atelectasis in the medial aspect of the right middle lobe. Moderate right pleural effusion. Tiny left effusion. Slight emphysematous changes in both lungs. Upper Abdomen: 14 mm cystic appearing lesion in the body of the pancreas, new since the prior study. Musculoskeletal: No chest wall abnormality. No acute or significant osseous findings. Review of the MIP images confirms the above findings. IMPRESSION: 1. No pulmonary emboli. 2. New cardiomegaly with moderate right pleural effusion and small pericardial effusion. Tiny left effusion. 3. Mild emphysematous changes. 4. 14 mm cystic lesion in the body of the pancreas. I recommend this be followed up with pancreas MRI in 2 years. Electronically Signed   By: Lorriane Shire M.D.   On: 10/26/2016 12:49    Procedures Procedures (including critical care time)  Medications Ordered in ED Medications  iopamidol (ISOVUE-370) 76 % injection (100 mLs Intravenous Contrast Given 10/26/16 1222)  furosemide (LASIX) injection 40 mg (40 mg Intravenous Given 10/26/16 1315)     Initial Impression / Assessment and Plan / ED Course  I have reviewed the triage vital signs and the nursing notes.  Pertinent labs & imaging results that were available during my care of the patient were reviewed by me and  considered in my medical decision making (see chart for details).     Patient presents with worsening shortness of breath over the past 4 days with associated swelling to bilateral legs. Denies fever, cough, CP, leg pain. Endorses DOE and orthopnea. PMH of asthma, HTN, HLD. Initial vitals showed HR 105, RR 22, afebrile, BP 142/97, O2 sat 92% on RA.  On my initial evaluation patient with standing  in her room after changing into her gown. While she was walking around to sit in the bed she was noted to be tachycardic, HR 102-111. Her O2 saturation also dropped to 89% on room air with mild exertion but improved to 94% when she was sitting resting in bed. Patient continued to deny any chest pain and only reported having shortness of breath with exertion. Exam revealed mild 1+ pitting edema present to BLE extending to mid shin, no calf tenderness. Lungs CTAB. Pt tachypneic with mild exertion (walking few steps to sit in bed) but improved once sitting in bed. Remaining exam unremarkable. EKG showed sinus tachycardia, HR 100, with no acute ischemic changes. Trop negative. Labs unremarkable. CXR showed moderate right pleural effusion, tiny left effusion, cardiomegaly with no pulmonary vascular congestion noted. BNP pending. Discussed pt with Dr. Darl Householder who also evaluated pt in the ED. Due to pt's SOB, tachycardia and fluctuating hypoxia on exam will order CT chest for further evaluation.   BNP 483.2. CT chest shows no PE, new cardiomegaly with moderate right pleural effusion and small pericardial effusion, tiny left effusion, mild emphysematous changes; 44mm cystic lesion in pancreas recommend f/u with MRI in 2 years. Pt given 40mg  IV lasix in the ED. Discussed results and plan for admission with pt. Consulted cardiology regarding new onset CHF. Dr. Oval Linsey advised to admit to medicine and states they will see the pt during admission. Consulted hospitalist, Dr. Hartford Poli agrees to admission.    Final Clinical  Impressions(s) / ED Diagnoses   Final diagnoses:  Acute congestive heart failure, unspecified heart failure type [facility])    New Prescriptions New Prescriptions   No medications on file     Nona Dell, [name] 10/26/16 1352    Drenda Freeze, MD 10/27/16 [phone]

## 2016-10-26 NOTE — ED Notes (Signed)
Pt did not want breathing treatment

## 2016-10-26 NOTE — Progress Notes (Signed)
PHARMACIST - PHYSICIAN ORDER COMMUNICATION  CONCERNING: P&T Medication Policy on Herbal Medications  DESCRIPTION:  This patient's orders for:  Turmeric and Hyaluronic acid have been noted.  This product(s) is classified as an "herbal" or natural product. Due to a lack of definitive safety studies or FDA approval, nonstandard manufacturing practices, plus the potential risk of unknown drug-drug interactions while on inpatient medications, the Pharmacy and Therapeutics Committee does not permit the use of "herbal" or natural products of this type within [facility].   ACTION TAKEN: The pharmacy department is unable to verify this order at this time and your patient has been informed of this safety policy. Please reevaluate patient's clinical condition at discharge and address if the herbal or natural product(s) should be resumed at that time.  Reuel Boom, PharmD, BCPS Pager: [phone] 10/26/2016, 4:13 PM

## 2016-10-26 NOTE — ED Notes (Signed)
Attempted lab draw x1 without success

## 2016-10-26 NOTE — ED Notes (Signed)
Placed on 2 l  and satnow 96

## 2016-10-26 NOTE — ED Triage Notes (Addendum)
Pt reports intermittent SOB and asthma exacerbations since this fall. Began to have worsening SOB for the past 4 days which has not improved with home treatment and inhalers. Has also had bilateral leg swelling for the past 4 days.

## 2016-10-26 NOTE — ED Notes (Signed)
States no cp now !  saw her dr on wed and then on Thursday she got really sob and her feet started swelling, states her dr set her up with a pulmonologist but they could not get her in  For 5  Weeks,

## 2016-10-26 NOTE — H&P (Signed)
History and Physical    Tina May IZT:[id number] DOB: June 12, 1945 DOA: 10/26/2016  PCP: [name], MD  Patient coming from: Home  Chief Complaint: SOB  HPI: Tina May is a 71 y.o. female with medical history significant of asthma, HTN and dyslipidemia. Patient given to the hospital complaining about shortness of breath. Patient reported for about a week PTA she has SOB, nonproductive cough, lower extremity swelling and orthopnea. This is has been worsening so she came to the hospital for further evaluation. She reported she has done a lot of traveling this year, she went to [place], [place] and [place] since the beginning of this year. CT negative for PE Initial evaluation in the ED showed negative troponin and BNP of 483, CT angiography done and showed cardiomegaly and right-sided pleural effusion.  ED Course:  Vitals: WNL except tachycardia Labs: BNP of 483 Imaging: CT showed new cardiomegaly and right-sided pleural effusion Interventions: Given Lasix  Review of Systems:  Constitutional: negative for anorexia, fevers and sweats Eyes: negative for irritation, redness and visual disturbance Ears, nose, mouth, throat, and face: negative for earaches, epistaxis, nasal congestion and sore throat Respiratory: negative for cough, dyspnea on exertion, sputum and wheezing Cardiovascular: Per history of present illness. Gastrointestinal: negative for abdominal pain, constipation, diarrhea, melena, nausea and vomiting Genitourinary:negative for dysuria, frequency and hematuria Hematologic/lymphatic: negative for bleeding, easy bruising and lymphadenopathy Musculoskeletal:negative for arthralgias, muscle weakness and stiff joints Neurological: negative for coordination problems, gait problems, headaches and weakness Endocrine: negative for diabetic symptoms including polydipsia, polyuria and weight loss Allergic/Immunologic: negative for anaphylaxis, hay fever and urticaria  Past  Medical History:  Diagnosis Date  . Allergy   . Arthritis   . Asthma   . 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 Cataract    starting  . Diverticulosis   . External hemorrhoids   . Hepatic cyst   . Hyperlipidemia   . Hypertension   . Pancreatic lesion 03/11/13    Past Surgical History:  Procedure Laterality Date  . ABDOMINAL HYSTERECTOMY    . COLONOSCOPY  08-06-07   per Dr. [name], diverticulae only, repeat in 10 yrs   . SMALL INTESTINE SURGERY     from small intestine     reports that she has quit smoking. She has never used smokeless tobacco. She reports that she drinks about 4.2 oz of alcohol per week . She reports that she does not use drugs.  Allergies  Allergen Reactions  . Atorvastatin     REACTION: muscle cramps  . Morphine And Related Nausea And Vomiting    Family History  Problem Relation Age of Onset  . Hyperlipidemia Unknown   . Hypertension Unknown   . Prostate cancer Unknown   . Colon cancer Neg Hx     Prior to Admission medications   Medication Sig Start Date End Date Taking? Authorizing Provider  albuterol (PROVENTIL HFA;VENTOLIN HFA) 108 (90 Base) MCG/ACT inhaler Inhale 2 puffs into the lungs every 4 (four) hours as needed for wheezing or shortness of breath. 08/22/16  Yes [name], MD  aspirin 81 MG tablet Take 81 mg by mouth daily.     Yes [provider]  diltiazem (CARDIZEM CD) 240 MG 24 hr capsule TAKE 1 CAPSULE (240 MG TOTAL) BY MOUTH DAILY. 08/22/16  Yes [name], MD  estradiol (ESTRACE) 0.5 MG tablet Take 0.5 mg by mouth once a week.   Yes [provider]  Hyaluronic Acid-Vitamin C (  HYALURONIC ACID PO) Take 1 tablet by mouth daily.   Yes [provider]  meloxicam (MOBIC) 15 MG tablet Take 1 tablet (15 mg total) by mouth daily. 08/22/16  Yes [name], MD  Multiple Vitamin (MULTIVITAMIN) tablet Take 1 tablet by mouth daily.     Yes [provider]  RESTASIS 0.05 % ophthalmic emulsion Place 1 drop  into both eyes 2 (two) times daily.  09/28/13  Yes [provider]  rosuvastatin (CRESTOR) 5 MG tablet Take 1 tablet (5 mg total) by mouth daily. 08/22/16  Yes [name], MD  Turmeric Curcumin 500 MG CAPS Take 1,000 mg by mouth daily.   Yes [provider]  valsartan (DIOVAN) 160 MG tablet TAKE 1 TABLET (160 MG TOTAL) BY MOUTH DAILY. 08/22/16  Yes [name], MD  vitamin E 200 UNIT capsule Take 200 Units by mouth daily.     Yes [provider]  fluticasone (FLOVENT HFA) 110 MCG/ACT inhaler Inhale 2 puffs into the lungs 2 (two) times daily. Patient not taking: Reported on 10/26/2016 09/19/16   [name], MD    Physical Exam:  Vitals:   10/26/16 0929 10/26/16 1129  BP: (!) 145/81 135/90  Pulse: (!) 105 95  Resp: (!) 22 17  Temp: 97.4 F (36.3 C)   TempSrc: Oral   SpO2: 92% 94%    Constitutional: NAD, calm, comfortable Eyes: PERRL, lids and conjunctivae normal ENMT: Mucous membranes are moist. Posterior pharynx clear of any exudate or lesions.Normal dentition.  Neck: normal, supple, no masses, no thyromegaly Respiratory: clear to auscultation bilaterally, no wheezing, no crackles. Normal respiratory effort. No accessory muscle use.  Cardiovascular: Regular rate and rhythm, no murmurs / rubs / gallops. 2+ pedal pulses. No carotid bruits.  Abdomen: no tenderness, no masses palpated. No hepatosplenomegaly. Bowel sounds positive.  Musculoskeletal: 2+ bilateral pedal edema, pitting Skin: no rashes, lesions, ulcers. No induration Neurologic: CN 2-12 grossly intact. Sensation intact, DTR normal. Strength 5/5 in all 4.  Psychiatric: Normal judgment and insight. Alert and oriented x 3. Normal mood.   Labs on Admission: I have personally reviewed following labs and imaging studies  CBC:  Recent Labs Lab 10/26/16 1000  WBC 8.9  HGB 13.5  HCT 41.2  MCV 97.6  PLT 993   Basic Metabolic Panel:  Recent Labs Lab 10/26/16 1000  NA 144  K 3.8  CL 108   CO2 28  GLUCOSE 121*  BUN 21*  CREATININE 0.85  CALCIUM 8.9   GFR: Estimated Creatinine Clearance: 61.3 mL/min (by C-G formula based on SCr of 0.85 mg/dL). Liver Function Tests: No results for input(s): AST, ALT, ALKPHOS, BILITOT, PROT, ALBUMIN in the last 168 hours. No results for input(s): LIPASE, AMYLASE in the last 168 hours. No results for input(s): AMMONIA in the last 168 hours. Coagulation Profile: No results for input(s): INR, PROTIME in the last 168 hours. Cardiac Enzymes: No results for input(s): CKTOTAL, CKMB, CKMBINDEX, TROPONINI in the last 168 hours. BNP (last 3 results) No results for input(s): PROBNP in the last 8760 hours. HbA1C: No results for input(s): HGBA1C in the last 72 hours. CBG: No results for input(s): GLUCAP in the last 168 hours. Lipid Profile: No results for input(s): CHOL, HDL, LDLCALC, TRIG, CHOLHDL, LDLDIRECT in the last 72 hours. Thyroid Function Tests: No results for input(s): TSH, T4TOTAL, FREET4, T3FREE, THYROIDAB in the last 72 hours. Anemia Panel: No results for input(s): VITAMINB12, FOLATE, FERRITIN, TIBC, IRON, RETICCTPCT in the last 72  hours. Urine analysis:    Component Value Date/Time   COLORURINE YELLOW 03/12/2013 0123   APPEARANCEUR CLEAR 03/12/2013 0123   LABSPEC 1.023 03/12/2013 0123   PHURINE 5.5 03/12/2013 0123   GLUCOSEU NEGATIVE 03/12/2013 0123   HGBUR NEGATIVE 03/12/2013 0123   HGBUR negative 11/15/2008 0927   BILIRUBINUR n 08/22/2016 1037   KETONESUR NEGATIVE 03/12/2013 0123   PROTEINUR n 08/22/2016 1037   PROTEINUR NEGATIVE 03/12/2013 0123   UROBILINOGEN 0.2 08/22/2016 1037   UROBILINOGEN 0.2 03/12/2013 0123   NITRITE n 08/22/2016 1037   NITRITE NEGATIVE 03/12/2013 0123   LEUKOCYTESUR Negative 08/22/2016 1037   Sepsis Labs: !!!!!!!!!!!!!!!!!!!!!!!!!!!!!!!!!!!!!!!!!!!! Invalid input(s): PROCALCITONIN, LACTICIDVEN No results found for this or any previous visit (from the past 240 hour(s)).   Radiological Exams  on Admission: Dg Chest 2 View  Result Date: 10/26/2016 CLINICAL DATA:  Shortness of breath.  Bilateral leg swelling. EXAM: CHEST  2 VIEW COMPARISON:  05/09/2016 FINDINGS: There is cardiomegaly. There is a moderate right pleural effusion at tiny left effusion. The pulmonary vascularity is normal. No infiltrates.  No acute bone abnormality. IMPRESSION: 1. Moderate right pleural effusion.  Tiny left effusion. 2. Cardiomegaly.  No pulmonary vascular congestion. Electronically Signed   By: Lorriane Shire M.D.   On: 10/26/2016 09:48   Ct Angio Chest Pe W And/or Wo Contrast  Result Date: 10/26/2016 CLINICAL DATA:  Progressive shortness of breath. Moderate right pleural effusion. EXAM: CT ANGIOGRAPHY CHEST WITH CONTRAST TECHNIQUE: Multidetector CT imaging of the chest was performed using the standard protocol during bolus administration of intravenous contrast. Multiplanar CT image reconstructions and MIPs were obtained to evaluate the vascular anatomy. CONTRAST:  100 cc Isovue 370 COMPARISON:  Chest x-ray dated 10/26/2016 and chest CT dated 06/02/2005 FINDINGS: Cardiovascular: Satisfactory opacification of the pulmonary arteries to the segmental level. No evidence of pulmonary embolism. New cardiomegaly. Small pericardial effusion. Mediastinum/Nodes: Multiple small lymph nodes in the mediastinum. No pathologically enlarged lymph nodes. Lungs/Pleura: Focal atelectasis in the medial aspect of the right middle lobe. Moderate right pleural effusion. Tiny left effusion. Slight emphysematous changes in both lungs. Upper Abdomen: 14 mm cystic appearing lesion in the body of the pancreas, new since the prior study. Musculoskeletal: No chest wall abnormality. No acute or significant osseous findings. Review of the MIP images confirms the above findings. IMPRESSION: 1. No pulmonary emboli. 2. New cardiomegaly with moderate right pleural effusion and small pericardial effusion. Tiny left effusion. 3. Mild emphysematous  changes. 4. 14 mm cystic lesion in the body of the pancreas. I recommend this be followed up with pancreas MRI in 2 years. Electronically Signed   By: Lorriane Shire M.D.   On: 10/26/2016 12:49    EKG: Independently reviewed. Sinus tachycardia with possible left atrial enlargement  Assessment/Plan Principal Problem:   Acute CHF (HCC) Active Problems:   Hyperlipidemia   Benign essential HTN   Asthma    Acute CHF -Presented with SOB, orthopnea or lower extremity edema, BNP is elevated. -CT angiography showed new cardiomegaly and right-sided pleural effusion. -This is likely systolic CHF (cardiomegaly), obtain 2-D echocardiogram. No history of CAD, no valvular abnormalities -Started Lasix 40 mg twice a day, Coreg 3.125 mg twice a day. -Hold Cardizem and Diovan, likely can restart Diovan as soon as making sure renal function is stable. -Cardiology notified by EDP, they will evaluate the patient in a.m. -Follow daily weight, elevate lower extremity, restrict fluid/salt intake and follow renal function daily.  Essential hypertension -Patient was on Cardizem, Diovan both held,  started on Coreg. -Check 2-D echo.  Dyslipidemia -Continue Crestor.  Asthma -No acute issues, appears to be controlled continue home bronchodilators.   DVT prophylaxis: SQ Heparin Code Status: Full code Family Communication: Plan D/W patient Disposition Plan: Home Consults called:  Admission status: Inpatient, telemetry   [facility] A MD [facility] Pager [phone]  If 7PM-7AM, please contact night-coverage [email or web address] Password [email or web address]  10/26/2016, 2:19 PM

## 2016-10-27 ENCOUNTER — Encounter (HOSPITAL_COMMUNITY): Payer: Self-pay | Admitting: Cardiology

## 2016-10-27 ENCOUNTER — Inpatient Hospital Stay (HOSPITAL_COMMUNITY): Payer: PPO

## 2016-10-27 DIAGNOSIS — I10 Essential (primary) hypertension: Secondary | ICD-10-CM

## 2016-10-27 DIAGNOSIS — E785 Hyperlipidemia, unspecified: Secondary | ICD-10-CM

## 2016-10-27 DIAGNOSIS — I509 Heart failure, unspecified: Secondary | ICD-10-CM

## 2016-10-27 DIAGNOSIS — I350 Nonrheumatic aortic (valve) stenosis: Secondary | ICD-10-CM

## 2016-10-27 LAB — ECHOCARDIOGRAM COMPLETE
AVPHT: 285 ms
E/e' ratio: 15.97
EWDT: 197 ms
FS: 13 % — AB (ref 28–44)
HEIGHTINCHES: 62 in
IV/PV OW: 0.65
LA ID, A-P, ES: 43 mm
LA diam end sys: 43 mm
LA diam index: 2.32 cm/m2
LA vol index: 41 mL/m2
LA vol: 75.8 mL
LAVOLA4C: 78.2 mL
LV E/e' medial: 15.97
LV E/e'average: 15.97
LV PW d: 13.2 mm — AB (ref 0.6–1.1)
LV SIMPSON'S DISK: 33
LV TDI E'MEDIAL: 4.79
LV dias vol: 110 mL — AB (ref 46–106)
LV sys vol index: 40 mL/m2
LVDIAVOLIN: 59 mL/m2
LVELAT: 6.2 cm/s
LVOT SV: 40 mL
LVOT VTI: 15.8 cm
LVOT area: 2.54 cm2
LVOT diameter: 18 mm
LVOTPV: 79.1 cm/s
LVSYSVOL: 74 mL — AB (ref 14–42)
MRPISAEROA: 0.12 cm2
MV Dec: 197
MV Peak grad: 4 mmHg
MV VTI: 160 cm
MVPKAVEL: 41.7 m/s
MVPKEVEL: 99 m/s
RV LATERAL S' VELOCITY: 7.62 cm/s
S' Lateral: 5.33 cm/s
Stroke v: 36 ml
TAPSE: 12.4 mm
TDI e' lateral: 6.2
WEIGHTICAEL: 2948.87 [oz_av]

## 2016-10-27 LAB — BASIC METABOLIC PANEL
Anion gap: 9 (ref 5–15)
BUN: 18 mg/dL (ref 6–20)
CHLORIDE: 104 mmol/L (ref 101–111)
CO2: 31 mmol/L (ref 22–32)
Calcium: 8.9 mg/dL (ref 8.9–10.3)
Creatinine, Ser: 0.75 mg/dL (ref 0.44–1.00)
Glucose, Bld: 105 mg/dL — ABNORMAL HIGH (ref 65–99)
POTASSIUM: 4 mmol/L (ref 3.5–5.1)
SODIUM: 144 mmol/L (ref 135–145)

## 2016-10-27 LAB — CBC
HEMATOCRIT: 42.3 % (ref 36.0–46.0)
Hemoglobin: 13.7 g/dL (ref 12.0–15.0)
MCH: 31.1 pg (ref 26.0–34.0)
MCHC: 32.4 g/dL (ref 30.0–36.0)
MCV: 96.1 fL (ref 78.0–100.0)
PLATELETS: 272 10*3/uL (ref 150–400)
RBC: 4.4 MIL/uL (ref 3.87–5.11)
RDW: 14.3 % (ref 11.5–15.5)
WBC: 9 10*3/uL (ref 4.0–10.5)

## 2016-10-27 LAB — HEMOGLOBIN A1C
Hgb A1c MFr Bld: 5.8 % — ABNORMAL HIGH (ref 4.8–5.6)
MEAN PLASMA GLUCOSE: 120 mg/dL

## 2016-10-27 LAB — TROPONIN I

## 2016-10-27 LAB — MAGNESIUM: Magnesium: 1.9 mg/dL (ref 1.7–2.4)

## 2016-10-27 MED ORDER — IBUPROFEN 200 MG PO TABS
400.0000 mg | ORAL_TABLET | Freq: Four times a day (QID) | ORAL | Status: DC | PRN
  Administered 2016-10-27 (×2): 400 mg via ORAL
  Filled 2016-10-27 (×2): qty 2

## 2016-10-27 MED ORDER — ADULT MULTIVITAMIN W/MINERALS CH
1.0000 | ORAL_TABLET | Freq: Every day | ORAL | Status: DC
  Administered 2016-10-27 – 2016-10-29 (×3): 1 via ORAL
  Filled 2016-10-27 (×3): qty 1

## 2016-10-27 MED ORDER — IPRATROPIUM-ALBUTEROL 0.5-2.5 (3) MG/3ML IN SOLN
3.0000 mL | Freq: Four times a day (QID) | RESPIRATORY_TRACT | Status: DC
  Filled 2016-10-27: qty 3

## 2016-10-27 NOTE — Consult Note (Signed)
Cardiology Consultation:   Patient ID: Tina May; [id number]; [birth year]   Admit date: 10/26/2016 Date of Consult: 10/27/2016  Primary Care Provider: [name], MD Primary Cardiologist: New [name] Primary Electrophysiologist:  NA   Patient Profile:   Tina May is a 71 y.o. female with a hx of asthma, HTN HLD  who is being seen today for the evaluation of new CHF and NSVT at the request of Dr. Hartford Poli.  History of Present Illness:   Ms. Tina May with hx of HTN, HLD and asthma was admitted last pm for SOB, orthopnea and lower extremity edema with elevated BNP 483, and CXR with rt. Pl. Effusion and cardiomegaly.   CTA of chest with cardiomegaly, small pericardial effusion, tiny lt pleural effusion and moderate Rt pl effusion.  Her troponin poc 0.01.    No prior cardiac hx. Though she had nuc with Dr. Saintclair Halsted at [facility] in 2007 that was positive vs. Breast attenuation and underwent cardiac cath with normal coronary angiography, normal LV function.  It was decided her SOB at time was due to Asthma and nuc study was false positive.    EKG ST at 101 with poor R wave progression and TWI V6 new from 2016  Currently comfortable but some SOB with talking.  She has traveled to several places this year, [place] and [place] in Feb, [place] in April and last week or so ago in [place] to [place].  No colds or fevers.  Last week she began feeling SOB saw her PCP and pulmonology appt was made in 5 weeks.  Over the week her ankles began swelling along with the SOB.  She was sleeping on 2 pillows which was new for her and could not walk around her house without DOE.  No chest pain and no awareness of heart beat no skips or tachycardia.         She has had 24 beats of NSVT since admit.  Also one episode of idio ventricular rhythm of 5 beats.  She was not aware.  No I&O in computer no wt for today.  Will add order.    Past Medical History:  Diagnosis Date  . Allergy   . Arthritis   .  Asthma   . 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 Cataract    starting  . Diverticulosis   . External hemorrhoids   . Hepatic cyst   . Hyperlipidemia   . Hypertension   . Pancreatic lesion 03/11/13    Past Surgical History:  Procedure Laterality Date  . ABDOMINAL HYSTERECTOMY    . COLONOSCOPY  08-06-07   per Dr. [name], diverticulae only, repeat in 10 yrs   . SMALL INTESTINE SURGERY     from small intestine     Inpatient Medications: Scheduled Meds: . aspirin EC  81 mg Oral Daily  . carvedilol  3.125 mg Oral BID WC  . cycloSPORINE  1 drop Both Eyes BID  . furosemide  40 mg Intravenous BID  . heparin  5,000 Units Subcutaneous Q8H  . ipratropium-albuterol  3 mL Nebulization Q6H  . multivitamin with minerals  1 tablet Oral Daily  . potassium chloride  40 mEq Oral BID  . rosuvastatin  5 mg Oral Daily  . sodium chloride flush  3 mL Intravenous Q12H  . vitamin E  200 Units Oral Daily   Continuous Infusions:  PRN Meds: acetaminophen **OR** acetaminophen, albuterol, HYDROcodone-acetaminophen, ondansetron **OR** ondansetron (ZOFRAN) IV  Allergies:    Allergies  Allergen Reactions  . Atorvastatin     REACTION: muscle cramps  . Morphine And Related Nausea And Vomiting    Social History:   Social History   Social History  . Marital status: Married    Spouse name: N/A  . Number of children: 3  . Years of education: N/A   Occupational History  . Retired    Social History Main Topics  . Smoking status: Former Research scientist (life sciences)  . Smokeless tobacco: Never Used  . Alcohol use 4.2 oz/week    7 Glasses of wine per week     Comment: occ  . Drug use: No  . Sexual activity: Yes    Birth control/ protection: Surgical, Post-menopausal     Comment: partial hysterectomy   Other Topics Concern  . Not on file   Social History Narrative  . No narrative on file    Family History:   The patient's family history includes Healthy in her brother and sister. There is no history of Colon  cancer.  ROS:  ROS  General:no colds or fevers, + weight increase Skin:no rashes or ulcers HEENT:no blurred vision, no congestion CV:see HPI PUL:see HPI GI:no diarrhea constipation or melena, no indigestion GU:no hematuria, no dysuria MS:no joint pain, no claudication Neuro:no syncope, no lightheadedness Endo:no diabetes, no thyroid disease      Physical Exam/Data:   Vitals:   10/26/16 1524 10/26/16 1544 10/26/16 2020 10/27/16 0518  BP:  (!) 156/112 131/83 138/87  Pulse: 92 (!) 103 98 84  Resp: (!) 22 20 20 18   Temp:  98 F (36.7 C) 98.5 F (36.9 C) 98.4 F (36.9 C)  TempSrc:  Oral Oral Oral  SpO2: 96% 99% 95% 97%  Weight:  184 lb 4.9 oz (83.6 kg)    Height:  5\' 2"  (1.575 m)      Intake/Output Summary (Last 24 hours) at 10/27/16 1023 Last data filed at 10/26/16 1851  Gross per 24 hour  Intake              240 ml  Output                0 ml  Net              240 ml   Filed Weights   10/26/16 1544  Weight: 184 lb 4.9 oz (83.6 kg)   Body mass index is 33.71 kg/m.  General:  Well nourished, well developed, in no acute distress though SOB with talking. HEENT: normal, voice high pitched Lymph: no adenopathy Neck: no JVD Endocrine:  No thryomegaly Vascular: No carotid bruits;  2+ pedal pulses bil.  Cardiac:  normal S1, S2; RRR; no murmur gallup rub or click.  Lungs:  clear to auscultation bilaterally, no wheezing, rhonchi or rales  Abd: soft, nontender, no hepatomegaly  Ext: no to trace edema now Musculoskeletal:  No deformities, BUE and BLE strength normal and equal Skin: warm and dry  Neuro:  Alert and oriented X3, MAE follows commands, no focal abnormalities noted Psych:  Normal affect    Telemetry:  Telemetry was personally reviewed and demonstrates:  SR with ido ventricular beats and 24 beats of NSVT - pt not aware.   Relevant CV Studies: Cardiac cath 2007   FINAL IMPRESSION:  1.  Normal coronary angiography.  2.  Normal single-plane ventriculogram.   3.  False positive most likely secondary to breast attenuation.  Dyspnea may      be related  to the patient's symptoms of asthma.  Laboratory Data:  Chemistry  Recent Labs Lab 10/26/16 1000 10/27/16 0507  NA 144 144  K 3.8 4.0  CL 108 104  CO2 28 31  GLUCOSE 121* 105*  BUN 21* 18  CREATININE 0.85 0.75  CALCIUM 8.9 8.9  GFRNONAA >60 >60  GFRAA >60 >60  ANIONGAP 8 9    No results for input(s): PROT, ALBUMIN, AST, ALT, ALKPHOS, BILITOT in the last 168 hours. Hematology  Recent Labs Lab 10/26/16 1000 10/27/16 0507  WBC 8.9 9.0  RBC 4.22 4.40  HGB 13.5 13.7  HCT 41.2 42.3  MCV 97.6 96.1  MCH 32.0 31.1  MCHC 32.8 32.4  RDW 14.4 14.3  PLT 255 272   Cardiac EnzymesNo results for input(s): TROPONINI in the last 168 hours.   Recent Labs Lab 10/26/16 1014  TROPIPOC 0.01    BNP  Recent Labs Lab 10/26/16 1000  BNP 483.2*    DDimer No results for input(s): DDIMER in the last 168 hours.  Radiology/Studies:  Dg Chest 2 View  Result Date: 10/26/2016 CLINICAL DATA:  Shortness of breath.  Bilateral leg swelling. EXAM: CHEST  2 VIEW COMPARISON:  05/09/2016 FINDINGS: There is cardiomegaly. There is a moderate right pleural effusion at tiny left effusion. The pulmonary vascularity is normal. No infiltrates.  No acute bone abnormality. IMPRESSION: 1. Moderate right pleural effusion.  Tiny left effusion. 2. Cardiomegaly.  No pulmonary vascular congestion. Electronically Signed   By: Lorriane Shire M.D.   On: 10/26/2016 09:48   Ct Angio Chest Pe W And/or Wo Contrast  Result Date: 10/26/2016 CLINICAL DATA:  Progressive shortness of breath. Moderate right pleural effusion. EXAM: CT ANGIOGRAPHY CHEST WITH CONTRAST TECHNIQUE: Multidetector CT imaging of the chest was performed using the standard protocol during bolus administration of intravenous contrast. Multiplanar CT image reconstructions and MIPs were obtained to evaluate the vascular anatomy. CONTRAST:  100 cc Isovue 370  COMPARISON:  Chest x-ray dated 10/26/2016 and chest CT dated 06/02/2005 FINDINGS: Cardiovascular: Satisfactory opacification of the pulmonary arteries to the segmental level. No evidence of pulmonary embolism. New cardiomegaly. Small pericardial effusion. Mediastinum/Nodes: Multiple small lymph nodes in the mediastinum. No pathologically enlarged lymph nodes. Lungs/Pleura: Focal atelectasis in the medial aspect of the right middle lobe. Moderate right pleural effusion. Tiny left effusion. Slight emphysematous changes in both lungs. Upper Abdomen: 14 mm cystic appearing lesion in the body of the pancreas, new since the prior study. Musculoskeletal: No chest wall abnormality. No acute or significant osseous findings. Review of the MIP images confirms the above findings. IMPRESSION: 1. No pulmonary emboli. 2. New cardiomegaly with moderate right pleural effusion and small pericardial effusion. Tiny left effusion. 3. Mild emphysematous changes. 4. 14 mm cystic lesion in the body of the pancreas. I recommend this be followed up with pancreas MRI in 2 years. Electronically Signed   By: Lorriane Shire M.D.   On: 10/26/2016 12:49    Assessment and Plan:   1. SOB with heart failure,  On lasix 40 mg BID has been voiding but no I&O.  No weight.   Her out pt meds have been changed dilt to coreg and diovan held for now.  Echo is pending.   Could resume diovan at lower dose.  Will recheck troponin today though no chest pain.   --TSH 1.050 --held mobic as well with edema   2.  HTN, essential stable currently   3. HLD on statin   4. Asthma with inhaler  5. NSVT  Monitor  K+ 4.0, mg+ 1.9   6. Hx normal Coronary arteries in 2007, no chest pain, no mention of coronary calcification on CTA.   Signed, Cecilie Kicks, NP  10/27/2016 10:23 AM   Attending Note:   The patient was seen and examined.  Agree with assessment and plan as noted above.  Changes made to the above note as needed.  Patient seen and  independently examined with Cecilie Kicks, NP.   We discussed all aspects of the encounter. I agree with the assessment and plan as stated above.  1.   CHF:   Symptoms are c/w volume overload. CT angio has ruled out pulmonary embolus Leg edema has resolved with Lasix, bed rest and leg elevation Echo to be done today   Will have a much better idea of what is doing on after the eco  2.  HTn:   Stable   3.     I have spent a total of 40 minutes with patient reviewing hospital  notes , telemetry, EKGs, labs and examining patient as well as establishing an assessment and plan that was discussed with the patient. > 50% of time was spent in direct patient care.    Thayer Headings, Brooke Bonito., MD, [facility] 10/27/2016, 1:13 PM [address]  [place] Pager [phone]

## 2016-10-27 NOTE — Progress Notes (Signed)
PROGRESS NOTE [facility]   SUNAINA FERRANDO   PIR:[id number] DOB: 12-Jun-1945  DOA: 10/26/2016 PCP: [name], MD   Brief Narrative:  71 y.o. Female with significant Hx of asthma, HTN, and dyslipidemia reported to the ED with SOB. She had a week of SOB, nonproductive cough, lower extremity edema and orthopnea. Patient has done a lot of traveling this year, but CT was negative for PE. ED labs: negative troponin, BNP 483, CTA showed cardiomegaly and ride side pleural effusion. Patient still complaining of DOE and leg swelling, however she states the swelling has gone down.   Assessment & Plan:   Principal Problem:   Acute CHF ([place]) Active Problems:   Hyperlipidemia   Benign essential HTN   Asthma   Acute systolic CHF: -awaiting echo -Continue Lasix 40 mg BID and Coreg 3.125 mg BID - restart Diovan, kidney function was normal -Daily weights , follow-ins/outs, monitor K, mag, and renal function -on 2L O2 via [place]  Essential HTN: -continue coreg and restart diovan -monitor potasium as these cane both increase it  Asthma: -Continue home dose of bronchodilators -Start Nebs -on 2L O2 via [place]  Dyslipidemia: -Continue crestor   DVT prophylaxis: sq heparin Code Status: Full Family Communication: Family at bedside during the exam Disposition Plan: 24-48 hours pending Echo and response to lasix   Consultants:   Cardiology  Procedures:   Awaiting Echo  Antimicrobials:  none  Subjective: Patient see and examined. Patient states she is feeling better today than yesterday. She is still having dyspnea on exertion and some lower extremity swelling. Patient denies Chest pain, N/V, and productive cough.  Objective: Vitals:   10/26/16 1524 10/26/16 1544 10/26/16 2020 10/27/16 0518  BP:  (!) 156/112 131/83 138/87  Pulse: 92 (!) 103 98 84  Resp: (!) 22 20 20 18   Temp:  98 F (36.7 C) 98.5 F (36.9 C) 98.4 F (36.9 C)  TempSrc:  Oral Oral Oral  SpO2: 96% 99%  95% 97%  Weight:  184 lb 4.9 oz (83.6 kg)    Height:  5\' 2"  (1.575 m)      Intake/Output Summary (Last 24 hours) at 10/27/16 1025 Last data filed at 10/26/16 1851  Gross per 24 hour  Intake              240 ml  Output                0 ml  Net              240 ml   Filed Weights   10/26/16 1544  Weight: 184 lb 4.9 oz (83.6 kg)    Examination:  General exam: Appears fatigued, in no acute distress Respiratory system: mild wheeze and crackles at bases bilaterally Cardiovascular system: S1 & S2 heard, RRR. No JVD, murmurs, rubs or gallops Gastrointestinal system: Abdomen is nondistended, soft and nontender. No organomegaly or masses felt. Normal bowel sounds heard. Central nervous system: Alert and oriented. No focal neurological deficits. Extremities: mild 1+ pitting edema LE bilaterlly Skin: No rashes, lesions or ulcers Psychiatry: Judgement and insight appear normal. Mood & affect appropriate.    Data Reviewed: I have personally reviewed following labs and imaging studies  CBC:  Recent Labs Lab 10/26/16 1000 10/27/16 0507  WBC 8.9 9.0  HGB 13.5 13.7  HCT 41.2 42.3  MCV 97.6 96.1  PLT 255 630   Basic Metabolic Panel:  Recent Labs Lab 10/26/16 1000 10/27/16 0507  NA 144 144  K 3.8 4.0  CL 108 104  CO2 28 31  GLUCOSE 121* 105*  BUN 21* 18  CREATININE 0.85 0.75  CALCIUM 8.9 8.9  MG  --  1.9   GFR: Estimated Creatinine Clearance: 64.7 mL/min (by C-G formula based on SCr of 0.75 mg/dL). Liver Function Tests: No results for input(s): AST, ALT, ALKPHOS, BILITOT, PROT, ALBUMIN in the last 168 hours. No results for input(s): LIPASE, AMYLASE in the last 168 hours. No results for input(s): AMMONIA in the last 168 hours. Coagulation Profile:  Recent Labs Lab 10/26/16 1610  INR 0.94   Cardiac Enzymes: No results for input(s): CKTOTAL, CKMB, CKMBINDEX, TROPONINI in the last 168 hours. BNP (last 3 results) No results for input(s): PROBNP in the last 8760  hours. HbA1C:  Recent Labs  10/26/16 1610  HGBA1C 5.8*   CBG: No results for input(s): GLUCAP in the last 168 hours. Lipid Profile: No results for input(s): CHOL, HDL, LDLCALC, TRIG, CHOLHDL, LDLDIRECT in the last 72 hours. Thyroid Function Tests:  Recent Labs  10/26/16 1610  TSH 1.050   Anemia Panel: No results for input(s): VITAMINB12, FOLATE, FERRITIN, TIBC, IRON, RETICCTPCT in the last 72 hours. Sepsis Labs: No results for input(s): PROCALCITON, LATICACIDVEN in the last 168 hours.  No results found for this or any previous visit (from the past 240 hour(s)).       Radiology Studies: Dg Chest 2 View  Result Date: 10/26/2016 CLINICAL DATA:  Shortness of breath.  Bilateral leg swelling. EXAM: CHEST  2 VIEW COMPARISON:  05/09/2016 FINDINGS: There is cardiomegaly. There is a moderate right pleural effusion at tiny left effusion. The pulmonary vascularity is normal. No infiltrates.  No acute bone abnormality. IMPRESSION: 1. Moderate right pleural effusion.  Tiny left effusion. 2. Cardiomegaly.  No pulmonary vascular congestion. Electronically Signed   By: Lorriane Shire M.D.   On: 10/26/2016 09:48   Ct Angio Chest Pe W And/or Wo Contrast  Result Date: 10/26/2016 CLINICAL DATA:  Progressive shortness of breath. Moderate right pleural effusion. EXAM: CT ANGIOGRAPHY CHEST WITH CONTRAST TECHNIQUE: Multidetector CT imaging of the chest was performed using the standard protocol during bolus administration of intravenous contrast. Multiplanar CT image reconstructions and MIPs were obtained to evaluate the vascular anatomy. CONTRAST:  100 cc Isovue 370 COMPARISON:  Chest x-ray dated 10/26/2016 and chest CT dated 06/02/2005 FINDINGS: Cardiovascular: Satisfactory opacification of the pulmonary arteries to the segmental level. No evidence of pulmonary embolism. New cardiomegaly. Small pericardial effusion. Mediastinum/Nodes: Multiple small lymph nodes in the mediastinum. No pathologically  enlarged lymph nodes. Lungs/Pleura: Focal atelectasis in the medial aspect of the right middle lobe. Moderate right pleural effusion. Tiny left effusion. Slight emphysematous changes in both lungs. Upper Abdomen: 14 mm cystic appearing lesion in the body of the pancreas, new since the prior study. Musculoskeletal: No chest wall abnormality. No acute or significant osseous findings. Review of the MIP images confirms the above findings. IMPRESSION: 1. No pulmonary emboli. 2. New cardiomegaly with moderate right pleural effusion and small pericardial effusion. Tiny left effusion. 3. Mild emphysematous changes. 4. 14 mm cystic lesion in the body of the pancreas. I recommend this be followed up with pancreas MRI in 2 years. Electronically Signed   By: Lorriane Shire M.D.   On: 10/26/2016 12:49      Scheduled Meds: . aspirin EC  81 mg Oral Daily  . carvedilol  3.125 mg Oral BID WC  . cycloSPORINE  1 drop Both Eyes BID  . furosemide  40 mg Intravenous BID  . heparin  5,000 Units Subcutaneous Q8H  . ipratropium-albuterol  3 mL Nebulization Q6H  . multivitamin with minerals  1 tablet Oral Daily  . potassium chloride  40 mEq Oral BID  . rosuvastatin  5 mg Oral Daily  . sodium chloride flush  3 mL Intravenous Q12H  . vitamin E  200 Units Oral Daily   Continuous Infusions:   LOS: 1 day    Arneisha Kincannon, PA-s  If 7PM-7AM, please contact night-coverage [email or web address] Password [facility] 10/27/2016, 10:25 AM

## 2016-10-27 NOTE — Progress Notes (Signed)
  Echocardiogram 2D Echocardiogram has been performed.  Bobbye Charleston 10/27/2016, 2:21 PM

## 2016-10-27 NOTE — Care Management Note (Signed)
Case Management Note  Patient Details  Name: Tina May MRN: [id number] Date of Birth: 27-Mar-1946  Subjective/Objective:  71 y/o f admitted w/CHF. From home.                  Action/Plan:d/c plan home.   Expected Discharge Date:  10/29/16               Expected Discharge Plan:  Home/Self Care  In-House Referral:     Discharge planning Services  CM Consult  Post Acute Care Choice:    Choice offered to:     DME Arranged:    DME Agency:     HH Arranged:    HH Agency:     Status of Service:  In process, will continue to follow  If discussed at Long Length of Stay Meetings, dates discussed:    Additional Comments:  Dessa Phi, RN 10/27/2016, 3:39 PM

## 2016-10-28 LAB — BASIC METABOLIC PANEL
ANION GAP: 10 (ref 5–15)
BUN: 22 mg/dL — ABNORMAL HIGH (ref 6–20)
CO2: 30 mmol/L (ref 22–32)
Calcium: 8.6 mg/dL — ABNORMAL LOW (ref 8.9–10.3)
Chloride: 103 mmol/L (ref 101–111)
Creatinine, Ser: 0.85 mg/dL (ref 0.44–1.00)
GFR calc Af Amer: >60 mL/min (ref >60)
GFR calc non Af Amer: >60 mL/min (ref >60)
GLUCOSE: 114 mg/dL — AB (ref 65–99)
POTASSIUM: 4.3 mmol/L (ref 3.5–5.1)
Sodium: 143 mmol/L (ref 135–145)

## 2016-10-28 MED ORDER — CARVEDILOL 6.25 MG PO TABS
6.2500 mg | ORAL_TABLET | Freq: Two times a day (BID) | ORAL | Status: DC
  Administered 2016-10-28 – 2016-10-29 (×3): 6.25 mg via ORAL
  Filled 2016-10-28 (×3): qty 1

## 2016-10-28 MED ORDER — SACUBITRIL-VALSARTAN 24-26 MG PO TABS
1.0000 | ORAL_TABLET | Freq: Two times a day (BID) | ORAL | Status: DC
  Administered 2016-10-28 – 2016-10-29 (×3): 1 via ORAL
  Filled 2016-10-28 (×3): qty 1

## 2016-10-28 NOTE — Progress Notes (Signed)
Progress Note  Patient Name: Tina May Date of Encounter: 10/28/2016  Primary Cardiologist: New, Kaena Santori  Subjective   71 yo with hx of asth;ma, HTN Admitted with new symptoms of CHF and NSVT.  Echo done yesterday shows EF of 25-30%. Mild AI, mild - mod MR Mod LAE Moderate pulmonary HTN, PA pressures of 54    Inpatient Medications    Scheduled Meds: . aspirin EC  81 mg Oral Daily  . carvedilol  6.25 mg Oral BID WC  . cycloSPORINE  1 drop Both Eyes BID  . furosemide  40 mg Intravenous BID  . heparin  5,000 Units Subcutaneous Q8H  . multivitamin with minerals  1 tablet Oral Daily  . potassium chloride  40 mEq Oral BID  . rosuvastatin  5 mg Oral Daily  . sodium chloride flush  3 mL Intravenous Q12H  . vitamin E  200 Units Oral Daily   Continuous Infusions:  PRN Meds: acetaminophen **OR** acetaminophen, albuterol, HYDROcodone-acetaminophen, ondansetron **OR** ondansetron (ZOFRAN) IV   Vital Signs    Vitals:   10/27/16 1435 10/27/16 2109 10/28/16 0438 10/28/16 0822  BP: 129/90 131/76 (!) 150/96 133/75  Pulse: 100 88 93 98  Resp: 19 14 18    Temp: 98.4 F (36.9 C) 98.2 F (36.8 C) 98.6 F (37 C)   TempSrc: Oral Oral Oral   SpO2: 97% 97% 97% 94%  Weight:   178 lb 6.4 oz (80.9 kg)   Height:        Intake/Output Summary (Last 24 hours) at 10/28/16 1039 Last data filed at 10/28/16 0439  Gross per 24 hour  Intake                0 ml  Output             2700 ml  Net            -2700 ml   Filed Weights   10/26/16 1544 10/28/16 0438  Weight: 184 lb 4.9 oz (83.6 kg) 178 lb 6.4 oz (80.9 kg)    Telemetry    NSR  - Personally Reviewed  ECG     - Personally Reviewed  Physical Exam   GEN: No acute distress.   Neck: No JVD Cardiac: RRR, no murmurs, rubs, or gallops.  Respiratory: Clear to auscultation bilaterally. GI: Soft, nontender, non-distended  MS: No edema; No deformity. Neuro:  Nonfocal  Psych: Normal affect   Labs    Chemistry Recent  Labs Lab 10/26/16 1000 10/27/16 0507 10/28/16 0541  NA 144 144 143  K 3.8 4.0 4.3  CL 108 104 103  CO2 28 31 30   GLUCOSE 121* 105* 114*  BUN 21* 18 22*  CREATININE 0.85 0.75 0.85  CALCIUM 8.9 8.9 8.6*  GFRNONAA >60 >60 >60  GFRAA >60 >60 >60  ANIONGAP 8 9 10      Hematology Recent Labs Lab 10/26/16 1000 10/27/16 0507  WBC 8.9 9.0  RBC 4.22 4.40  HGB 13.5 13.7  HCT 41.2 42.3  MCV 97.6 96.1  MCH 32.0 31.1  MCHC 32.8 32.4  RDW 14.4 14.3  PLT 255 272    Cardiac Enzymes Recent Labs Lab 10/27/16 1106  TROPONINI <0.03    Recent Labs Lab 10/26/16 1014  TROPIPOC 0.01     BNP Recent Labs Lab 10/26/16 1000  BNP 483.2*     DDimer No results for input(s): DDIMER in the last 168 hours.   Radiology    Ct Angio Chest Pe W  And/or Wo Contrast  Result Date: 10/26/2016 CLINICAL DATA:  Progressive shortness of breath. Moderate right pleural effusion. EXAM: CT ANGIOGRAPHY CHEST WITH CONTRAST TECHNIQUE: Multidetector CT imaging of the chest was performed using the standard protocol during bolus administration of intravenous contrast. Multiplanar CT image reconstructions and MIPs were obtained to evaluate the vascular anatomy. CONTRAST:  100 cc Isovue 370 COMPARISON:  Chest x-ray dated 10/26/2016 and chest CT dated 06/02/2005 FINDINGS: Cardiovascular: Satisfactory opacification of the pulmonary arteries to the segmental level. No evidence of pulmonary embolism. New cardiomegaly. Small pericardial effusion. Mediastinum/Nodes: Multiple small lymph nodes in the mediastinum. No pathologically enlarged lymph nodes. Lungs/Pleura: Focal atelectasis in the medial aspect of the right middle lobe. Moderate right pleural effusion. Tiny left effusion. Slight emphysematous changes in both lungs. Upper Abdomen: 14 mm cystic appearing lesion in the body of the pancreas, new since the prior study. Musculoskeletal: No chest wall abnormality. No acute or significant osseous findings. Review of the  MIP images confirms the above findings. IMPRESSION: 1. No pulmonary emboli. 2. New cardiomegaly with moderate right pleural effusion and small pericardial effusion. Tiny left effusion. 3. Mild emphysematous changes. 4. 14 mm cystic lesion in the body of the pancreas. I recommend this be followed up with pancreas MRI in 2 years. Electronically Signed   By: Lorriane Shire M.D.   On: 10/26/2016 12:49    Cardiac Studies     Patient Profile     71 y.o. female with newly diagnosed systolic CHF   Assessment & Plan    1.  Acute systolic CHF:  EF 43-15%  Was on Diltiazem ( which has been changed to Coreg) Is on valsartan - which will will substitute entresto, titrate up as tolerated . Continue  lasix and potassium  I/O are -3.4 liters so far this admission  Add aldactone later  Has had a normal cath in the past.  Would suggest a Lexiscan myoview in a day or so ( we could even do as OP )   2. Essential HTN:  Change meds as outlines above.   Signed, Mertie Moores, MD  10/28/2016, 10:39 AM

## 2016-10-28 NOTE — Progress Notes (Signed)
PT  Note  Patient Details Name: Tina May MRN: [id number] DOB: [birth year]   Cancelled Treatment:     PT order received but eval deferred.  Pt reports she is IND with mobility in room and has been back and forth to bathroom unassisted.  PT deferred at this time with RN aware.  Please reorder if pt status changes.   Melana Hingle 10/28/2016, 11:31 AM

## 2016-10-28 NOTE — Progress Notes (Signed)
I agree with the assessment of the previous nurse. Artemio Aly RN

## 2016-10-28 NOTE — Progress Notes (Signed)
Refused IV insertion at this time.

## 2016-10-28 NOTE — Progress Notes (Signed)
[name]  PROGRESS NOTE [facility]   JILIAN WEST   EQA:[id number] DOB: Apr 07, 1946  DOA: 10/26/2016 PCP: [name], MD   Brief Narrative:  71 y.o. Female with significant Hx of asthma. HTN, and dyslipidemia reported to the ED with SOB. Pt had about 1 week of SOB, nonproductive cough, LE edema and orthopnea. Pt has significant Hx of traveling, but CT was negative for PE. ED labs: negative troponin, BNP 483, CTA showed cardiomegaly and ride side pleural effusion. Pt SOB has improved and edema is gone. Patient has echo done yesterday with EF of 25-30%. Patient is very concerned about how her length and quality of life will be effected by this.   Assessment & Plan:   Principal Problem:   Acute CHF ([place]) Active Problems:   Hyperlipidemia   Benign essential HTN   Asthma  Acute systolic CHF: -Echo: 97-98% EF - continue 40 mg lasix bid -increased coreg to 6.25 due to HTN, possibly restarting diovan pending cardiology recommendation -daily weights, ins/outs, monitor potassium, and renal function -On room air now -Cardiology consulted on next step, possible cath?  Essential HTN: -increased coreg dose, possibly restarting diovan as stated above  Asthma: -Continue bronchodilators and nebs -now on room air  Dyslipidemia: Continue crestor  DVT prophylaxis: sq heparin Code Status: FULL Family Communication: None at bedside Disposition Plan: 24-48 hours pending cardiology recommendations. Possible cath.   Consultants:   Cardiology  Procedures:   Echo: ------------------------------------------------------------------- Study Conclusions  - Left ventricle: The cavity size was severely dilated. Systolic   function was severely reduced. The estimated ejection fraction   was in the range of 25% to 30%. Diffuse hypokinesis. Doppler   parameters are consistent with both elevated ventricular   end-diastolic filling pressure and elevated left atrial filling   pressure. -  Aortic valve: There was mild regurgitation. - Mitral valve: There was mild to moderate regurgitation. - Left atrium: The atrium was moderately dilated. - Right atrium: The atrium was mildly dilated. - Atrial septum: No defect or patent foramen ovale was identified. - Tricuspid valve: There was moderate regurgitation. - Pulmonary arteries: PA peak pressure: 54 mm Hg (S). - Pericardium, extracardiac: Small mostly posterior pericardial   effusion no tamponade.  Antimicrobials:  None  Subjective: Patient seen and examined. Patient admits SOB has improved, is concerned about how decreased EF with effect her life short term as well as long term. Patient denies chest pain, N/V, edema.  Objective: Vitals:   10/27/16 1435 10/27/16 2109 10/28/16 0438 10/28/16 0822  BP: 129/90 131/76 (!) 150/96 133/75  Pulse: 100 88 93 98  Resp: 19 14 18    Temp: 98.4 F (36.9 C) 98.2 F (36.8 C) 98.6 F (37 C)   TempSrc: Oral Oral Oral   SpO2: 97% 97% 97% 94%  Weight:   178 lb 6.4 oz (80.9 kg)   Height:        Intake/Output Summary (Last 24 hours) at 10/28/16 1019 Last data filed at 10/28/16 0439  Gross per 24 hour  Intake                0 ml  Output             2700 ml  Net            -2700 ml   Filed Weights   10/26/16 1544 10/28/16 0438  Weight: 184 lb 4.9 oz (83.6 kg) 178 lb 6.4 oz (80.9 kg)    Examination:  General exam: Appears  stressed and upset about echo results, in no acute distress Respiratory system: Mild wheeze at bases, but imprioved. Cardiovascular system: S1 & S2 heard, RRR. No JVD, murmurs, rubs or gallops Gastrointestinal system: Abdomen is nondistended, soft and nontender. No organomegaly or masses felt. Normal bowel sounds heard. Central nervous system: Alert and oriented. No focal neurological deficits. Extremities: No pedal edema Skin: No rashes, lesions or ulcers Psychiatry: Judgement and insight appear normal. Mood & affect appropriate.    Data Reviewed: I have  personally reviewed following labs and imaging studies  CBC:  Recent Labs Lab 10/26/16 1000 10/27/16 0507  WBC 8.9 9.0  HGB 13.5 13.7  HCT 41.2 42.3  MCV 97.6 96.1  PLT 255 448   Basic Metabolic Panel:  Recent Labs Lab 10/26/16 1000 10/27/16 0507 10/28/16 0541  NA 144 144 143  K 3.8 4.0 4.3  CL 108 104 103  CO2 28 31 30   GLUCOSE 121* 105* 114*  BUN 21* 18 22*  CREATININE 0.85 0.75 0.85  CALCIUM 8.9 8.9 8.6*  MG  --  1.9  --    GFR: Estimated Creatinine Clearance: 59.8 mL/min (by C-G formula based on SCr of 0.85 mg/dL). Liver Function Tests: No results for input(s): AST, ALT, ALKPHOS, BILITOT, PROT, ALBUMIN in the last 168 hours. No results for input(s): LIPASE, AMYLASE in the last 168 hours. No results for input(s): AMMONIA in the last 168 hours. Coagulation Profile:  Recent Labs Lab 10/26/16 1610  INR 0.94   Cardiac Enzymes:  Recent Labs Lab 10/27/16 1106  TROPONINI <0.03   BNP (last 3 results) No results for input(s): PROBNP in the last 8760 hours. HbA1C:  Recent Labs  10/26/16 1610  HGBA1C 5.8*   CBG: No results for input(s): GLUCAP in the last 168 hours. Lipid Profile: No results for input(s): CHOL, HDL, LDLCALC, TRIG, CHOLHDL, LDLDIRECT in the last 72 hours. Thyroid Function Tests:  Recent Labs  10/26/16 1610  TSH 1.050   Anemia Panel: No results for input(s): VITAMINB12, FOLATE, FERRITIN, TIBC, IRON, RETICCTPCT in the last 72 hours. Sepsis Labs: No results for input(s): PROCALCITON, LATICACIDVEN in the last 168 hours.  No results found for this or any previous visit (from the past 240 hour(s)).       Radiology Studies: Ct Angio Chest Pe W And/or Wo Contrast  Result Date: 10/26/2016 CLINICAL DATA:  Progressive shortness of breath. Moderate right pleural effusion. EXAM: CT ANGIOGRAPHY CHEST WITH CONTRAST TECHNIQUE: Multidetector CT imaging of the chest was performed using the standard protocol during bolus administration of  intravenous contrast. Multiplanar CT image reconstructions and MIPs were obtained to evaluate the vascular anatomy. CONTRAST:  100 cc Isovue 370 COMPARISON:  Chest x-ray dated 10/26/2016 and chest CT dated 06/02/2005 FINDINGS: Cardiovascular: Satisfactory opacification of the pulmonary arteries to the segmental level. No evidence of pulmonary embolism. New cardiomegaly. Small pericardial effusion. Mediastinum/Nodes: Multiple small lymph nodes in the mediastinum. No pathologically enlarged lymph nodes. Lungs/Pleura: Focal atelectasis in the medial aspect of the right middle lobe. Moderate right pleural effusion. Tiny left effusion. Slight emphysematous changes in both lungs. Upper Abdomen: 14 mm cystic appearing lesion in the body of the pancreas, new since the prior study. Musculoskeletal: No chest wall abnormality. No acute or significant osseous findings. Review of the MIP images confirms the above findings. IMPRESSION: 1. No pulmonary emboli. 2. New cardiomegaly with moderate right pleural effusion and small pericardial effusion. Tiny left effusion. 3. Mild emphysematous changes. 4. 14 mm cystic lesion in the body of  the pancreas. I recommend this be followed up with pancreas MRI in 2 years. Electronically Signed   By: Lorriane Shire M.D.   On: 10/26/2016 12:49      Scheduled Meds: . aspirin EC  81 mg Oral Daily  . carvedilol  6.25 mg Oral BID WC  . cycloSPORINE  1 drop Both Eyes BID  . furosemide  40 mg Intravenous BID  . heparin  5,000 Units Subcutaneous Q8H  . multivitamin with minerals  1 tablet Oral Daily  . potassium chloride  40 mEq Oral BID  . rosuvastatin  5 mg Oral Daily  . sodium chloride flush  3 mL Intravenous Q12H  . vitamin E  200 Units Oral Daily   Continuous Infusions:   LOS: 2 days     Aiva Miskell, PA-s  If 7PM-7AM, please contact night-coverage [email or web address] Password [facility] 10/28/2016, 10:19 AM

## 2016-10-28 NOTE — Progress Notes (Signed)
OT Cancellation Note  Patient Details Name: Tina May MRN: [id number] DOB: 05-21-1945   Cancelled Treatment:    Reason Eval/Treat Not Completed: OT screened, no needs identified, will sign off  [facility], OT/L  [phone] 10/28/2016 10/28/2016, 11:32 AM

## 2016-10-29 DIAGNOSIS — I5021 Acute systolic (congestive) heart failure: Secondary | ICD-10-CM

## 2016-10-29 LAB — BASIC METABOLIC PANEL
Anion gap: 9 (ref 5–15)
BUN: 20 mg/dL (ref 6–20)
CHLORIDE: 102 mmol/L (ref 101–111)
CO2: 29 mmol/L (ref 22–32)
Calcium: 8.8 mg/dL — ABNORMAL LOW (ref 8.9–10.3)
Creatinine, Ser: 0.72 mg/dL (ref 0.44–1.00)
GFR calc Af Amer: >60 mL/min (ref >60)
GFR calc non Af Amer: >60 mL/min (ref >60)
GLUCOSE: 118 mg/dL — AB (ref 65–99)
POTASSIUM: 4.4 mmol/L (ref 3.5–5.1)
Sodium: 140 mmol/L (ref 135–145)

## 2016-10-29 LAB — MAGNESIUM: Magnesium: 2 mg/dL (ref 1.7–2.4)

## 2016-10-29 MED ORDER — FUROSEMIDE 40 MG PO TABS: 40.0000 mg | ORAL_TABLET | Freq: Every day | ORAL | Status: DC

## 2016-10-29 MED ORDER — CARVEDILOL 6.25 MG PO TABS: 6.2500 mg | ORAL_TABLET | Freq: Two times a day (BID) | ORAL | 0 refills | Status: DC

## 2016-10-29 MED ORDER — FUROSEMIDE 40 MG PO TABS: 40.0000 mg | ORAL_TABLET | Freq: Every day | ORAL | 0 refills | Status: DC

## 2016-10-29 MED ORDER — SACUBITRIL-VALSARTAN 24-26 MG PO TABS: 1.0000 | ORAL_TABLET | Freq: Two times a day (BID) | ORAL | 0 refills | Status: DC

## 2016-10-29 MED ORDER — POTASSIUM CHLORIDE CRYS ER 20 MEQ PO TBCR: 20.0000 meq | EXTENDED_RELEASE_TABLET | Freq: Every day | ORAL | Status: DC

## 2016-10-29 MED ORDER — POTASSIUM CHLORIDE CRYS ER 20 MEQ PO TBCR: 20.0000 meq | EXTENDED_RELEASE_TABLET | Freq: Every day | ORAL | 0 refills | Status: DC

## 2016-10-29 NOTE — Progress Notes (Signed)
Progress Note  Patient Name: Tina May Date of Encounter: 10/29/2016  Primary Cardiologist: New, Jenaya Saar  Subjective   71 yo with hx of asth;ma, HTN Admitted with new symptoms of CHF and NSVT  Patient is in good spirits this morning. Has been up and walking, reports feeling much improved. Interested on when she gets to go home. Also curious if she will be doing the Nuclear Stress test as an outpatient or during this admission. Friends with [name], PA-C.  Inpatient Medications    Scheduled Meds: . aspirin EC  81 mg Oral Daily  . carvedilol  6.25 mg Oral BID WC  . cycloSPORINE  1 drop Both Eyes BID  . furosemide  40 mg Intravenous BID  . heparin  5,000 Units Subcutaneous Q8H  . multivitamin with minerals  1 tablet Oral Daily  . potassium chloride  40 mEq Oral BID  . rosuvastatin  5 mg Oral Daily  . sacubitril-valsartan  1 tablet Oral BID  . sodium chloride flush  3 mL Intravenous Q12H  . vitamin E  200 Units Oral Daily   Continuous Infusions:  PRN Meds: acetaminophen **OR** acetaminophen, albuterol, HYDROcodone-acetaminophen, ondansetron **OR** ondansetron (ZOFRAN) IV   Vital Signs    Vitals:   10/28/16 0822 10/28/16 1630 10/28/16 2145 10/29/16 0506  BP: 133/75 126/80 115/75 127/84  Pulse: 98  90 88  Resp:   18 16  Temp:   97.5 F (36.4 C) 98.2 F (36.8 C)  TempSrc:   Oral Oral  SpO2: 94%  94% 94%  Weight:    174 lb 13.2 oz (79.3 kg)  Height:        Intake/Output Summary (Last 24 hours) at 10/29/16 0833 Last data filed at 10/29/16 0804  Gross per 24 hour  Intake              410 ml  Output             3900 ml  Net            -3490 ml   Filed Weights   10/26/16 1544 10/28/16 0438 10/29/16 0506  Weight: 184 lb 4.9 oz (83.6 kg) 178 lb 6.4 oz (80.9 kg) 174 lb 13.2 oz (79.3 kg)    Telemetry    SR - Personally Reviewed   Physical Exam   GEN: Well nourished, well developed HEENT: normal  Neck: no JVD, carotid bruits, or masses Cardiac:  RRR. no murmurs, rubs, or gallops,no edema. Intact distal pulses bilaterally.  Respiratory: + expiratory wheezing and cough. normal work of breathing GI: soft, nontender, nondistended, + BS MS: no deformity or atrophy  Skin: warm and dry, no rash Neuro: Alert and Oriented x 3, Strength and sensation are intact Psych:   Full affect  Labs    Chemistry Recent Labs Lab 10/26/16 1000 10/27/16 0507 10/28/16 0541  NA 144 144 143  K 3.8 4.0 4.3  CL 108 104 103  CO2 28 31 30   GLUCOSE 121* 105* 114*  BUN 21* 18 22*  CREATININE 0.85 0.75 0.85  CALCIUM 8.9 8.9 8.6*  GFRNONAA >60 >60 >60  GFRAA >60 >60 >60  ANIONGAP 8 9 10      Hematology Recent Labs Lab 10/26/16 1000 10/27/16 0507  WBC 8.9 9.0  RBC 4.22 4.40  HGB 13.5 13.7  HCT 41.2 42.3  MCV 97.6 96.1  MCH 32.0 31.1  MCHC 32.8 32.4  RDW 14.4 14.3  PLT 255 272    Cardiac Enzymes Recent Labs  Lab 10/27/16 1106  TROPONINI <0.03    Recent Labs Lab 10/26/16 1014  TROPIPOC 0.01     BNP Recent Labs Lab 10/26/16 1000  BNP 483.2*     DDimer No results for input(s): DDIMER in the last 168 hours.   Radiology    No results found.  Cardiac Studies   Transthoracic Echocardiography (10/27/2016) - Left ventricle: The cavity size was severely dilated. Systolic   function was severely reduced. The estimated ejection fraction   was in the range of 25% to 30%. Diffuse hypokinesis. Doppler   parameters are consistent with both elevated ventricular   end-diastolic filling pressure and elevated left atrial filling   pressure. - Aortic valve: There was mild regurgitation. - Mitral valve: There was mild to moderate regurgitation. - Left atrium: The atrium was moderately dilated. - Right atrium: The atrium was mildly dilated. - Atrial septum: No defect or patent foramen ovale was identified. - Tricuspid valve: There was moderate regurgitation. - Pulmonary arteries: PA peak pressure: 54 mm Hg (S). - Pericardium,  extracardiac: Small mostly posterior pericardial   effusion no tamponade.   Patient Profile     71 y.o. female with newly diagnosed systolic CHF  Assessment & Plan    1. Acute systolic CHF EF 32-95%: Previously on Diltiazem which has been changed to Coreg. Net loss this admission is  -5.9 liters, Weight 184 >>> 174. Cr 0.85, K 4.3 -- Recommend transitioning to oral lasix, clinically patient is much improved. -- Will ask Dr. Cathie Olden when he wants to add Aldactone and if he wants myoview as inpatient or outpatient.  2. Essential HTN:  BP controlled 127/84 this morning.  Kristopher Glee, PA-C  10/29/2016, 8:33 AM     Attending Note:   The patient was seen and examined.  Agree with assessment and plan as noted above.  Changes made to the above note as needed.  Patient seen and independently examined with Delos Haring, PA .   We discussed all aspects of the encounter. I agree with the assessment and plan as stated above.  1. Acute on chronic chf Has done well on the entresto and coreg Will be able to go home today Will see rhonda in 1-2 week and will see me in several months    I have spent a total of 40 minutes with patient reviewing hospital  notes , telemetry, EKGs, labs and examining patient as well as establishing an assessment and plan that was discussed with the patient. > 50% of time was spent in direct patient care.    Thayer Headings, Brooke Bonito., MD, [facility] 10/29/2016, 12:34 PM [address]  [place] Pager [phone]

## 2016-10-29 NOTE — Discharge Summary (Signed)
Physician Discharge Summary  Tina May LDJ:[id number] DOB: 1946-01-17 DOA: 10/26/2016  PCP: [name], MD  Admit date: 10/26/2016 Discharge date: 10/29/2016  Admitted From:home Disposition:home  Recommendations for Outpatient Follow-up:  1. Follow up with PCP in 1-2 weeks 2. Please obtain BMP/CBC in one week  Home Health:no Equipment/Devices:no Discharge Condition:stable CODE STATUS:full code Diet recommendation:heart healthy  Brief/Interim Summary: 71 year old female with history of mild intermittent asthma, hypertension, dyslipidemia presented with shortness of breath and lower extremity edema. Echocardiogram with EF of 25-30% consistent with acute systolic congestive heart failure.   #Acute systolic congestive heart failure: -EF with low systolic function around 03-00% with diffuse hypokinesis. Treated with IV Lasix, discontinue diltiazem and Diovan and switched to Coreg and Entresto. Patient clinically improved. Hypoxia resolved. Education provided to the patient regarding congestive heart failure and medication. Patient will follow-up with cardiologist in a week. May consider adding Aldactone as an outpatient. Cardiologist planning to do outpatient stress test. Patient is able to ambulate. She was net negative by about 6.6 L since admission. I discussed with the cardiologist today. Recommended low-salt diet, daily weight monitoring at home. Patient verbalized understanding. She has no chest pain shortness of breath or lower extremity edema today.  #Acute respiratory failure with hypoxia: Resolved now.  #Hypertension: Continue current medication. Monitor blood pressure. Tolerating current medications.  #Hyperlipidemia: Continue statin.  Generalized arthritis/osteoarthritis: Continue pain management, supportive care. Recommended to follow-up with orthopedics outpatient.  Patient is clinically improved. Verbalized understanding of follow-up instructions. Tolerating  medications well. Patient is medically stable to transfer her care to outpatient with close follow-up.  Discharge Diagnoses:  Principal Problem:   Acute CHF ([place]) Active Problems:   Hyperlipidemia   Benign essential HTN   Asthma    Discharge Instructions  Discharge Instructions    (HEART FAILURE PATIENTS) Call MD:  Anytime you have any of the following symptoms: 1) 3 pound weight gain in 24 hours or 5 pounds in 1 week 2) shortness of breath, with or without a dry hacking cough 3) swelling in the hands, feet or stomach 4) if you have to sleep on extra pillows at night in order to breathe.    Complete by:  As directed    Call MD for:  difficulty breathing, headache or visual disturbances    Complete by:  As directed    Call MD for:  extreme fatigue    Complete by:  As directed    Call MD for:  hives    Complete by:  As directed    Call MD for:  persistant dizziness or light-headedness    Complete by:  As directed    Call MD for:  persistant nausea and vomiting    Complete by:  As directed    Call MD for:  severe uncontrolled pain    Complete by:  As directed    Call MD for:  temperature >100.4    Complete by:  As directed    Diet - low sodium heart healthy    Complete by:  As directed    Discharge instructions    Complete by:  As directed    Please follow up with cardiologist in a week to monitor blood pressure, labs. Also to discuss about possible outpatient stress test.`   Increase activity slowly    Complete by:  As directed      Allergies as of 10/29/2016      Reactions   Atorvastatin    REACTION: muscle cramps   Morphine  And Related Nausea And Vomiting      Medication List    STOP taking these medications   diltiazem 240 MG 24 hr capsule Commonly known as:  CARDIZEM CD   valsartan 160 MG tablet Commonly known as:  DIOVAN     TAKE these medications   albuterol 108 (90 Base) MCG/ACT inhaler Commonly known as:  PROVENTIL HFA;VENTOLIN HFA Inhale 2 puffs into  the lungs every 4 (four) hours as needed for wheezing or shortness of breath.   aspirin 81 MG tablet Take 81 mg by mouth daily.   carvedilol 6.25 MG tablet Commonly known as:  COREG Take 1 tablet (6.25 mg total) by mouth 2 (two) times daily with a meal.   estradiol 0.5 MG tablet Commonly known as:  ESTRACE Take 0.5 mg by mouth once a week.   fluticasone 110 MCG/ACT inhaler Commonly known as:  FLOVENT HFA Inhale 2 puffs into the lungs 2 (two) times daily.   furosemide 40 MG tablet Commonly known as:  LASIX Take 1 tablet (40 mg total) by mouth daily.   HYALURONIC ACID PO Take 1 tablet by mouth daily.   meloxicam 15 MG tablet Commonly known as:  MOBIC Take 1 tablet (15 mg total) by mouth daily.   multivitamin tablet Take 1 tablet by mouth daily.   potassium chloride SA 20 MEQ tablet Commonly known as:  K-DUR,KLOR-CON Take 1 tablet (20 mEq total) by mouth daily. Start taking on:  10/30/2016   RESTASIS 0.05 % ophthalmic emulsion Generic drug:  cycloSPORINE Place 1 drop into both eyes 2 (two) times daily.   rosuvastatin 5 MG tablet Commonly known as:  CRESTOR Take 1 tablet (5 mg total) by mouth daily.   sacubitril-valsartan 24-26 MG Commonly known as:  ENTRESTO Take 1 tablet by mouth 2 (two) times daily.   Turmeric Curcumin 500 MG Caps Take 1,000 mg by mouth daily.   vitamin E 200 UNIT capsule Take 200 Units by mouth daily.      Follow-up Information    [name], MD. Schedule an appointment as soon as possible for a visit in 1 week(s).   Specialty:  Family Medicine Contact information: [place] [address] [phone]        Barrett, Evelene Croon, PA-C. Schedule an appointment as soon as possible for a visit in 1 week(s).   Specialties:  Cardiology, Radiology Contact information: [place] [address] [phone]        Nahser, Wonda Cheng, MD. Schedule an appointment as soon as possible for a visit  in 1 week(s).   Specialty:  Cardiology Why:  or follow up with [facility] information: [address] [place] [address] [phone]          Allergies  Allergen Reactions  . Atorvastatin     REACTION: muscle cramps  . Morphine And Related Nausea And Vomiting    Consultations: Cardiologist  Procedures/Studies: Echocardiogram  Subjective: Seen and examined at bedside. Reported doing well. Denied headache, dizziness, nausea, vomiting, chest pain, shortness of breath. No lower extremity edema. Feels better. Able to ambulate without difficulties.  Discharge Exam: Vitals:   10/28/16 2145 10/29/16 0506  BP: 115/75 127/84  Pulse: 90 88  Resp: 18 16  Temp: 97.5 F (36.4 C) 98.2 F (36.8 C)   Vitals:   10/28/16 0822 10/28/16 1630 10/28/16 2145 10/29/16 0506  BP: 133/75 126/80 115/75 127/84  Pulse: 98  90 88  Resp:   18 16  Temp:   97.5 F (36.4 C) 98.2 F (36.8 C)  TempSrc:   Oral Oral  SpO2: 94%  94% 94%  Weight:    79.3 kg (174 lb 13.2 oz)  Height:        General: Pt is alert, awake, not in acute distress Cardiovascular: RRR, S1/S2 +, no rubs, no gallops Respiratory: CTA bilaterally, no wheezing, no rhonchi Abdominal: Soft, NT, ND, bowel sounds + Extremities: no edema, no cyanosis    The results of significant diagnostics from this hospitalization (including imaging, microbiology, ancillary and laboratory) are listed below for reference.     Microbiology: No results found for this or any previous visit (from the past 240 hour(s)).   Labs: BNP (last 3 results)  Recent Labs  10/26/16 1000  BNP 865.7*   Basic Metabolic Panel:  Recent Labs Lab 10/26/16 1000 10/27/16 0507 10/28/16 0541 10/29/16 0648  NA 144 144 143 140  K 3.8 4.0 4.3 4.4  CL 108 104 103 102  CO2 28 31 30 29   GLUCOSE 121* 105* 114* 118*  BUN 21* 18 22* 20  CREATININE 0.85 0.75 0.85 0.72  CALCIUM 8.9 8.9 8.6* 8.8*  MG  --  1.9  --  2.0   Liver  Function Tests: No results for input(s): AST, ALT, ALKPHOS, BILITOT, PROT, ALBUMIN in the last 168 hours. No results for input(s): LIPASE, AMYLASE in the last 168 hours. No results for input(s): AMMONIA in the last 168 hours. CBC:  Recent Labs Lab 10/26/16 1000 10/27/16 0507  WBC 8.9 9.0  HGB 13.5 13.7  HCT 41.2 42.3  MCV 97.6 96.1  PLT 255 272   Cardiac Enzymes:  Recent Labs Lab 10/27/16 1106  TROPONINI <0.03   BNP: Invalid input(s): POCBNP CBG: No results for input(s): GLUCAP in the last 168 hours. D-Dimer No results for input(s): DDIMER in the last 72 hours. Hgb A1c  Recent Labs  10/26/16 1610  HGBA1C 5.8*   Lipid Profile No results for input(s): CHOL, HDL, LDLCALC, TRIG, CHOLHDL, LDLDIRECT in the last 72 hours. Thyroid function studies  Recent Labs  10/26/16 1610  TSH 1.050   Anemia work up No results for input(s): VITAMINB12, FOLATE, FERRITIN, TIBC, IRON, RETICCTPCT in the last 72 hours. Urinalysis    Component Value Date/Time   COLORURINE YELLOW 03/12/2013 0123   APPEARANCEUR CLEAR 03/12/2013 0123   LABSPEC 1.023 03/12/2013 0123   PHURINE 5.5 03/12/2013 0123   GLUCOSEU NEGATIVE 03/12/2013 0123   HGBUR NEGATIVE 03/12/2013 0123   HGBUR negative 11/15/2008 0927   BILIRUBINUR n 08/22/2016 1037   KETONESUR NEGATIVE 03/12/2013 0123   PROTEINUR n 08/22/2016 1037   PROTEINUR NEGATIVE 03/12/2013 0123   UROBILINOGEN 0.2 08/22/2016 1037   UROBILINOGEN 0.2 03/12/2013 0123   NITRITE n 08/22/2016 1037   NITRITE NEGATIVE 03/12/2013 0123   LEUKOCYTESUR Negative 08/22/2016 1037   Sepsis Labs Invalid input(s): PROCALCITONIN,  WBC,  LACTICIDVEN Microbiology No results found for this or any previous visit (from the past 240 hour(s)).   Time coordinating discharge: 31 minutes  SIGNED:   Rosita Fire, MD  [facility] 10/29/2016, 12:44 PM  If 7PM-7AM, please contact night-coverage [email or web address] Password [email or web address]

## 2016-10-31 ENCOUNTER — Telehealth: Payer: Self-pay | Admitting: *Deleted

## 2016-10-31 NOTE — Telephone Encounter (Signed)
D/C 10/29/16  DX CHF  Transition Care Management Follow-up Telephone Call  How have you been since you were released from the hospital? "I have been doing really well. The swelling has gone, breathing is fine, I am doing wonderful"     Do you understand why you were in the hospital? "Yes, my heart failure was acting up"    Do you understand the discharge instrcutions? "Yes, I was told about weighing myself daily, watching my sodium intake, and taking my medications."    Reviewed with patient on importance of daily weights, educated on contacting PCP/Cardiologist office if weight gain of 3lbs in one day or 5 lbs in one week. Instructed patient on keeping weight log/diary. Patient voiced understanding.   Items Reviewed:  Medications reviewed: Yes, patient was started on lasix, potassium, and coreg and patient d/c'd vitamin E; reviewed medication purposes, actions, and side effects. Understanding voiced.   Allergies reviewed: Yes, No changes  Dietary changes reviewed: Yes, reviewed 2g sodium heart healthy diet choices; patient voiced understanding   Referrals reviewed: n/a   Functional Questionnaire:   Activities of Daily Living (ADLs):   She states they are independent in the following: ambulation, bathing and hygiene, feeding, continence, grooming, toileting and dressing States they require assistance with the following: none   Any transportation issues/concerns?: No, patient able to drive; patient has sister who is able to help if needed.    Any patient concerns? No    Confirmed importance and date/time of follow-up visits scheduled: yes, patient to follow up with cardiologist on 11/07/16 at 5; PCP follow up is 11/07/16 at 1430; educated patient on importance of keeping appointment in efforts to prevent rehospitalization; patient also has follow up with pulmonologist on 12/02/16  Confirmed with patient if condition begins to worsen call PCP or go to the ER.  Patient was given  the Call-a-Nurse line [phone]: Yes, educated on s/s to report; understanding voiced.

## 2016-11-07 ENCOUNTER — Ambulatory Visit (INDEPENDENT_AMBULATORY_CARE_PROVIDER_SITE_OTHER): Payer: PPO | Admitting: Physician Assistant

## 2016-11-07 ENCOUNTER — Encounter: Payer: Self-pay | Admitting: Family Medicine

## 2016-11-07 ENCOUNTER — Encounter: Payer: Self-pay | Admitting: Physician Assistant

## 2016-11-07 ENCOUNTER — Ambulatory Visit (INDEPENDENT_AMBULATORY_CARE_PROVIDER_SITE_OTHER): Payer: PPO | Admitting: Family Medicine

## 2016-11-07 VITALS — BP 112/68 | Temp 98.7°F | Ht 62.0 in | Wt 172.0 lb

## 2016-11-07 VITALS — BP 110/76 | HR 84 | Ht 62.0 in | Wt 171.4 lb

## 2016-11-07 DIAGNOSIS — I5022 Chronic systolic (congestive) heart failure: Secondary | ICD-10-CM

## 2016-11-07 DIAGNOSIS — I5021 Acute systolic (congestive) heart failure: Secondary | ICD-10-CM

## 2016-11-07 DIAGNOSIS — I10 Essential (primary) hypertension: Secondary | ICD-10-CM | POA: Diagnosis not present

## 2016-11-07 DIAGNOSIS — I429 Cardiomyopathy, unspecified: Secondary | ICD-10-CM

## 2016-11-07 NOTE — Progress Notes (Signed)
   Subjective:    Patient ID: Tina May, female    DOB: [birth year], 71 y.o.   MRN: [id number]  HPI Here for a transitional care follow up for a hospital stay from 10-26-16 to 10-29-16 for an acute episode of CHF. She presented with SOB though no real peripheral edema and an ECHO revealed global hypokinesis with an EF of 25-30%. She was diuresed and her Diltiazem and Diovan were stopped, instead she was started on Coreg and Entresto. She has done well since then and today she feels fine. No SOB at all. Her BP has been stable. We reviewed all notes and test results together today.    Review of Systems  Constitutional: Negative.   Respiratory: Negative.   Cardiovascular: Negative.   Neurological: Negative.        Objective:   Physical Exam  Constitutional: She is oriented to person, place, and time. She appears well-developed and well-nourished.  Neck: No thyromegaly present.  Cardiovascular: Normal rate, regular rhythm, normal heart sounds and intact distal pulses.   Pulmonary/Chest: Effort normal and breath sounds normal.  Musculoskeletal: She exhibits no edema.  Lymphadenopathy:    She has no cervical adenopathy.  Neurological: She is alert and oriented to person, place, and time.          Assessment & Plan:  She is doing well after an episode of systolic CHF. She will stay on her current meds. She has restricted sodium in her diet. She is scheduled for another ECHO on 11-19-16 and she is set to see [name] on 02-23-17.  [name], MD

## 2016-11-07 NOTE — Patient Instructions (Signed)
Medication Instructions:  Your physician recommends that you continue on your current medications as directed. Please refer to the Current Medication list given to you today. If you need a refill on your cardiac medications before your next appointment, please call your pharmacy.  Labwork: BMP NEXT FRIDAY HERE IN OUR OFFICE AT [facility]  Testing/Procedures: Your physician has requested that you have an echocardiogram. Echocardiography is a painless test that uses sound waves to create images of your heart. It provides your doctor with information about the size and shape of your heart and how well your heart's chambers and valves are working. This procedure takes approximately one hour. There are no restrictions for this procedure.  Follow-Up: Your physician wants you to follow-up in: [place] Acie Fredrickson.  Special Instructions: PLEASE FOLLOW 2,000MG  LOW SODIUM DIET  Thank you for choosing [facility] at [employer]!!     2,000MG  LOW SODIUM DIET  DASH stands for "Dietary Approaches to Stop Hypertension." The DASH eating plan is a healthy eating plan that has been shown to reduce high blood pressure (hypertension). It may also reduce your risk for type 2 diabetes, heart disease, and stroke. The DASH eating plan may also help with weight loss. What are tips for following this plan? General guidelines  Avoid eating more than 2,300 mg (milligrams) of salt (sodium) a day. If you have hypertension, you may need to reduce your sodium intake to 1,500 mg a day.  Limit alcohol intake to no more than 1 drink a day for nonpregnant women and 2 drinks a day for men. One drink equals 12 oz of beer, 5 oz of wine, or 1 oz of hard liquor.  Work with your health care provider to maintain a healthy body weight or to lose weight. Ask what an ideal weight is for you.  Get at least 30 minutes of exercise that causes your heart to beat faster (aerobic exercise) most days of the week. Activities may  include walking, swimming, or biking.  Work with your health care provider or diet and nutrition specialist (dietitian) to adjust your eating plan to your individual calorie needs. Reading food labels  Check food labels for the amount of sodium per serving. Choose foods with less than 5 percent of the Daily Value of sodium. Generally, foods with less than 300 mg of sodium per serving fit into this eating plan.  To find whole grains, look for the word "whole" as the first word in the ingredient list. Shopping  Buy products labeled as "low-sodium" or "no salt added."  Buy fresh foods. Avoid canned foods and premade or frozen meals. Cooking  Avoid adding salt when cooking. Use salt-free seasonings or herbs instead of table salt or sea salt. Check with your health care provider or pharmacist before using salt substitutes.  Do not fry foods. Cook foods using healthy methods such as baking, boiling, grilling, and broiling instead.  Cook with heart-healthy oils, such as olive, canola, soybean, or sunflower oil. Meal planning   Eat a balanced diet that includes: ? 5 or more servings of fruits and vegetables each day. At each meal, try to fill half of your plate with fruits and vegetables. ? Up to 6-8 servings of whole grains each day. ? Less than 6 oz of lean meat, poultry, or fish each day. A 3-oz serving of meat is about the same size as a deck of cards. One egg equals 1 oz. ? 2 servings of low-fat dairy each day. ? A serving  of nuts, seeds, or beans 5 times each week. ? Heart-healthy fats. Healthy fats called Omega-3 fatty acids are found in foods such as flaxseeds and coldwater fish, like sardines, salmon, and mackerel.  Limit how much you eat of the following: ? Canned or prepackaged foods. ? Food that is high in trans fat, such as fried foods. ? Food that is high in saturated fat, such as fatty meat. ? Sweets, desserts, sugary drinks, and other foods with added sugar. ? Full-fat  dairy products.  Do not salt foods before eating.  Try to eat at least 2 vegetarian meals each week.  Eat more home-cooked food and less restaurant, buffet, and fast food.  When eating at a restaurant, ask that your food be prepared with less salt or no salt, if possible. What foods are recommended? The items listed may not be a complete list. Talk with your dietitian about what dietary choices are best for you. Grains Whole-grain or whole-wheat bread. Whole-grain or whole-wheat pasta. Brown rice. [place]ena Morrow. Bulgur. Whole-grain and low-sodium cereals. Pita bread. Low-fat, low-sodium crackers. Whole-wheat flour tortillas. Vegetables Fresh or frozen vegetables (raw, steamed, roasted, or grilled). Low-sodium or reduced-sodium tomato and vegetable juice. Low-sodium or reduced-sodium tomato sauce and tomato paste. Low-sodium or reduced-sodium canned vegetables. Fruits All fresh, dried, or frozen fruit. Canned fruit in natural juice (without added sugar). Meat and other protein foods Skinless chicken or [place]. Ground chicken or [place]. Pork with fat trimmed off. Fish and seafood. Egg whites. Dried beans, peas, or lentils. Unsalted nuts, nut butters, and seeds. Unsalted canned beans. Lean cuts of beef with fat trimmed off. Low-sodium, lean deli meat. Dairy Low-fat (1%) or fat-free (skim) milk. Fat-free, low-fat, or reduced-fat cheeses. Nonfat, low-sodium ricotta or cottage cheese. Low-fat or nonfat yogurt. Low-fat, low-sodium cheese. Fats and oils Soft margarine without trans fats. Vegetable oil. Low-fat, reduced-fat, or light mayonnaise and salad dressings (reduced-sodium). Canola, safflower, olive, soybean, and sunflower oils. Avocado. Seasoning and other foods Herbs. Spices. Seasoning mixes without salt. Unsalted popcorn and pretzels. Fat-free sweets. What foods are not recommended? The items listed may not be a complete list. Talk with your dietitian about what dietary choices are best  for you. Grains Baked goods made with fat, such as croissants, muffins, or some breads. Dry pasta or rice meal packs. Vegetables Creamed or fried vegetables. Vegetables in a cheese sauce. Regular canned vegetables (not low-sodium or reduced-sodium). Regular canned tomato sauce and paste (not low-sodium or reduced-sodium). Regular tomato and vegetable juice (not low-sodium or reduced-sodium). Angie Fava. Olives. Fruits Canned fruit in a light or heavy syrup. Fried fruit. Fruit in cream or butter sauce. Meat and other protein foods Fatty cuts of meat. Ribs. Fried meat. Berniece Salines. Sausage. Bologna and other processed lunch meats. Salami. Fatback. Hotdogs. Bratwurst. Salted nuts and seeds. Canned beans with added salt. Canned or smoked fish. Whole eggs or egg yolks. Chicken or [place] with skin. Dairy Whole or 2% milk, cream, and half-and-half. Whole or full-fat cream cheese. Whole-fat or sweetened yogurt. Full-fat cheese. Nondairy creamers. Whipped toppings. Processed cheese and cheese spreads. Fats and oils Butter. Stick margarine. Lard. Shortening. Ghee. Bacon fat. Tropical oils, such as coconut, palm kernel, or palm oil. Seasoning and other foods Salted popcorn and pretzels. Onion salt, garlic salt, seasoned salt, table salt, and sea salt. Worcestershire sauce. Tartar sauce. Barbecue sauce. Teriyaki sauce. Soy sauce, including reduced-sodium. Steak sauce. Canned and packaged gravies. Fish sauce. Oyster sauce. Cocktail sauce. Horseradish that you find on the shelf. Ketchup. Mustard. Meat  flavorings and tenderizers. Bouillon cubes. Hot sauce and Tabasco sauce. Premade or packaged marinades. Premade or packaged taco seasonings. Relishes. Regular salad dressings. Where to find more information:  National Heart, Lung, and [employer]: https://wilson-eaton.com/  American Heart Association: www.heart.org Summary  The DASH eating plan is a healthy eating plan that has been shown to reduce high blood pressure  (hypertension). It may also reduce your risk for type 2 diabetes, heart disease, and stroke.  With the DASH eating plan, you should limit salt (sodium) intake to 2,300 mg a day. If you have hypertension, you may need to reduce your sodium intake to 1,500 mg a day.  When on the DASH eating plan, aim to eat more fresh fruits and vegetables, whole grains, lean proteins, low-fat dairy, and heart-healthy fats.  Work with your health care provider or diet and nutrition specialist (dietitian) to adjust your eating plan to your individual calorie needs. This information is not intended to replace advice given to you by your health care provider. Make sure you discuss any questions you have with your health care provider. Document Released: 04/10/2011 Document Revised: 04/14/2016 Document Reviewed: 04/14/2016 Elsevier Interactive Patient Education  2017 [employer].

## 2016-11-07 NOTE — Progress Notes (Signed)
Cardiology Office Note   Date:  11/07/2016   ID:  Tina, May June 27, 1945, MRN [id number]  PCP:  [name], MD  Cardiologist:  Dr Acie Fredrickson - saw in-hospital Barrett, Tina May   Chief Complaint  Patient presents with  . Follow-up    History of Present Illness: Tina May is a 71 y.o. female with a history of   Tina May presents for cardiology follow up.  Her breathing is better. She is working hard on a low Na diet. She wants to eat out more, is interested in low Na restaurant options.   Her activity level has been increasing, she is not having problems with this. Since she has been watching her sodium, she has not had any lower extremity edema, denies orthopnea and PND.   She has been playing 18 holes of golf on a regular basis. Of note, her activity level is not aware was as she was normally able to do work around the house or yard in the mornings, than go play golf and come home and do other things. Now, she is still going to play golf, but is coming home to rest afterwards and feels this is better for her.  Regardless of whether she is active or not, she has not had any chest pain. She has not had palpitations. She has not had presyncope or syncope.  I discussed the stressors of her husband's recent death as a possible cause of her left ventricular dysfunction. She admits that there was a lot going on during that time. However, she has always felt like she dealt with it very well because the time he was sick gave her time to prepare for his death. She had been on several trips after he died and had been extremely busy. After she came back from [place] was when she first began noticing symptoms. The time frame from symptom on set to hospitalization was fairly sudden, no more than a week.   Past Medical History:  Diagnosis Date  . Allergy   . Arthritis   . Asthma   . 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 Cataract    starting  . Diverticulosis   .  External hemorrhoids   . Hepatic cyst   . Hyperlipidemia   . Hypertension   . Pancreatic lesion 03/11/13    Past Surgical History:  Procedure Laterality Date  . ABDOMINAL HYSTERECTOMY    . COLONOSCOPY  08-06-07   per Dr. [name], diverticulae only, repeat in 10 yrs   . SMALL INTESTINE SURGERY     from small intestine    Current Outpatient Prescriptions  Medication Sig Dispense Refill  . albuterol (PROVENTIL HFA;VENTOLIN HFA) 108 (90 Base) MCG/ACT inhaler Inhale 2 puffs into the lungs every 4 (four) hours as needed for wheezing or shortness of breath. 3 Inhaler 3  . aspirin 81 MG tablet Take 81 mg by mouth daily.      . carvedilol (COREG) 6.25 MG tablet Take 1 tablet (6.25 mg total) by mouth 2 (two) times daily with a meal. 60 tablet 0  . estradiol (ESTRACE) 0.5 MG tablet Take 0.5 mg by mouth once a week.    . furosemide (LASIX) 40 MG tablet Take 1 tablet (40 mg total) by mouth daily. 30 tablet 0  . Hyaluronic Acid-Vitamin C (HYALURONIC ACID PO) Take 1 tablet by mouth daily.    . meloxicam (MOBIC) 15 MG tablet Take 1 tablet (15 mg total)  by mouth daily. 90 tablet 3  . Multiple Vitamin (MULTIVITAMIN) tablet Take 1 tablet by mouth daily.      . potassium chloride SA (K-DUR,KLOR-CON) 20 MEQ tablet Take 1 tablet (20 mEq total) by mouth daily. 30 tablet 0  . RESTASIS 0.05 % ophthalmic emulsion Place 1 drop into both eyes 2 (two) times daily.     . rosuvastatin (CRESTOR) 5 MG tablet Take 1 tablet (5 mg total) by mouth daily. 90 tablet 3  . sacubitril-valsartan (ENTRESTO) 24-26 MG Take 1 tablet by mouth 2 (two) times daily. 60 tablet 0  . Turmeric Curcumin 500 MG CAPS Take 1,000 mg by mouth daily.     No current facility-administered medications for this visit.     Allergies:   Atorvastatin and Morphine and related    Social History:  The patient  reports that she has quit smoking. She has never used smokeless tobacco. She reports that she drinks about 4.2 oz of alcohol per week . She  reports that she does not use drugs.   Family History:  The patient's family history includes Healthy in her brother and sister.    ROS:  Please see the history of present illness. All other systems are reviewed and negative.    PHYSICAL EXAM: VS:  BP 110/76   Pulse 84   Ht 5\' 2"  (1.575 m)   Wt 171 lb 6.4 oz (77.7 kg)   BMI 31.35 kg/m  , BMI Body mass index is 31.35 kg/m. GEN: Well nourished, well developed, female in no acute distress  HEENT: normal for age  Neck: no JVD, no carotid bruit, no masses Cardiac: RRR; no murmur, no rubs, or gallops Respiratory:  clear to auscultation bilaterally, normal work of breathing GI: soft, nontender, nondistended, + BS MS: no deformity or atrophy; no edema; distal pulses are 2+ in all 4 extremities   Skin: warm and dry, no rash Neuro:  Strength and sensation are intact Psych: euthymic mood, full affect   EKG:  EKG is not ordered today.  Recent Labs: 05/09/2016: ALT 24 10/26/2016: B Natriuretic Peptide 483.2; TSH 1.050 10/27/2016: Hemoglobin 13.7; Platelets 272 10/29/2016: BUN 20; Creatinine, Ser 0.72; Magnesium 2.0; Potassium 4.4; Sodium 140    Lipid Panel    Component Value Date/Time   CHOL 210 (H) 08/22/2016 0942   TRIG 149.0 08/22/2016 0942   HDL 67.90 08/22/2016 0942   CHOLHDL 3 08/22/2016 0942   VLDL 29.8 08/22/2016 0942   LDLCALC 112 (H) 08/22/2016 0942   LDLDIRECT 137.6 11/05/2010 0935     Wt Readings from Last 3 Encounters:  11/07/16 172 lb (78 kg)  11/07/16 171 lb 6.4 oz (77.7 kg)  10/29/16 174 lb 13.2 oz (79.3 kg)     Other studies Reviewed: Additional studies/ records that were reviewed today include: hospital records and testing.  ASSESSMENT AND PLAN:  1.  Chronic systolic CHF: Her weight is trending down, she is eating much healthier. She has having no signs or symptoms of volume depletion. Continue current medications at the current doses. We will check a BMET next week to make sure she is tolerating the  current dose of Lasix and potassium well.  2. Hypertension: Her blood pressure is well controlled on current medical therapy. Do not believe she would tolerate an increase in her interest oh. Continue to follow.  3. Cardiomyopathy: She has not had an ischemic evaluation. It is unclear if this is ischemic or nonischemic. Because of recent life events, she is at high  risk to have stress-induced cardiomyopathy from the death of her husband. If her EF does not improve on medical therapy, she will need an ischemic eval. She does not wish to pursue it for now.   Current medicines are reviewed at length with the patient today.  The patient does not have concerns regarding medicines.  The following changes have been made:  no change  Labs/ tests ordered today include:   Orders Placed This Encounter  Procedures  . Basic metabolic panel  . ECHOCARDIOGRAM COMPLETE     Disposition:   FU with [name]  Signed, [name], PA-C  11/07/2016 5:42 PM    [facility] Phone: [phone]; Fax: [phone]  This note was written with the assistance of speech recognition software. Please excuse any transcriptional errors.

## 2016-11-07 NOTE — Patient Instructions (Signed)
WE NOW OFFER   [facility] FAST TRACK!!!  SAME DAY Appointments for ACUTE CARE  Such as: Sprains, Injuries, cuts, abrasions, rashes, muscle pain, joint pain, back pain Colds, flu, sore throats, headache, allergies, cough, fever  Ear pain, sinus and eye infections Abdominal pain, nausea, vomiting, diarrhea, upset stomach Animal/insect bites  3 Easy Ways to Schedule: Walk-In Scheduling Call in scheduling Mychart Sign-up: [email or web address]         

## 2016-11-10 DIAGNOSIS — I5021 Acute systolic (congestive) heart failure: Secondary | ICD-10-CM | POA: Insufficient documentation

## 2016-11-17 DIAGNOSIS — Z79899 Other long term (current) drug therapy: Secondary | ICD-10-CM | POA: Diagnosis not present

## 2016-11-18 LAB — BASIC METABOLIC PANEL
BUN / CREAT RATIO: 22 (ref 12–28)
BUN: 20 mg/dL (ref 8–27)
CHLORIDE: 102 mmol/L (ref 96–106)
CO2: 24 mmol/L (ref 20–29)
Calcium: 9.4 mg/dL (ref 8.7–10.3)
Creatinine, Ser: 0.92 mg/dL (ref 0.57–1.00)
GFR calc Af Amer: 72 mL/min/{1.73_m2} (ref >59)
GFR calc non Af Amer: 63 mL/min/{1.73_m2} (ref >59)
GLUCOSE: 96 mg/dL (ref 65–99)
Potassium: 5 mmol/L (ref 3.5–5.2)
SODIUM: 145 mmol/L — AB (ref 134–144)

## 2016-11-19 ENCOUNTER — Other Ambulatory Visit: Payer: Self-pay

## 2016-11-19 ENCOUNTER — Ambulatory Visit (HOSPITAL_COMMUNITY): Payer: PPO | Attending: Cardiology

## 2016-11-19 ENCOUNTER — Telehealth: Payer: Self-pay | Admitting: *Deleted

## 2016-11-19 DIAGNOSIS — Z79899 Other long term (current) drug therapy: Secondary | ICD-10-CM

## 2016-11-19 DIAGNOSIS — I429 Cardiomyopathy, unspecified: Secondary | ICD-10-CM | POA: Insufficient documentation

## 2016-11-19 DIAGNOSIS — I083 Combined rheumatic disorders of mitral, aortic and tricuspid valves: Secondary | ICD-10-CM | POA: Diagnosis not present

## 2016-11-19 DIAGNOSIS — R7989 Other specified abnormal findings of blood chemistry: Secondary | ICD-10-CM

## 2016-11-19 DIAGNOSIS — I313 Pericardial effusion (noninflammatory): Secondary | ICD-10-CM | POA: Diagnosis not present

## 2016-11-19 NOTE — Telephone Encounter (Signed)
-----   Message from [place], [place] sent at 11/18/2016  8:24 AM EDT ----- Renal function worsened slightly from previous lab, recommend recheck BMET in 3 weeks, if Cr continue to trend up, will need decrease lasix

## 2016-11-19 NOTE — Telephone Encounter (Signed)
Results and recommendations discussed with patient, who verbalized understanding and thanks.  

## 2016-11-20 ENCOUNTER — Telehealth: Payer: Self-pay | Admitting: Physician Assistant

## 2016-11-20 NOTE — Telephone Encounter (Signed)
New message    Pt is calling stating she is returning call from RN.

## 2016-11-20 NOTE — Progress Notes (Signed)
Pumping function improved compare to last echo, but has not normalized. She needs an earlier return visit with Suanne Marker or [name] in 2-3 weeks to review echo and discuss next step of workup.

## 2016-11-20 NOTE — Telephone Encounter (Signed)
Spoke to patient, aware of recommendations. I've scheduled f/u for her on 12/10/16 at 9:30 -- patient aware of appt time and location. Advised to call if further needs in interim, pt verbalized understanding and thanks.

## 2016-11-28 ENCOUNTER — Telehealth: Payer: Self-pay | Admitting: Physician Assistant

## 2016-11-28 ENCOUNTER — Other Ambulatory Visit: Payer: Self-pay | Admitting: Physician Assistant

## 2016-11-28 ENCOUNTER — Other Ambulatory Visit: Payer: Self-pay | Admitting: Family Medicine

## 2016-11-28 MED ORDER — SACUBITRIL-VALSARTAN 24-26 MG PO TABS: 1.0000 | ORAL_TABLET | Freq: Two times a day (BID) | ORAL | 3 refills | Status: DC

## 2016-11-28 MED ORDER — FUROSEMIDE 40 MG PO TABS: 40.0000 mg | ORAL_TABLET | Freq: Every day | ORAL | 6 refills | Status: DC

## 2016-11-28 MED ORDER — POTASSIUM CHLORIDE CRYS ER 20 MEQ PO TBCR: 20.0000 meq | EXTENDED_RELEASE_TABLET | Freq: Every day | ORAL | 6 refills | Status: DC

## 2016-11-28 MED ORDER — POTASSIUM CHLORIDE CRYS ER 10 MEQ PO TBCR: 10.0000 meq | EXTENDED_RELEASE_TABLET | Freq: Every day | ORAL | 6 refills | Status: DC

## 2016-11-28 MED ORDER — CARVEDILOL 6.25 MG PO TABS: 6.2500 mg | ORAL_TABLET | Freq: Two times a day (BID) | ORAL | 6 refills | Status: DC

## 2016-11-28 MED ORDER — FUROSEMIDE 40 MG PO TABS
40.0000 mg | ORAL_TABLET | Freq: Every day | ORAL | 6 refills | Status: DC
Start: 2016-11-28 — End: 2017-07-13

## 2016-11-28 NOTE — Telephone Encounter (Signed)
Pt needs refills on her CHF meds, Coreg, Lasix and Kdur plus Entresto Decreased the dose of Kdur to 10 meq qd since last K+ was 5.0 [facility] [place], all [facility] [place].   Called pharmacist to confirm.  [name] 11/28/2016 5:12 PM Bee[place] [phone]

## 2016-11-28 NOTE — Telephone Encounter (Signed)
Can we refill this? 

## 2016-12-02 ENCOUNTER — Encounter: Payer: Self-pay | Admitting: Internal Medicine

## 2016-12-02 ENCOUNTER — Other Ambulatory Visit: Payer: Self-pay | Admitting: Cardiovascular Disease

## 2016-12-02 ENCOUNTER — Ambulatory Visit (INDEPENDENT_AMBULATORY_CARE_PROVIDER_SITE_OTHER): Payer: PPO | Admitting: Internal Medicine

## 2016-12-02 ENCOUNTER — Other Ambulatory Visit (INDEPENDENT_AMBULATORY_CARE_PROVIDER_SITE_OTHER): Payer: PPO

## 2016-12-02 VITALS — BP 124/80 | HR 81 | Ht 62.0 in | Wt 171.0 lb

## 2016-12-02 DIAGNOSIS — J45991 Cough variant asthma: Secondary | ICD-10-CM

## 2016-12-02 DIAGNOSIS — J90 Pleural effusion, not elsewhere classified: Secondary | ICD-10-CM

## 2016-12-02 LAB — CBC WITH DIFFERENTIAL/PLATELET
BASOS PCT: 1.1 % (ref 0.0–3.0)
Basophils Absolute: 0.1 10*3/uL (ref 0.0–0.1)
EOS PCT: 4.3 % (ref 0.0–5.0)
Eosinophils Absolute: 0.3 10*3/uL (ref 0.0–0.7)
HCT: 47.6 % — ABNORMAL HIGH (ref 36.0–46.0)
Hemoglobin: 15.5 g/dL — ABNORMAL HIGH (ref 12.0–15.0)
LYMPHS ABS: 1.7 10*3/uL (ref 0.7–4.0)
Lymphocytes Relative: 26.5 % (ref 12.0–46.0)
MCHC: 32.5 g/dL (ref 30.0–36.0)
MCV: 96.5 fl (ref 78.0–100.0)
MONOS PCT: 10.6 % (ref 3.0–12.0)
Monocytes Absolute: 0.7 10*3/uL (ref 0.1–1.0)
Neutro Abs: 3.6 10*3/uL (ref 1.4–7.7)
Neutrophils Relative %: 57.5 % (ref 43.0–77.0)
Platelets: 239 10*3/uL (ref 150.0–400.0)
RBC: 4.93 Mil/uL (ref 3.87–5.11)
RDW: 13.6 % (ref 11.5–15.5)
WBC: 6.3 10*3/uL (ref 4.0–10.5)

## 2016-12-02 LAB — NITRIC OXIDE: NITRIC OXIDE: 23

## 2016-12-02 NOTE — Telephone Encounter (Signed)
Tina May with [employer]eds calling in regards to a pre-authorization for patient. Reference # [facility]

## 2016-12-02 NOTE — Progress Notes (Signed)
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 Pulmonary Consult    Referred by Dr. [name]. Pt states dxed with Asthma 25 yrs ago. She states mold and mildew make her symptoms flare. She states her breathing is doing well today.  She rarely uses her rescue inhaler.    doing well now x for tendency to pnds/ hoarseness x 25 y  On entresto for chf with new R pleural effusion 10/26/16  being followed by cards No need for saba/ has hfa /neb never uses latter   No obvious day to day or daytime variability or assoc excess/ purulent sputum or mucus plugs or hemoptysis or cp or chest tightness, subjective wheeze or overt sinus or hb symptoms. No unusual exp hx or h/o childhood pna/ asthma or knowledge of premature birth.  Sleeping ok without nocturnal  or early am exacerbation  of respiratory  c/o's or need for noct saba. Also denies any obvious fluctuation of symptoms with weather or environmental changes or other aggravating or alleviating factors except as outlined above   Current Medications, Allergies, Complete Past Medical History, Past Surgical History, Family History, and Social History were reviewed in [employer] record.  ROS  The following are not active complaints unless bolded sore  throat, dysphagia, dental problems, itching, sneezing,  nasal congestion or excess/ purulent secretions, ear ache,   fever, chills, sweats, unintended wt loss, classically pleuritic or exertional cp,  orthopnea pnd or leg swelling improved, presyncope, palpitations, abdominal pain, anorexia, nausea, vomiting, diarrhea  or change in bowel or bladder habits, change in stools or urine, dysuria,hematuria,  rash, arthralgias, visual complaints, headache, numbness, weakness or ataxia or problems with walking or coordination,  change in mood/affect or memory.           Review of Systems     Objective:   Physical Exam    amb wf nad  Wt Readings from Last 3 Encounters:  12/02/16 171 lb (77.6 kg)  11/07/16 172 lb (78 kg)  11/07/16 171 lb 6.4 oz (77.7 kg)    Vital signs reviewed  - Note on arrival 02 sats  94% on RA     HEENT: nl dentition, turbinates bilaterally, and oropharynx. Nl external ear canals without cough reflex   NECK :  without JVD/Nodes/TM/ nl carotid upstrokes bilaterally   LUNGS: no acc muscle use,  Nl contour chest with slightly diminished bs bilaterally but no wheeze     CV:  RRR  no s3 or murmur or increase in P2, and no edema   ABD:  soft and nontender with nl inspiratory excursion in the supine position. No bruits or organomegaly appreciated, bowel sounds nl  MS:  Nl gait/ ext warm without deformities, calf tenderness, cyanosis or clubbing No obvious joint restrictions   SKIN: warm and dry without lesions    NEURO:  alert, approp, nl sensorium with  no motor or cerebellar deficits apparent.      I personally reviewed images and agree with radiology impression as follows:   Chest CT a 10/26/16  1. No pulmonary emboli. 2. New cardiomegaly with moderate right pleural effusion and small pericardial effusion. Tiny left effusion. 3. Mild emphysematous changes.   Assessment:

## 2016-12-02 NOTE — Telephone Encounter (Signed)
New Message:    Pt says she need prior authorization for her Entresto please.This message was per [name].

## 2016-12-02 NOTE — Assessment & Plan Note (Addendum)
12/02/2016  After extensive coaching HFA effectiveness =   75% from a baseline of 50%  FENO 12/02/2016  =   23  Allergy profile 12/02/2016 >  Eos 0.3 /  IgE  pending - Spirometry 12/02/2016  FEV1 1.43 (70%)  Ratio 67  With classic curvature prior to saba   She needs pre and post saba spirometry to sort out whether this is asthma vs copd with asthmatic component as she is a former smoker but the bottomline is that it doesn't really matter as long as symptoms well controlled s tendency or flare or use saba > the rule of 2's for asthma/ explained to the pt in detail.  Also note that BB and entresto can mimic asthma /copd flare going forward need to keep that in mind should her symptoms recur in absence of obvious trigger (mold is the only one identified so far and allergy profile was sent to complete the preliminary w/u)   Total time devoted to counseling  > 50 % of initial 60 min office visit:  review case with pt/ discussion of options/alternatives/ personally creating written customized instructions  in presence of pt  then going over those specific  Instructions directly with the pt including how to use all of the meds but in particular covering each new medication in detail and the difference between the maintenance= "automatic" meds and the prns using an action plan format for the latter (If this problem/symptom => do that organization reading Left to right).  Please see AVS from this visit for a full list of these instructions which I personally wrote for this pt and  are unique to this visit.

## 2016-12-02 NOTE — Patient Instructions (Addendum)
Please remember to go to the lab   department downstairs in the basement  for your tests - we will call you with the results when they are available.    For itching, sneezing, runny nose >> clariton / allegra or zyrtec as needed (without the d)  For breathing >> Only use your albuterol as a rescue medication to be used if you can't catch your breath by resting or doing a relaxed purse lip breathing pattern.  - The less you use it, the better it will work when you need it. - Ok to use up to 2 puffs  every 4 hours if you must but call for immediate appointment if use goes up over your usual need - Don't leave home without it !!  (think of it like the spare tire for your car)     If you are satisfied with your treatment plan,  let your doctor know and he/she can either refill your medications or you can return here when your prescription runs out.     If in any way you are not 100% satisfied,  please tell us.  If 100% better, tell your friends!  Pulmonary follow up is as needed

## 2016-12-02 NOTE — Telephone Encounter (Signed)
I called Alex at [employer] back. Because I was having difficulties yesterday attempting to do a PA for the pts Entresto through [employer] called to offer his assistance. I thanked him and advised him that I spoke with someone at [employer] yesterday and that they faxed me a hard copy of the PA application. He verbalized understanding and advised me that he would document that I sent a hard copy to [facility] and that [employer] will check back with [employer] in a couple days to check progress of the PA.  I called the pt back and advised her that I have done the PA on her Delene Loll and that we are now awaiting a response from [place]. She verbalized understanding and thanked me for my help.

## 2016-12-03 DIAGNOSIS — J90 Pleural effusion, not elsewhere classified: Secondary | ICD-10-CM | POA: Insufficient documentation

## 2016-12-03 LAB — RESPIRATORY ALLERGY PROFILE REGION II ~~LOC~~
Allergen, A. alternata, m6: 0.26 kU/L — ABNORMAL HIGH
Allergen, C. Herbarum, M2: <0.1 kU/L
Allergen, D pternoyssinus,d7: <0.1 kU/L
Allergen, Mulberry, t76: <0.1 kU/L
Allergen, P. notatum, m1: <0.1 kU/L
Aspergillus fumigatus, m3: <0.1 kU/L
Box Elder IgE: <0.1 kU/L
Cockroach: <0.1 kU/L
Common Ragweed: <0.1 kU/L
D. farinae: <0.1 kU/L
Dog Dander: <0.1 kU/L
Elm IgE: <0.1 kU/L
IGE (IMMUNOGLOBULIN E), SERUM: 194 kU/L — AB (ref <115)
Johnson Grass: <0.1 kU/L
Rough Pigweed  IgE: <0.1 kU/L
Sheep Sorrel IgE: <0.1 kU/L
TIMOTHY GRASS: 0.37 kU/L — AB

## 2016-12-03 NOTE — Assessment & Plan Note (Signed)
New dx 10/26/16 in setting of decreased EF f/u by cards   Note that pleural effusion and copd/chronic asthma  have the same effect on insp muscles/mechanics (both shorten their length prior to inspiration making them weaker with less force reserve) so they are synergistic in causing sob.   She states this problem is being followed by cards and improving on entresto thought she may not tolerate it based on chronic sense of pnds which could worsen on it and notified to be on the lookout for this prior to adding more meds to counter it may want a trial off.  No dedicated pulmonary f/u planned for this problem unless requested

## 2016-12-03 NOTE — Progress Notes (Signed)
LMTCB

## 2016-12-04 ENCOUNTER — Telehealth: Payer: Self-pay | Admitting: Internal Medicine

## 2016-12-04 NOTE — Telephone Encounter (Signed)
Received fax from [employer]	[place] regarding prior authorization for ENTRESTO, this medication was APPROVED through 05/04/2017

## 2016-12-04 NOTE — Telephone Encounter (Signed)
Notes recorded by Tanda Rockers, MD on 12/03/2016 at 1:34 PM EDT Call patient : Studies are pos allergies to most and grass -------------------------- Spoke with pt. She is aware of results. Nothing further was needed.

## 2016-12-04 NOTE — Telephone Encounter (Signed)
Received a call from [place] from [place]. She had additional questions concerning the pts Entresto PA. All questions answered and per Levada Dy we will receive a decision within a couple via fax.

## 2016-12-10 ENCOUNTER — Telehealth: Payer: Self-pay

## 2016-12-10 ENCOUNTER — Encounter: Payer: Self-pay | Admitting: Physician Assistant

## 2016-12-10 ENCOUNTER — Ambulatory Visit (INDEPENDENT_AMBULATORY_CARE_PROVIDER_SITE_OTHER): Payer: PPO | Admitting: Physician Assistant

## 2016-12-10 ENCOUNTER — Other Ambulatory Visit: Payer: Self-pay | Admitting: Physician Assistant

## 2016-12-10 VITALS — BP 118/76 | HR 82 | Ht 62.0 in | Wt 171.6 lb

## 2016-12-10 DIAGNOSIS — I5022 Chronic systolic (congestive) heart failure: Secondary | ICD-10-CM

## 2016-12-10 DIAGNOSIS — Z79899 Other long term (current) drug therapy: Secondary | ICD-10-CM | POA: Diagnosis not present

## 2016-12-10 DIAGNOSIS — I429 Cardiomyopathy, unspecified: Secondary | ICD-10-CM

## 2016-12-10 DIAGNOSIS — I10 Essential (primary) hypertension: Secondary | ICD-10-CM

## 2016-12-10 DIAGNOSIS — K862 Cyst of pancreas: Secondary | ICD-10-CM | POA: Diagnosis not present

## 2016-12-10 DIAGNOSIS — R7989 Other specified abnormal findings of blood chemistry: Secondary | ICD-10-CM | POA: Diagnosis not present

## 2016-12-10 LAB — BASIC METABOLIC PANEL
BUN/Creatinine Ratio: 31 — ABNORMAL HIGH (ref 12–28)
BUN: 22 mg/dL (ref 8–27)
CALCIUM: 9.5 mg/dL (ref 8.7–10.3)
CHLORIDE: 98 mmol/L (ref 96–106)
CO2: 26 mmol/L (ref 20–29)
Creatinine, Ser: 0.71 mg/dL (ref 0.57–1.00)
GFR calc Af Amer: 99 mL/min/{1.73_m2} (ref >59)
GFR calc non Af Amer: 86 mL/min/{1.73_m2} (ref >59)
GLUCOSE: 104 mg/dL — AB (ref 65–99)
POTASSIUM: 4.6 mmol/L (ref 3.5–5.2)
Sodium: 142 mmol/L (ref 134–144)

## 2016-12-10 NOTE — Patient Instructions (Addendum)
Medication Instructions:  Your physician recommends that you continue on your current medications as directed. Please refer to the Current Medication list given to you today. If you need a refill on your cardiac medications before your next appointment, please call your pharmacy.  Labwork: BMET TODAY HERE IN OUR OFFICE AT [facility]  Testing: Your physician has requested that you have an stress echocardiogram. Echocardiography is a painless test that uses sound waves to create images of your heart. It provides your doctor with information about the size and shape of your heart and how well your heart's chambers and valves are working. This procedure takes approximately one hour. There are no restrictions for this procedure.  Your physician has requested that you have an CT OF THE ABDOMEN WITHOUT CONTRAST-[facility] [place] (MAP PROVIDED)  Follow-Up: Your physician wants you to follow-up in:3 MONTHS WITH DR Acie Fredrickson.    Thank you for choosing [facility] at [facility]!!

## 2016-12-10 NOTE — Telephone Encounter (Addendum)
**Note De-Identified Thaddeus Evitts Obfuscation** I received a call from [place] who is working with [name], PA-c at the [place] office today. Sharyn Lull states that the pt is seeing Suanne Marker today and that they have called the pts insurance company and asked if a tier exception can be done on the pts Entresto as it is costing her $85 for a 30 day supply. Per Sharyn Lull the [employer] advised her that we can.  I called [employer] (number listed on the pts PA approval letter) and s/w Zenia Resides.  Per Zenia Resides we can attempt a tier exception which is unlikely as Delene Loll is a brand name medication and there is no lower tier drug that is approved for treating the same DX.  I have asked Zenia Resides to fax me an Delene Loll tier exception form as I have tired to do a tier exception through [employer] without success (I keep getting a message that the pts PA has been approved even though Im filling out the form for tiering exception).  Zenia Resides states that he is faxing the form now.  Also, Zenia Resides advised me that if the tier exception is not approved that the pt can apply for Low Income Subsidy through the [facility] office or web site.  He states that this program helps by lowering the pts co-pay.   Awaiting tier exception form from [employer] Aris Even fax.

## 2016-12-10 NOTE — Progress Notes (Signed)
Cardiology Office Note   Date:  12/12/2016   ID:  Tina, May 08-Aug-1945, MRN [id number]  PCP:  [name], MD  Cardiologist:  [name], saw in-hospital 10/2016  [name] 11/07/2016  Chief Complaint  Patient presents with  . Follow-up    History of Present Illness: Tina May is a 71 y.o. female with a history of asthma (mold is a trigger), cardiomyopathy (no ischemic testing yet), chronic systolic CHF with an EF of 30-35 percent on 11/19/2016, HTN, HLD, pancreatic lesions seen in 2014  11/07/2016 office visit, patient doing well on Entresto 12/02/2016 office visit with [name], he noted that pleural effusion and COPD/chronic asthma have the same effect on respiratory mechanics, are synergistic in causing shortness of breath  AYLLA May presents for cardiology follow up  She is doing extremely well. She is very active. She works around her house and barn, has no CP or SOB. She does not have LE edema. She is compliant with a low Na diet. She is not having orthopnea or PND.   Her asthma bothers her a little bit, but not that bad. She gets a little hoarse at times or will need her inhaler. Not often.  She has not felt this well in years. She has never had chest pain and does not feel limited by her heart at all.  She is terrified of IV dye. She had a friend that died after getting it. If she has to have a cath, she will do it, but prefers some type of stress testing first>>cath if abnormal. She feels she could easily walk a treadmill, willing to do this.  She does not remember being told about a pancreatic lesion (seen on CT performed during an ER visit 2014 for for abd pain, GI illness). It has not been followed since then.   Past Medical History:  Diagnosis Date  . Allergy   . Arthritis   . Asthma   . 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 Cataract    starting  . Diverticulosis   . External hemorrhoids   . Hepatic cyst   . Hyperlipidemia    . Hypertension   . Pancreatic lesion 03/11/13    Past Surgical History:  Procedure Laterality Date  . ABDOMINAL HYSTERECTOMY    . COLONOSCOPY  08-06-07   per Dr. [name], diverticulae only, repeat in 10 yrs   . SMALL INTESTINE SURGERY     from small intestine    Current Outpatient Prescriptions  Medication Sig Dispense Refill  . albuterol (PROVENTIL HFA;VENTOLIN HFA) 108 (90 Base) MCG/ACT inhaler Inhale 2 puffs into the lungs every 4 (four) hours as needed for wheezing or shortness of breath. 3 Inhaler 3  . aspirin 81 MG tablet Take 81 mg by mouth daily.      . carvedilol (COREG) 6.25 MG tablet TAKE 1 TABLET BY MOUTH TWICE A DAY WITH A MEAL 60 tablet 11  . ENTRESTO 24-26 MG Take 1 tablet by mouth 2 (two) times daily.  0  . estradiol (ESTRACE) 0.5 MG tablet Take 0.5 mg by mouth once a week.    . furosemide (LASIX) 40 MG tablet Take 1 tablet (40 mg total) by mouth daily. 32 tablet 6  . Hyaluronic Acid-Vitamin C (HYALURONIC ACID PO) Take 1 tablet by mouth daily.    . meloxicam (MOBIC) 15 MG tablet Take 1 tablet (15 mg total) by mouth daily. 90 tablet 3  .  Multiple Vitamin (MULTIVITAMIN) tablet Take 1 tablet by mouth daily.      . potassium chloride SA (K-DUR,KLOR-CON) 10 MEQ tablet Take 1 tablet (10 mEq total) by mouth daily. 30 tablet 6  . RESTASIS 0.05 % ophthalmic emulsion Place 1 drop into both eyes 2 (two) times daily.     . rosuvastatin (CRESTOR) 5 MG tablet Take 5 mg by mouth daily.    . Turmeric Curcumin 500 MG CAPS Take 1,000 mg by mouth daily.     No current facility-administered medications for this visit.     Allergies:   Atorvastatin; Diclofenac; and Morphine and related    Social History:  The patient  reports that she quit smoking about 25 years ago. Her smoking use included Cigarettes. She has a 20.00 pack-year smoking history. She has never used smokeless tobacco. She reports that she drinks about 4.2 oz of alcohol per week . She reports that she does not use drugs.    Family History:  The patient's family history includes Healthy in her brother and sister; Hyperlipidemia in her unknown relative; Hypertension in her unknown relative; Prostate cancer in her unknown relative.    ROS:  Please see the history of present illness. All other systems are reviewed and negative.    PHYSICAL EXAM: VS:  BP 118/76   Pulse 82   Ht 5\' 2"  (1.575 m)   Wt 171 lb 9.6 oz (77.8 kg)   SpO2 98%   BMI 31.39 kg/m  , BMI Body mass index is 31.39 kg/m. GEN: Well nourished, well developed, female in no acute distress  HEENT: normal for age  Neck: no JVD, no carotid bruit, no masses Cardiac: RRR; no murmur, no rubs, or gallops Respiratory:  clear to auscultation bilaterally, normal work of breathing GI: soft, nontender, nondistended, + BS MS: no deformity or atrophy; no edema; distal pulses are 2+ in all 4 extremities   Skin: warm and dry, no rash Neuro:  Strength and sensation are intact Psych: euthymic mood, full affect   EKG:  EKG is not ordered today.  Recent Labs: 05/09/2016: ALT 24 10/26/2016: B Natriuretic Peptide 483.2; TSH 1.050 10/29/2016: Magnesium 2.0 12/02/2016: Hemoglobin 15.5; Platelets 239.0 12/10/2016: BUN 22; Creatinine, Ser 0.71; Potassium 4.6; Sodium 142    Lipid Panel    Component Value Date/Time   CHOL 210 (H) 08/22/2016 0942   TRIG 149.0 08/22/2016 0942   HDL 67.90 08/22/2016 0942   CHOLHDL 3 08/22/2016 0942   VLDL 29.8 08/22/2016 0942   LDLCALC 112 (H) 08/22/2016 0942   LDLDIRECT 137.6 11/05/2010 0935     Wt Readings from Last 3 Encounters:  12/10/16 171 lb 9.6 oz (77.8 kg)  12/02/16 171 lb (77.6 kg)  11/07/16 172 lb (78 kg)     Other studies Reviewed: Additional studies/ records that were reviewed today include: office notes, hospital records and testing.  ASSESSMENT AND PLAN:  1.  Chronic systolic CHF: Weight is stable, sx at baseline. Continue current Lasix, BB, Entresto. Prior auth performed and pt will be able to afford it.    2. HTN: BP well-controlled on current rx  3. Cardiomyopathy: unclear if NICM or ICM. Encouraged pt to have cath but she is extremely reluctant. Will do stress echo first (think MV would be high risk due to low EF and pt may have poor images due to breast attn). If she does very well, consider this NICM.  4. Pancreatic cyst/mass: Pt does not remember being told about it and has not  had it rechecked. Will repeat CT, if no change>>benign.  Current medicines are reviewed at length with the patient today.  The patient has concerns regarding medicines. Concerns were addressed.  The following changes have been made:  no change  Labs/ tests ordered today include:   Orders Placed This Encounter  Procedures  . CT ABDOMEN WO CONTRAST     Disposition:   FU with [name]  Signed, [name], PA-C  12/12/2016 9:47 AM    [place] Phone: [phone]; Fax: [phone]  This note was written with the assistance of speech recognition software. Please excuse any transcriptional errors.

## 2016-12-11 ENCOUNTER — Telehealth: Payer: Self-pay | Admitting: Physician Assistant

## 2016-12-11 ENCOUNTER — Telehealth: Payer: Self-pay | Admitting: Cardiovascular Disease

## 2016-12-11 NOTE — Telephone Encounter (Signed)
Spoke with pt-need to call [facility] imaging

## 2016-12-11 NOTE — Telephone Encounter (Signed)
F/u message  Pt call requesting to speak with RN to f/u on CT. Please call back to discuss

## 2016-12-11 NOTE — Telephone Encounter (Signed)
Faxed to [employer] [facility] a Land Form for ENTRESTO.

## 2016-12-11 NOTE — Telephone Encounter (Signed)
S/w Mardene Celeste at [employer] order they do not do limited CT changed to CT abd wo contrast also notified Mardene Celeste at [facility] make sure no contrast per pt request

## 2016-12-11 NOTE — Telephone Encounter (Signed)
New message      Calling to ask someone to change the order in the system to CT abdominal/pelvis with and without contrast.  It is currently in the system incorrect and appt cannot be scheduled

## 2016-12-11 NOTE — Telephone Encounter (Signed)
error 

## 2016-12-12 ENCOUNTER — Telehealth: Payer: Self-pay | Admitting: Physician Assistant

## 2016-12-12 DIAGNOSIS — K862 Cyst of pancreas: Secondary | ICD-10-CM

## 2016-12-12 NOTE — Telephone Encounter (Signed)
New message  Kristeen Miss from [place] call requesting to speak with RN about an Epic order that was put in the system. Please call back to discuss

## 2016-12-12 NOTE — Telephone Encounter (Signed)
Spoke with Tina May, the CT scan has to be with contrast per the radiologist or they will not be able to see the cyst. They has spoke with the patient and she is aware they will need to use contrast. New orders placed.

## 2016-12-15 ENCOUNTER — Other Ambulatory Visit: Payer: Self-pay | Admitting: Physician Assistant

## 2016-12-15 NOTE — Telephone Encounter (Signed)
Denial letter for Tina May from [place] received via fax. Denial reason: Per Medicare Part D guidelines a request to cover a higher tier drug at a lower tier cost-sharing level can be submitted if there is a lower tier drug with the same brand or generic status that is approved for treating the same diagnosis that the requested, higher tier, drug is being used to treat. There is no drugs In the lower tiers approved for treating the same diagnosis as identified in this request, therefore, a tier exception is not allowed.   Will forward to [employer], PA-c as Juluis Rainier.

## 2016-12-18 ENCOUNTER — Telehealth (HOSPITAL_COMMUNITY): Payer: Self-pay | Admitting: *Deleted

## 2016-12-18 NOTE — Telephone Encounter (Signed)
Left message on voicemail in reference to upcoming appointment scheduled for 12/22/16. Phone number given for a call back so details instructions can be given. Veronia Beets

## 2016-12-19 ENCOUNTER — Ambulatory Visit
Admission: RE | Admit: 2016-12-19 | Discharge: 2016-12-19 | Disposition: A | Discharge status: Home / Self Care | Payer: PPO | Source: Ambulatory Visit | Attending: Physician Assistant | Admitting: Physician Assistant

## 2016-12-19 ENCOUNTER — Other Ambulatory Visit: Payer: PPO

## 2016-12-19 DIAGNOSIS — K862 Cyst of pancreas: Secondary | ICD-10-CM | POA: Diagnosis not present

## 2016-12-19 DIAGNOSIS — N281 Cyst of kidney, acquired: Secondary | ICD-10-CM | POA: Diagnosis not present

## 2016-12-19 DIAGNOSIS — K7689 Other specified diseases of liver: Secondary | ICD-10-CM | POA: Diagnosis not present

## 2016-12-19 MED ORDER — IOPAMIDOL (ISOVUE-300) INJECTION 61%
100.0000 mL | Freq: Once | INTRAVENOUS | Status: AC | PRN
  Administered 2016-12-19: 100 mL via INTRAVENOUS

## 2016-12-22 ENCOUNTER — Ambulatory Visit (HOSPITAL_BASED_OUTPATIENT_CLINIC_OR_DEPARTMENT_OTHER): Payer: PPO

## 2016-12-22 ENCOUNTER — Ambulatory Visit (HOSPITAL_COMMUNITY): Payer: PPO | Attending: Cardiovascular Disease

## 2016-12-22 DIAGNOSIS — I110 Hypertensive heart disease with heart failure: Secondary | ICD-10-CM | POA: Diagnosis not present

## 2016-12-22 DIAGNOSIS — J45909 Unspecified asthma, uncomplicated: Secondary | ICD-10-CM | POA: Diagnosis not present

## 2016-12-22 DIAGNOSIS — I429 Cardiomyopathy, unspecified: Secondary | ICD-10-CM | POA: Diagnosis not present

## 2016-12-22 DIAGNOSIS — I5022 Chronic systolic (congestive) heart failure: Secondary | ICD-10-CM | POA: Insufficient documentation

## 2016-12-22 DIAGNOSIS — E785 Hyperlipidemia, unspecified: Secondary | ICD-10-CM | POA: Diagnosis not present

## 2016-12-23 ENCOUNTER — Encounter: Payer: Self-pay | Admitting: *Deleted

## 2017-01-06 ENCOUNTER — Other Ambulatory Visit: Payer: Self-pay

## 2017-01-06 DIAGNOSIS — I426 Alcoholic cardiomyopathy: Secondary | ICD-10-CM

## 2017-01-12 ENCOUNTER — Other Ambulatory Visit: Payer: Self-pay

## 2017-01-12 ENCOUNTER — Ambulatory Visit (INDEPENDENT_AMBULATORY_CARE_PROVIDER_SITE_OTHER): Payer: PPO | Admitting: Physician Assistant

## 2017-01-12 ENCOUNTER — Encounter: Payer: Self-pay | Admitting: Physician Assistant

## 2017-01-12 VITALS — BP 126/82 | HR 72 | Ht 62.0 in | Wt 173.0 lb

## 2017-01-12 DIAGNOSIS — I5022 Chronic systolic (congestive) heart failure: Secondary | ICD-10-CM

## 2017-01-12 DIAGNOSIS — I10 Essential (primary) hypertension: Secondary | ICD-10-CM

## 2017-01-12 DIAGNOSIS — I429 Cardiomyopathy, unspecified: Secondary | ICD-10-CM

## 2017-01-12 DIAGNOSIS — I426 Alcoholic cardiomyopathy: Secondary | ICD-10-CM

## 2017-01-12 NOTE — Patient Instructions (Addendum)
Medication Instructions:  NO CHANGES Your physician recommends that you continue on your current medications as directed. Please refer to the Current Medication list given to you today. If you need a refill on your cardiac medications before your next appointment, please call your pharmacy.  Labwork: Before scheduled cath procedure  Testing/Procedures:  CARDIAC  CATHETERIZATION PROCEDURE SCHEDULED 03-02-2017 @ 930 AM  Follow-Up: Your physician wants you to follow-up in: [place] 02-23-2017 @ 940AM .    Thank you for choosing [facility] at [facility]!!       [place] [address] [place] [address] Dept: [phone] Loc: [place]  01/12/2017  You are scheduled for a Cardiac Catheterization on Monday, October 29 with Dr. Lauree Chandler.  1. Please arrive at the [facility] (Main Entrance A) at [facility]: [place], [place] [address] at 9:30 AM (two and 1/2 hours  before your procedure to ensure your preparation). Free valet parking service is available.   Special note: Every effort is made to have your procedure done on time. Please understand that emergencies sometimes delay scheduled procedures.  2. Diet: Do not eat or drink anything after midnight prior to your procedure except sips of water to take medications.  3. Labs: Your labs will be performed at the hospital after you arrive for your procedure.  4. Medication instructions in preparation for your procedure:  Stop taking Furosemide on Monday, October 29.    MAKE SURE TO TAKE YOU ASPIRIN 81MG  THE DAY OF PROCEDURE  Stop taking, FURSEMIDE 40MG -THE DAY OF THE PROCEDUREon Wednesday, September12.  On the morning of your procedure, take your Aspirin 81MG  and any morning medicines NOT listed above.  You may use sips of water.  5. Plan for one night  stay--bring personal belongings. 6. Bring a current list of your medications and current insurance cards. 7. You MUST have a responsible person to drive you home. 8. Someone MUST be with you the first 24 hours after you arrive home or your discharge will be delayed. 9. Please wear clothes that are easy to get on and off and wear slip-on shoes.  Thank you for allowing [place] to care for you!   -- [facility]

## 2017-01-12 NOTE — Progress Notes (Signed)
Cardiology Office Note   Date:  01/12/2017   ID:  Tina May, Tina May December 30, 1945, MRN [id number]  PCP:  Tina Morale, MD  Cardiologist:  Dr. Acie May in the hospital 10/2016  Tina May, Tina May 12/10/2016  History of Present Illness: Tina May is a 71 y.o. female with a history of  asthma (mold is a trigger), cardiomyopathy (no ischemic testing yet), chronic systolic CHF with an EF of 30-35 percent on 11/19/2016 echo, HTN, HLD, pancreatic lesion (ok now, f/u CT in 12/2017 to confirm).  Tina May presents for cardiology follow up.  She is feeling well. She wonders how long after the cath she can play golf.   She is otherwise doing well. She denies SOB, DOE, LE edema, chest pain. She does not feel any limitations due to her heart.  She is staying active around the house and yard, plays golf 3 x week normally.  She is close to winning the club championship. She is in golf tournaments coming up.   She recently went to [facility] and was able to walk all around the city without any problems.    Past Medical History:  Diagnosis Date  . Allergy   . Arthritis   . Asthma   . 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 Cataract    starting  . Diverticulosis   . External hemorrhoids   . Hepatic cyst   . Hyperlipidemia   . Hypertension   . Pancreatic lesion 03/11/13    Past Surgical History:  Procedure Laterality Date  . ABDOMINAL HYSTERECTOMY    . COLONOSCOPY  08-06-07   per Dr. Olevia May, diverticulae only, repeat in 10 yrs   . SMALL INTESTINE SURGERY     from small intestine    Current Outpatient Prescriptions  Medication Sig Dispense Refill  . albuterol (PROVENTIL HFA;VENTOLIN HFA) 108 (90 Base) MCG/ACT inhaler Inhale 2 puffs into the lungs every 4 (four) hours as needed for wheezing or shortness of breath. 3 Inhaler 3  . aspirin 81 MG tablet Take 81 mg by mouth daily.      . carvedilol (COREG) 6.25 MG tablet TAKE 1 TABLET BY MOUTH TWICE A DAY WITH A MEAL 60 tablet 11  .  ENTRESTO 24-26 MG Take 1 tablet by mouth 2 (two) times daily.  0  . estradiol (ESTRACE) 0.5 MG tablet Take 0.5 mg by mouth once a week.    . furosemide (LASIX) 40 MG tablet Take 1 tablet (40 mg total) by mouth daily. 32 tablet 6  . Hyaluronic Acid-Vitamin C (HYALURONIC ACID PO) Take 1 tablet by mouth daily.    . meloxicam (MOBIC) 15 MG tablet Take 1 tablet (15 mg total) by mouth daily. 90 tablet 3  . Multiple Vitamin (MULTIVITAMIN) tablet Take 1 tablet by mouth daily.      . potassium chloride SA (K-DUR,KLOR-CON) 10 MEQ tablet Take 1 tablet (10 mEq total) by mouth daily. 30 tablet 6  . RESTASIS 0.05 % ophthalmic emulsion Place 1 drop into both eyes 2 (two) times daily.     . rosuvastatin (CRESTOR) 5 MG tablet Take 5 mg by mouth daily.    . Turmeric Curcumin 500 MG CAPS Take 1,000 mg by mouth daily.     No current facility-administered medications for this visit.     Allergies:   Atorvastatin; Diclofenac; and Morphine and related    Social History:  The patient  reports that she quit smoking about 25 years ago.  Her smoking use included Cigarettes. She has a 20.00 pack-year smoking history. She has never used smokeless tobacco. She reports that she drinks about 4.2 oz of alcohol per week . She reports that she does not use drugs.   Family History:  The patient's family history includes Healthy in her brother and sister; Hyperlipidemia in her unknown relative; Hypertension in her unknown relative; Prostate cancer in her unknown relative.    ROS:  Please see the history of present illness. All other systems are reviewed and negative.    PHYSICAL EXAM: VS:  BP 126/82   Pulse 72   Ht 5\' 2"  (1.575 m)   Wt 173 lb (78.5 kg)   BMI 31.64 kg/m  , BMI Body mass index is 31.64 kg/m. GEN: Well nourished, well developed, female in no acute distress  HEENT: normal for age  Neck: no JVD, no carotid bruit, no masses Cardiac: RRR; no murmur, no rubs, or gallops Respiratory:  clear to auscultation  bilaterally, normal work of breathing GI: soft, nontender, nondistended, + BS MS: no deformity or atrophy; no edema; distal pulses are 2+ in all 4 extremities   Skin: warm and dry, no rash Neuro:  Strength and sensation are intact Psych: euthymic mood, full affect   EKG:  EKG is ordered today. The ekg ordered today demonstrates SR, HR 72, decreased amplitude QRS complexes in the lateral leads from 10/26/2016, normal intervals  Stress echo: 12/22/2016 Study Conclusions - Stress ECG conclusions: There were no stress arrhythmias or   conduction abnormalities. The stress ECG was normal. Impressions: - Left ventricular systolic function did not improve from baseline,   suggesting poor myocardial reserve.   No regional wall motion abnormalities and no ischemic ECG changes   were seen following exercise. Recommendations:  No evidence of stress induced ischemia, but myocardial reserve is poor.  ECHO: 11/19/2016 - Left ventricle: The cavity size was normal. There was mild   concentric hypertrophy. Systolic function was moderately to   severely reduced. The estimated ejection fraction was in the   range of 30% to 35%. Diffuse hypokinesis. Doppler parameters are   consistent with abnormal left ventricular relaxation (grade 1   diastolic dysfunction). - Aortic valve: There was mild regurgitation. - Mitral valve: Calcified annulus. There was mild regurgitation. - Tricuspid valve: There was mild regurgitation. - Pericardium, extracardiac: A small, mostly posterior pericardial   effusion was identified circumferential to the heart. Impressions: - There has been mild improvement in EF since prior study.  Recent Labs: 05/09/2016: ALT 24 10/26/2016: B Natriuretic Peptide 483.2; TSH 1.050 10/29/2016: Magnesium 2.0 12/02/2016: Hemoglobin 15.5; Platelets 239.0 12/10/2016: BUN 22; Creatinine, Ser 0.71; Potassium 4.6; Sodium 142    Lipid Panel    Component Value Date/Time   CHOL 210 (H)  08/22/2016 0942   TRIG 149.0 08/22/2016 0942   HDL 67.90 08/22/2016 0942   CHOLHDL 3 08/22/2016 0942   VLDL 29.8 08/22/2016 0942   LDLCALC 112 (H) 08/22/2016 0942   LDLDIRECT 137.6 11/05/2010 0935     Wt Readings from Last 3 Encounters:  01/12/17 173 lb (78.5 kg)  12/10/16 171 lb 9.6 oz (77.8 kg)  12/02/16 171 lb (77.6 kg)     Other studies Reviewed: Additional studies/ records that were reviewed today include: Office notes, hospital records and testing.  ASSESSMENT AND PLAN:  1.  Chronic systolic CHF: Her weight is up 2 pounds better volume status is good by exam. She is reporting no symptoms. She is on good therapy with  carvedilol, Lasix, Entresto. Her blood pressure has been in the 100s and one teens at times, so I have not added Imdur or gone up on the [place]. Adding spironolactone would also be an option. She is encouraged to continue to limit sodium and follow her daily weights.  2. 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 I explained that most of those patients improve and we had not seen a great deal of improvement so far. That is one of the reasons I feel ischemic testing is indicated. She only got up to stage II on her stress echo, and there was no improvement in her EF with exertion. 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 Cardiac catheterization is indicated.   The risks and benefits of a cardiac catheterization including, but not limited to, death, stroke, MI, kidney damage and bleeding were discussed with the patient who indicates understanding and agrees to proceed.   However, there are some scheduling issues and she may need to reschedule. Otherwise, keep current appointment for 01/14/2017.  Check labs the day of the procedure.   Current medicines are reviewed at length with the patient today.  The patient does not have concerns regarding medicines.  The following changes have been made:   no change  Labs/ tests ordered today include:  No orders of the defined types were placed in this encounter.    Disposition:   FU with Dr. Acie May  Signed, [name]  01/12/2017 9:13 AM    [place] Phone: [phone]; Fax: [phone]  This note was written with the assistance of speech recognition software. Please excuse any transcriptional errors.

## 2017-01-26 ENCOUNTER — Telehealth: Payer: Self-pay

## 2017-01-26 DIAGNOSIS — I426 Alcoholic cardiomyopathy: Secondary | ICD-10-CM | POA: Diagnosis not present

## 2017-01-26 LAB — BASIC METABOLIC PANEL
BUN / CREAT RATIO: 33 — AB (ref 12–28)
BUN: 23 mg/dL (ref 8–27)
CHLORIDE: 102 mmol/L (ref 96–106)
CO2: 26 mmol/L (ref 20–29)
Calcium: 9.4 mg/dL (ref 8.7–10.3)
Creatinine, Ser: 0.69 mg/dL (ref 0.57–1.00)
GFR calc non Af Amer: 88 mL/min/{1.73_m2} (ref >59)
GFR, EST AFRICAN AMERICAN: 101 mL/min/{1.73_m2} (ref >59)
Glucose: 97 mg/dL (ref 65–99)
Potassium: 4 mmol/L (ref 3.5–5.2)
Sodium: 143 mmol/L (ref 134–144)

## 2017-01-26 LAB — PROTIME-INR
INR: 1 (ref 0.8–1.2)
Prothrombin Time: 10 s (ref 9.1–12.0)

## 2017-01-26 LAB — CBC
Hematocrit: 41.1 % (ref 34.0–46.6)
Hemoglobin: 13.8 g/dL (ref 11.1–15.9)
MCH: 30.8 pg (ref 26.6–33.0)
MCHC: 33.6 g/dL (ref 31.5–35.7)
MCV: 92 fL (ref 79–97)
PLATELETS: 274 10*3/uL (ref 150–379)
RBC: 4.48 x10E6/uL (ref 3.77–5.28)
RDW: 14.5 % (ref 12.3–15.4)
WBC: 5.6 10*3/uL (ref 3.4–10.8)

## 2017-01-26 NOTE — Telephone Encounter (Signed)
Left message per DPR.  Patient contacted pre-catheterization at [facility] scheduled for:  01/27/2017 @ 0730 Verified arrival time and place:  NT @ 0530 Confirmed AM meds to be taken pre-cath with sip of water: Take ASA Hold lasix Patient must have responsible person to drive home post procedure and observe patient for 24 hours Addl concerns:  Left this nurse name and # for any questions/concerns

## 2017-01-26 NOTE — H&P (Signed)
Cardiology History and Physical   Date:  01/12/2017   ID:  May, Tina [birth year], MRN [id number]  PCP:  [name], MD      Cardiologist:  [name] in the hospital 10/2016  [name], [name] 12/10/2016  History of Present Illness: Tina May is a 71 y.o. female with a history of asthma (mold is a trigger), cardiomyopathy (no ischemic testing yet), chronic systolic CHF with an EF of 30-35 percent on 11/19/2016 echo, HTN, HLD,pancreatic lesion (ok now, f/u CT in 12/2017 to confirm).  Tina May presents for cardiology follow up.  She is feeling well. She wonders how long after the cath she can play golf.   She is otherwise doing well. She denies SOB, DOE, LE edema, chest pain. She does not feel any limitations due to her heart.  She is staying active around the house and yard, plays golf 3 x week normally.  She is close to winning the club championship. She is in golf tournaments coming up.   She recently went to [facility] and was able to walk all around the city without any problems.   She denies orthopnea or PND. She denies palpitations, presyncope or syncope.       Past Medical History:  Diagnosis Date  . Allergy   . Arthritis   . Asthma   . 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 Cataract    starting  . Diverticulosis   . External hemorrhoids   . Hepatic cyst   . Hyperlipidemia   . Hypertension   . Pancreatic lesion 03/11/13         Past Surgical History:  Procedure Laterality Date  . ABDOMINAL HYSTERECTOMY    . COLONOSCOPY  08-06-07   per Dr. [name], diverticulae only, repeat in 10 yrs   . SMALL INTESTINE SURGERY     from small intestine          Current Outpatient Prescriptions  Medication Sig Dispense Refill  . albuterol (PROVENTIL HFA;VENTOLIN HFA) 108 (90 Base) MCG/ACT inhaler Inhale 2 puffs into the lungs every 4 (four) hours as needed for wheezing or shortness of breath. 3 Inhaler 3  . aspirin  81 MG tablet Take 81 mg by mouth daily.      . carvedilol (COREG) 6.25 MG tablet TAKE 1 TABLET BY MOUTH TWICE A DAY WITH A MEAL 60 tablet 11  . ENTRESTO 24-26 MG Take 1 tablet by mouth 2 (two) times daily.  0  . estradiol (ESTRACE) 0.5 MG tablet Take 0.5 mg by mouth once a week.    . furosemide (LASIX) 40 MG tablet Take 1 tablet (40 mg total) by mouth daily. 32 tablet 6  . Hyaluronic Acid-Vitamin C (HYALURONIC ACID PO) Take 1 tablet by mouth daily.    . meloxicam (MOBIC) 15 MG tablet Take 1 tablet (15 mg total) by mouth daily. 90 tablet 3  . Multiple Vitamin (MULTIVITAMIN) tablet Take 1 tablet by mouth daily.      . potassium chloride SA (K-DUR,KLOR-CON) 10 MEQ tablet Take 1 tablet (10 mEq total) by mouth daily. 30 tablet 6  . RESTASIS 0.05 % ophthalmic emulsion Place 1 drop into both eyes 2 (two) times daily.     . rosuvastatin (CRESTOR) 5 MG tablet Take 5 mg by mouth daily.    . Turmeric Curcumin 500 MG CAPS Take 1,000 mg by mouth daily.     No current facility-administered medications for this visit.  Allergies:   Atorvastatin; Diclofenac; and Morphine and related    Social History:  The patient  reports that she quit smoking about 25 years ago. Her smoking use included Cigarettes. She has a 20.00 pack-year smoking history. She has never used smokeless tobacco. She reports that she drinks about 4.2 oz of alcohol per week . She reports that she does not use drugs.   Family History:  The patient's family history includes Healthy in her brother and sister; Hyperlipidemia in her unknown relative; Hypertension in her unknown relative; Prostate cancer in her unknown relative.    ROS:  Please see the history of present illness. All other systems are reviewed and negative.    PHYSICAL EXAM: VS:  BP 126/82   Pulse 72   Ht 5\' 2"  (1.575 m)   Wt 173 lb (78.5 kg)   BMI 31.64 kg/m  , BMI Body mass index is 31.64 kg/m. GEN: Well nourished, well developed, female in  no acute distress  HEENT: normal for age  Neck: no JVD, no carotid bruit, no masses Cardiac: RRR; no murmur, no rubs, or gallops Respiratory:  clear to auscultation bilaterally, normal work of breathing GI: soft, nontender, nondistended, + BS MS: no deformity or atrophy; no edema; distal pulses are 2+ in all 4 extremities   Skin: warm and dry, no rash Neuro:  Strength and sensation are intact Psych: euthymic mood, full affect   EKG:  EKG is  reviewed. The ekg ordered today demonstrates SR, HR 72, decreased amplitude QRS complexes in the lateral leads from 10/26/2016, normal intervals  Stress echo: 12/22/2016 Study Conclusions - Stress ECG conclusions: There were no stress arrhythmias or conduction abnormalities. The stress ECG was normal. Impressions: - Left ventricular systolic function did not improve from baseline, suggesting poor myocardial reserve. No regional wall motion abnormalities and no ischemic ECG changes were seen following exercise. Recommendations: No evidence of stress induced ischemia, but myocardial reserve is poor.  ECHO: 11/19/2016 - Left ventricle: The cavity size was normal. There was mild concentric hypertrophy. Systolic function was moderately to severely reduced. The estimated ejection fraction was in the range of 30% to 35%. Diffuse hypokinesis. Doppler parameters are consistent with abnormal left ventricular relaxation (grade 1 diastolic dysfunction). - Aortic valve: There was mild regurgitation. - Mitral valve: Calcified annulus. There was mild regurgitation. - Tricuspid valve: There was mild regurgitation. - Pericardium, extracardiac: A small, mostly posterior pericardial effusion was identified circumferential to the heart. Impressions: - There has been mild improvement in EF since prior study.  Recent Labs: 05/09/2016: ALT 24 10/26/2016: B Natriuretic Peptide 483.2; TSH 1.050 10/29/2016: Magnesium 2.0 12/02/2016:  Hemoglobin 15.5; Platelets 239.0 12/10/2016: BUN 22; Creatinine, Ser 0.71; Potassium 4.6; Sodium 142    Lipid Panel Labs (Brief)          Component Value Date/Time   CHOL 210 (H) 08/22/2016 0942   TRIG 149.0 08/22/2016 0942   HDL 67.90 08/22/2016 0942   CHOLHDL 3 08/22/2016 0942   VLDL 29.8 08/22/2016 0942   LDLCALC 112 (H) 08/22/2016 0942   LDLDIRECT 137.6 11/05/2010 0935          Wt Readings from Last 3 Encounters:  01/12/17 173 lb (78.5 kg)  12/10/16 171 lb 9.6 oz (77.8 kg)  12/02/16 171 lb (77.6 kg)     Other studies Reviewed: Additional studies/ records that were reviewed today include: Office notes, hospital records and testing.  ASSESSMENT AND PLAN:  1.  Chronic systolic CHF: Her weight is up 2  pounds but her volume status is good by exam. She is reporting no symptoms. She is on good therapy with carvedilol, Lasix, Entresto. Her blood pressure has been in the 100s and one teens at times, so I have not added Imdur or gone up on the [place]. Adding spironolactone would also be an option. She is encouraged to continue to limit sodium and follow her daily weights.  2. 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 I explained that most of those patients improve and we had not seen a great deal of improvement so far. That is one of the reasons I feel ischemic testing is indicated. She only got up to stage II on her stress echo, and there was no improvement in her EF with exertion. 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 Cardiac catheterization is indicated.   The risks and benefits of a cardiac catheterization including, but not limited to, death, stroke, MI, kidney damage and bleeding were discussed with the patient who indicates understanding and agrees to proceed.   This has been scheduled for 01/27/2017.  Check labs the day before the procedure.   Current medicines are reviewed  at length with the patient today.  The patient does not have concerns regarding medicines.  The following changes have been made:  no change  Labs/ tests ordered today include:  No orders of the defined types were placed in this encounter.    Disposition:   FU with [name]  Signed, [name]  01/12/2017 9:13 AM    [place] Phone: [phone]; Fax: [phone]  This note was written with the assistance of speech recognition software. Please excuse any transcriptional errors.  Attending Note:   The patient was seen and examined.  Agree with assessment and plan as noted above.  Changes made to the above note as needed.  Patient seen and independently examined with [name], PA .   We discussed all aspects of the encounter. I agree with the assessment and plan as stated above.  1.   CHF :   Will proceed with cardiac cath .  Her stress echo did not show any improvement in her LV function during the stress echo .   Have discussed risks / benefits/ options  She understands and agrees to proceed.     I have spent a total of 40 minutes with patient reviewing hospital  notes , telemetry, EKGs, labs and examining patient as well as establishing an assessment and plan that was discussed with the patient. > 50% of time was spent in direct patient care.    Thayer Headings, Brooke Bonito., MD, [facility] 01/26/2017, 1:37 PM [address]  [place] Pager [phone]

## 2017-01-27 ENCOUNTER — Encounter (HOSPITAL_COMMUNITY): Payer: Self-pay | Admitting: Cardiology

## 2017-01-27 ENCOUNTER — Ambulatory Visit (HOSPITAL_COMMUNITY)
Admission: RE | Disposition: A | Discharge status: Home / Self Care | Payer: Self-pay | Source: Ambulatory Visit | Attending: Cardiovascular Disease

## 2017-01-27 ENCOUNTER — Ambulatory Visit (HOSPITAL_COMMUNITY)
Admission: RE | Admit: 2017-01-27 | Discharge: 2017-01-27 | Disposition: A | Discharge status: Home / Self Care | Payer: PPO | Source: Ambulatory Visit | Attending: Cardiovascular Disease | Admitting: Cardiovascular Disease

## 2017-01-27 DIAGNOSIS — I10 Essential (primary) hypertension: Secondary | ICD-10-CM | POA: Diagnosis present

## 2017-01-27 DIAGNOSIS — J45909 Unspecified asthma, uncomplicated: Secondary | ICD-10-CM | POA: Diagnosis not present

## 2017-01-27 DIAGNOSIS — Z87891 Personal history of nicotine dependence: Secondary | ICD-10-CM | POA: Diagnosis not present

## 2017-01-27 DIAGNOSIS — E785 Hyperlipidemia, unspecified: Secondary | ICD-10-CM | POA: Diagnosis present

## 2017-01-27 DIAGNOSIS — I5022 Chronic systolic (congestive) heart failure: Secondary | ICD-10-CM | POA: Diagnosis present

## 2017-01-27 DIAGNOSIS — Z7982 Long term (current) use of aspirin: Secondary | ICD-10-CM | POA: Diagnosis not present

## 2017-01-27 DIAGNOSIS — I429 Cardiomyopathy, unspecified: Secondary | ICD-10-CM | POA: Diagnosis not present

## 2017-01-27 DIAGNOSIS — Z85828 Personal history of other malignant neoplasm of skin: Secondary | ICD-10-CM | POA: Insufficient documentation

## 2017-01-27 DIAGNOSIS — I110 Hypertensive heart disease with heart failure: Secondary | ICD-10-CM | POA: Diagnosis not present

## 2017-01-27 DIAGNOSIS — Z79899 Other long term (current) drug therapy: Secondary | ICD-10-CM | POA: Diagnosis not present

## 2017-01-27 HISTORY — PX: LEFT HEART CATH AND CORONARY ANGIOGRAPHY: CATH118249

## 2017-01-27 SURGERY — LEFT HEART CATH AND CORONARY ANGIOGRAPHY
Anesthesia: LOCAL

## 2017-01-27 MED ORDER — SODIUM CHLORIDE 0.9 % WEIGHT BASED INFUSION
3.0000 mL/kg/h | INTRAVENOUS | Status: AC
  Administered 2017-01-27: 3 mL/kg/h via INTRAVENOUS

## 2017-01-27 MED ORDER — MIDAZOLAM HCL 2 MG/2ML IJ SOLN
INTRAMUSCULAR | Status: DC | PRN
  Administered 2017-01-27 (×2): 1 mg via INTRAVENOUS
  Administered 2017-01-27: 2 mg via INTRAVENOUS

## 2017-01-27 MED ORDER — VERAPAMIL HCL 2.5 MG/ML IV SOLN
INTRAVENOUS | Status: AC
  Filled 2017-01-27: qty 2

## 2017-01-27 MED ORDER — IOPAMIDOL (ISOVUE-370) INJECTION 76%
INTRAVENOUS | Status: AC
  Filled 2017-01-27: qty 100

## 2017-01-27 MED ORDER — SODIUM CHLORIDE 0.9 % WEIGHT BASED INFUSION: 1.0000 mL/kg/h | INTRAVENOUS | Status: DC

## 2017-01-27 MED ORDER — HEPARIN SODIUM (PORCINE) 1000 UNIT/ML IJ SOLN
INTRAMUSCULAR | Status: DC | PRN
  Administered 2017-01-27: 4000 [IU] via INTRAVENOUS

## 2017-01-27 MED ORDER — MIDAZOLAM HCL 2 MG/2ML IJ SOLN
INTRAMUSCULAR | Status: AC
  Filled 2017-01-27: qty 2

## 2017-01-27 MED ORDER — SODIUM CHLORIDE 0.9% FLUSH: 3.0000 mL | INTRAVENOUS | Status: DC | PRN

## 2017-01-27 MED ORDER — HEPARIN (PORCINE) IN NACL 2-0.9 UNIT/ML-% IJ SOLN
INTRAMUSCULAR | Status: AC
  Filled 2017-01-27: qty 1000

## 2017-01-27 MED ORDER — HEPARIN SODIUM (PORCINE) 1000 UNIT/ML IJ SOLN
INTRAMUSCULAR | Status: AC
  Filled 2017-01-27: qty 1

## 2017-01-27 MED ORDER — LIDOCAINE HCL 2 % IJ SOLN
INTRAMUSCULAR | Status: AC
  Filled 2017-01-27: qty 10

## 2017-01-27 MED ORDER — FENTANYL CITRATE (PF) 100 MCG/2ML IJ SOLN
INTRAMUSCULAR | Status: AC
  Filled 2017-01-27: qty 2

## 2017-01-27 MED ORDER — IOPAMIDOL (ISOVUE-370) INJECTION 76%
INTRAVENOUS | Status: DC | PRN
  Administered 2017-01-27: 60 mL via INTRA_ARTERIAL

## 2017-01-27 MED ORDER — SODIUM CHLORIDE 0.9 % IV SOLN: 250.0000 mL | INTRAVENOUS | Status: DC | PRN

## 2017-01-27 MED ORDER — SODIUM CHLORIDE 0.9% FLUSH: 3.0000 mL | Freq: Two times a day (BID) | INTRAVENOUS | Status: DC

## 2017-01-27 MED ORDER — FENTANYL CITRATE (PF) 100 MCG/2ML IJ SOLN
INTRAMUSCULAR | Status: DC | PRN
  Administered 2017-01-27 (×2): 25 ug via INTRAVENOUS

## 2017-01-27 MED ORDER — VERAPAMIL HCL 2.5 MG/ML IV SOLN
INTRAVENOUS | Status: DC | PRN
  Administered 2017-01-27: 10 mL via INTRA_ARTERIAL

## 2017-01-27 MED ORDER — ASPIRIN 81 MG PO CHEW: 81.0000 mg | CHEWABLE_TABLET | Freq: Once | ORAL | Status: DC

## 2017-01-27 MED ORDER — LIDOCAINE HCL (PF) 1 % IJ SOLN
INTRAMUSCULAR | Status: DC | PRN
  Administered 2017-01-27: 2 mL

## 2017-01-27 MED ORDER — HEPARIN (PORCINE) IN NACL 2-0.9 UNIT/ML-% IJ SOLN
INTRAMUSCULAR | Status: DC | PRN
  Administered 2017-01-27: 08:00:00

## 2017-01-27 SURGICAL SUPPLY — 12 items
CATH 5FR JL3.5 JR4 ANG PIG MP (CATHETERS) ×2 IMPLANT
COVER PRB 48X5XTLSCP FOLD TPE (BAG) ×1 IMPLANT
COVER PROBE 5X48 (BAG) ×1
DEVICE RAD COMP TR BAND LRG (VASCULAR PRODUCTS) ×2 IMPLANT
GLIDESHEATH SLEND SS 6F .021 (SHEATH) ×2 IMPLANT
GUIDEWIRE INQWIRE 1.5J.035X260 (WIRE) ×1 IMPLANT
INQWIRE 1.5J .035X260CM (WIRE) ×2
KIT HEART LEFT (KITS) ×2 IMPLANT
PACK CARDIAC CATHETERIZATION (CUSTOM PROCEDURE TRAY) ×2 IMPLANT
SYR MEDRAD MARK V 150ML (SYRINGE) ×2 IMPLANT
TRANSDUCER W/STOPCOCK (MISCELLANEOUS) ×2 IMPLANT
TUBING CIL FLEX 10 FLL-RA (TUBING) ×2 IMPLANT

## 2017-01-27 NOTE — Discharge Instructions (Signed)

## 2017-01-27 NOTE — Interval H&P Note (Signed)
History and Physical Interval Note:  01/27/2017 7:27 AM  Yong Channel  has presented today for surgery, with the diagnosis of cm  The various methods of treatment have been discussed with the patient and family. After consideration of risks, benefits and other options for treatment, the patient has consented to  Procedure(s): LEFT HEART CATH AND CORONARY ANGIOGRAPHY (N/A) as a surgical intervention .  The patient's history has been reviewed, patient examined, no change in status, stable for surgery.  I have reviewed the patient's chart and labs.  Questions were answered to the patient's satisfaction.   Cath Lab Visit (complete for each Cath Lab visit)  Clinical Evaluation Leading to the Procedure:   ACS: No.  Non-ACS:    Anginal Classification: CCS I  Anti-ischemic medical therapy: Minimal Therapy (1 class of medications)  Non-Invasive Test Results: Intermediate-risk stress test findings: cardiac mortality 1-3%/year  Prior CABG: No previous CABG        [facility] 01/27/2017 7:27 AM

## 2017-01-28 MED FILL — Lidocaine HCl Local Inj 2%: INTRAMUSCULAR | Qty: 10 | Status: AC

## 2017-02-02 DIAGNOSIS — L918 Other hypertrophic disorders of the skin: Secondary | ICD-10-CM | POA: Diagnosis not present

## 2017-02-02 DIAGNOSIS — I781 Nevus, non-neoplastic: Secondary | ICD-10-CM | POA: Diagnosis not present

## 2017-02-02 DIAGNOSIS — L821 Other seborrheic keratosis: Secondary | ICD-10-CM | POA: Diagnosis not present

## 2017-02-02 DIAGNOSIS — L820 Inflamed seborrheic keratosis: Secondary | ICD-10-CM | POA: Diagnosis not present

## 2017-02-02 DIAGNOSIS — L570 Actinic keratosis: Secondary | ICD-10-CM | POA: Diagnosis not present

## 2017-02-12 ENCOUNTER — Telehealth: Payer: Self-pay | Admitting: Family Medicine

## 2017-02-12 NOTE — Telephone Encounter (Signed)
Call in #[phone] with 5 rf. To use bid prn anxiety.

## 2017-02-12 NOTE — Telephone Encounter (Signed)
Refill request for Lorazepam 0.5 mg and send to [facility] in [facility].

## 2017-02-13 MED ORDER — LORAZEPAM 0.5 MG PO TABS: 0.5000 mg | ORAL_TABLET | Freq: Two times a day (BID) | ORAL | 5 refills | Status: DC | PRN

## 2017-02-13 NOTE — Telephone Encounter (Signed)
I called in script to [facility].

## 2017-02-23 ENCOUNTER — Encounter (INDEPENDENT_AMBULATORY_CARE_PROVIDER_SITE_OTHER): Payer: Self-pay

## 2017-02-23 ENCOUNTER — Ambulatory Visit (INDEPENDENT_AMBULATORY_CARE_PROVIDER_SITE_OTHER): Payer: PPO | Admitting: Cardiovascular Disease

## 2017-02-23 ENCOUNTER — Encounter: Payer: Self-pay | Admitting: Cardiovascular Disease

## 2017-02-23 VITALS — BP 128/64 | HR 73 | Ht 62.0 in | Wt 172.0 lb

## 2017-02-23 DIAGNOSIS — E782 Mixed hyperlipidemia: Secondary | ICD-10-CM | POA: Diagnosis not present

## 2017-02-23 DIAGNOSIS — I5022 Chronic systolic (congestive) heart failure: Secondary | ICD-10-CM

## 2017-02-23 MED ORDER — SACUBITRIL-VALSARTAN 49-51 MG PO TABS: 1.0000 | ORAL_TABLET | Freq: Two times a day (BID) | ORAL | 6 refills | Status: DC

## 2017-02-23 NOTE — Patient Instructions (Signed)
Medication Instructions:  INCREASE Entresto to 49/51 mg twice daily   Labwork: Your physician recommends that you return for lab work in: 3 weeks for basic metabolic panel   Testing/Procedures: None Ordered   Follow-Up: Your physician recommends that you schedule a follow-up appointment in: 3 months with [name].    If you need a refill on your cardiac medications before your next appointment, please call your pharmacy.   Thank you for choosing [facility]! Christen Bame, RN [phone]

## 2017-02-23 NOTE — Progress Notes (Signed)
Cardiology Office Note:    Date:  02/23/2017   ID:  Libra, Gatz November 23, 1945, MRN [id number]  PCP:  [name], MD  Cardiologist:  Mertie Moores, MD    Referring MD: [name], MD   Problem List 1. Chronic systolic CHF  2. Essential HTN 3. Hyperlipidemia 4. COPD  5. Pancreatic lesion:  Chief Complaint  Patient presents with  . Congestive Heart Failure    History of Present Illness:    Tina May is a 71 y.o. female with a hx of chronic systolic CHF in June, 3220.  I saw her in consultation while in the hospital .  EF by echo at that time was 25-30%.Her last week and is unsure and it looks her heart function is improving as we started at the 25-30 range 45 kg  She has been on Entresto 24-26 mg BID Coreg 6.25 BID Lasix 40 mg a day   Feeling much better Cath 01/27/17 showed mild RCA 10% stenosis - otherwise normal coronaries.  Feeling great. Tolerating the Entresto  No shortness of breath    Past Medical History:  Diagnosis Date  . Allergy   . Arthritis   . Asthma   . 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 Cataract    starting  . Diverticulosis   . External hemorrhoids   . Hepatic cyst   . Hyperlipidemia   . Hypertension   . Pancreatic lesion 03/11/13    Past Surgical History:  Procedure Laterality Date  . ABDOMINAL HYSTERECTOMY    . COLONOSCOPY  08-06-07   per Dr. [name], diverticulae only, repeat in 10 yrs   . LEFT HEART CATH AND CORONARY ANGIOGRAPHY N/A 01/27/2017   Procedure: LEFT HEART CATH AND CORONARY ANGIOGRAPHY;  Surgeon: Martinique, Peter M, MD;  Location: [facility];  Service: Cardiovascular;  Laterality: N/A;  . SMALL INTESTINE SURGERY     from small intestine    Current Medications: Current Meds  Medication Sig  . albuterol (PROVENTIL HFA;VENTOLIN HFA) 108 (90 Base) MCG/ACT inhaler Inhale 2 puffs into the lungs every 4 (four) hours as needed for wheezing or shortness of breath. (Patient taking differently: Inhale 2 puffs  into the lungs every 4 (four) hours as needed for wheezing or shortness of breath ((for mold/mildew exposure)). )  . aspirin EC 81 MG tablet Take 81 mg by mouth daily.  . carvedilol (COREG) 6.25 MG tablet TAKE 1 TABLET BY MOUTH TWICE A DAY WITH A MEAL  . ENTRESTO 24-26 MG Take 1 tablet by mouth 2 (two) times daily.  [name] estradiol (ESTRACE) 0.5 MG tablet Take 0.5 mg by mouth once a week. For Menopausal hot flashes  . FIBER SELECT GUMMIES CHEW Chew 2 tablets by mouth daily.  . furosemide (LASIX) 40 MG tablet Take 1 tablet (40 mg total) by mouth daily.  [name] Hyaluronic Acid-Vitamin C (HYALURONIC ACID PO) Take 1 tablet by mouth daily. Women's Hyaluronic Acid  . LORazepam (ATIVAN) 0.5 MG tablet Take 1 tablet (0.5 mg total) by mouth 2 (two) times daily as needed for anxiety.  . meloxicam (MOBIC) 15 MG tablet Take 1 tablet (15 mg total) by mouth daily.  . Misc Natural Products (TART CHERRY ADVANCED PO) Take 60 mLs by mouth at bedtime.  . Multiple Vitamin (MULTIVITAMIN WITH MINERALS) TABS tablet Take 1 tablet by mouth daily. One-A-Day  . potassium chloride SA (K-DUR,KLOR-CON) 10 MEQ tablet Take 1 tablet (10 mEq total) by mouth daily.  [name]  RESTASIS 0.05 % ophthalmic emulsion Place 1 drop into both eyes 2 (two) times daily.   . rosuvastatin (CRESTOR) 5 MG tablet Take 5 mg by mouth at bedtime.   . TURMERIC CURCUMIN PO Take 1,000 mg by mouth daily.     Allergies:   Atorvastatin; Diclofenac; and Morphine and related   Social History   Social History  . Marital status: Married    Spouse name: N/A  . Number of children: 3  . Years of education: N/A   Occupational History  . Retired    Social History Main Topics  . Smoking status: Former Smoker    Packs/day: 1.00    Years: 20.00    Types: Cigarettes    Quit date: 05/06/1991  . Smokeless tobacco: Never Used  . Alcohol use 4.2 oz/week    7 Glasses of wine per week     Comment: occ  . Drug use: No  . Sexual activity: Yes    Birth control/ protection:  Surgical, Post-menopausal     Comment: partial hysterectomy   Other Topics Concern  . None   Social History Narrative  . None     Family History: The patient's family history includes Healthy in her brother and sister; Hyperlipidemia in her unknown relative; Hypertension in her unknown relative; Prostate cancer in her unknown relative. There is no history of Colon cancer. ROS:   Please see the history of present illness.     All other systems reviewed and are negative.  EKGs/Labs/Other Studies Reviewed:    The following studies were reviewed today:   EKG:   Recent Labs: 05/09/2016: ALT 24 10/26/2016: B Natriuretic Peptide 483.2; TSH 1.050 10/29/2016: Magnesium 2.0 01/26/2017: BUN 23; Creatinine, Ser 0.69; Hemoglobin 13.8; Platelets 274; Potassium 4.0; Sodium 143  Recent Lipid Panel    Component Value Date/Time   CHOL 210 (H) 08/22/2016 0942   TRIG 149.0 08/22/2016 0942   HDL 67.90 08/22/2016 0942   CHOLHDL 3 08/22/2016 0942   VLDL 29.8 08/22/2016 0942   LDLCALC 112 (H) 08/22/2016 0942   LDLDIRECT 137.6 11/05/2010 0935    Physical Exam:    VS:  BP 128/64   Pulse 73   Ht 5\' 2"  (1.575 m)   Wt 172 lb (78 kg)   SpO2 97%   BMI 31.46 kg/m     Wt Readings from Last 3 Encounters:  02/23/17 172 lb (78 kg)  01/27/17 170 lb (77.1 kg)  01/12/17 173 lb (78.5 kg)     GEN:  Well nourished, well developed in no acute distress HEENT: Normal NECK: No JVD; No carotid bruits LYMPHATICS: No lymphadenopathy CARDIAC: RRR, no murmurs, rubs, gallops RESPIRATORY:  Clear to auscultation without rales, wheezing or rhonchi  ABDOMEN: Soft, non-tender, non-distended MUSCULOSKELETAL:  No edema; No deformity  SKIN: Warm and dry NEUROLOGIC:  Alert and oriented x 3 PSYCHIATRIC:  Normal affect   ASSESSMENT:    No diagnosis found. PLAN:    In order of problems listed above:  1. Chronic Systolic CHf  Will increase Entresto to 49-51 BID ,  Will check BMP in 3 weeks .   Office visit in  3 months . Repeat echo if she is stable on the Entresto .   2. Hyperlipidemia:   On Crestor.  Lipids are still slightly elevated but she does not tolerate does any higher than 5 mg a day.    Medication Adjustments/Labs and Tests Ordered: Current medicines are reviewed at length with the patient today.  Concerns regarding  medicines are outlined above.  No orders of the defined types were placed in this encounter.  No orders of the defined types were placed in this encounter.   Signed, Mertie Moores, MD  02/23/2017 10:34 AM    [place]

## 2017-03-13 ENCOUNTER — Telehealth: Payer: Self-pay | Admitting: Nurse Practitioner

## 2017-03-13 NOTE — Telephone Encounter (Signed)
Patient called and states she is currently in [facility] for the next several weeks (she lives in [place] 1/2 time). She states she will work on getting established with a healthcare provider there and will call back to let me know her arrangements. She has a follow-up appointment on 12/17 with [name], PA. I thanked her for calling to give me the update.

## 2017-03-13 NOTE — Telephone Encounter (Signed)
Called patient and left detailed message that lab appointment for bmet is needed next week to check on her since [name] increased her entresto dose at last ov on 10/22. I advised her to call back and ask for me.

## 2017-03-23 ENCOUNTER — Telehealth: Payer: Self-pay | Admitting: Cardiovascular Disease

## 2017-03-23 NOTE — Telephone Encounter (Signed)
Pt lives in [place] during the winter, trying to get established with Dr. Vincent Peyer. They need her records sent in order to make her an appt. Pls fax to [phone] problem call pt on cell at [phone]

## 2017-04-20 ENCOUNTER — Encounter: Payer: Self-pay | Admitting: Physician Assistant

## 2017-04-20 ENCOUNTER — Ambulatory Visit: Payer: PPO | Admitting: Physician Assistant

## 2017-04-20 ENCOUNTER — Encounter (INDEPENDENT_AMBULATORY_CARE_PROVIDER_SITE_OTHER): Payer: Self-pay

## 2017-04-20 VITALS — BP 139/76 | HR 92 | Ht 62.0 in | Wt 174.4 lb

## 2017-04-20 DIAGNOSIS — I428 Other cardiomyopathies: Secondary | ICD-10-CM

## 2017-04-20 DIAGNOSIS — I5022 Chronic systolic (congestive) heart failure: Secondary | ICD-10-CM | POA: Diagnosis not present

## 2017-04-20 DIAGNOSIS — I10 Essential (primary) hypertension: Secondary | ICD-10-CM

## 2017-04-20 DIAGNOSIS — Z79899 Other long term (current) drug therapy: Secondary | ICD-10-CM | POA: Diagnosis not present

## 2017-04-20 DIAGNOSIS — H524 Presbyopia: Secondary | ICD-10-CM | POA: Diagnosis not present

## 2017-04-20 LAB — BASIC METABOLIC PANEL
BUN/Creatinine Ratio: 23 (ref 12–28)
BUN: 21 mg/dL (ref 8–27)
CO2: 27 mmol/L (ref 20–29)
CREATININE: 0.91 mg/dL (ref 0.57–1.00)
Calcium: 9.5 mg/dL (ref 8.7–10.3)
Chloride: 100 mmol/L (ref 96–106)
GFR, EST AFRICAN AMERICAN: 73 mL/min/{1.73_m2} (ref >59)
GFR, EST NON AFRICAN AMERICAN: 64 mL/min/{1.73_m2} (ref >59)
Glucose: 103 mg/dL — ABNORMAL HIGH (ref 65–99)
POTASSIUM: 4.6 mmol/L (ref 3.5–5.2)
SODIUM: 141 mmol/L (ref 134–144)

## 2017-04-20 MED ORDER — CARVEDILOL 12.5 MG PO TABS: 12.5000 mg | ORAL_TABLET | Freq: Two times a day (BID) | ORAL | 4 refills | Status: DC

## 2017-04-20 NOTE — Progress Notes (Signed)
Cardiology Office Note   Date:  04/20/2017   ID:  Lalita, Ebel February 03, 1946, MRN [id number]  PCP:  [name], MD  Cardiologist: [name], 02/23/2017 [name], PA-C 01/12/2017  Chief Complaint  Patient presents with  . Follow-up    pt states no cardiac problems     History of Present Illness: Tina May is a 71 y.o. female with a history of asthma (mold is a trigger), cardiomyopathy (no ischemic testing yet), chronic systolic CHF with an EF of 30-35 percent on 11/19/2016 echo, HTN, HLD,pancreatic lesion (ok now, f/u CT in 12/2017 to confirm).  10/22 office visit, Entresto increased, repeat echo in 3 months, unable to tolerate more than 5 mg of Crestor daily, weight 172 pounds.  Tina May presents for cardiology follow up.   She is watching the salt carefully. She just came back from [place]. She was busy with relatives, did not exercise much. She had her family in for Thanksgiving, enjoyed this.  She has had a dental infection, is to get the bone rebuilt in stages, starting tomorrow. She has been a little down with that and the weather, has gained 2 lbs from sitting around more than usual.   Denies LE edema, orthopnea and PND. She is never out of breath. Does not feel limited by her breathing, no dyspnea on exertion.  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  Never gets chest pain.  She spends several months out of the year in [place], is obtaining physicians down there and wants records sent.  She has filled out a release of information form.   Past Medical History:  Diagnosis Date  . Allergy   . Arthritis   . Asthma   . 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 Cataract    starting  . Diverticulosis   . External hemorrhoids   . Hepatic cyst   . Hyperlipidemia   . Hypertension   . Pancreatic lesion 03/11/13    Past Surgical History:  Procedure Laterality Date  . ABDOMINAL  HYSTERECTOMY    . COLONOSCOPY  08-06-07   per Dr. [name], diverticulae only, repeat in 10 yrs   . LEFT HEART CATH AND CORONARY ANGIOGRAPHY N/A 01/27/2017   Procedure: LEFT HEART CATH AND CORONARY ANGIOGRAPHY;  Surgeon: Martinique, Peter M, MD;  Location: [facility];  Service: Cardiovascular;  Laterality: N/A;  . SMALL INTESTINE SURGERY     from small intestine    Current Outpatient Medications  Medication Sig Dispense Refill  . albuterol (PROVENTIL HFA;VENTOLIN HFA) 108 (90 Base) MCG/ACT inhaler Inhale 2 puffs into the lungs every 4 (four) hours as needed for wheezing or shortness of breath. (Patient taking differently: Inhale 2 puffs into the lungs every 4 (four) hours as needed for wheezing or shortness of breath ((for mold/mildew exposure)). ) 3 Inhaler 3  . amoxicillin (AMOXIL) 875 MG tablet Take 1 tablet by mouth 2 (two) times daily.    [name] aspirin EC 81 MG tablet Take 81 mg by mouth daily.    . carvedilol (COREG) 6.25 MG tablet TAKE 1 TABLET BY MOUTH TWICE A DAY WITH A MEAL 60 tablet 11  . estradiol (ESTRACE) 0.5 MG tablet Take 0.5 mg by mouth once a week. For Menopausal hot flashes    . FIBER SELECT GUMMIES CHEW Chew 2 tablets by mouth daily.    . furosemide (LASIX)  40 MG tablet Take 1 tablet (40 mg total) by mouth daily. 32 tablet 6  . Hyaluronic Acid-Vitamin C (HYALURONIC ACID PO) Take 1 tablet by mouth daily. Women's Hyaluronic Acid    . LORazepam (ATIVAN) 0.5 MG tablet Take 1 tablet (0.5 mg total) by mouth 2 (two) times daily as needed for anxiety. 60 tablet 5  . meloxicam (MOBIC) 15 MG tablet Take 1 tablet (15 mg total) by mouth daily. 90 tablet 3  . Misc Natural Products (TART CHERRY ADVANCED PO) Take 60 mLs by mouth at bedtime.    . Multiple Vitamin (MULTIVITAMIN WITH MINERALS) TABS tablet Take 1 tablet by mouth daily. One-A-Day    . potassium chloride SA (K-DUR,KLOR-CON) 10 MEQ tablet Take 1 tablet (10 mEq total) by mouth daily. 30 tablet 6  . RESTASIS 0.05 % ophthalmic emulsion  Place 1 drop into both eyes 2 (two) times daily.     . rosuvastatin (CRESTOR) 5 MG tablet Take 5 mg by mouth at bedtime.     . sacubitril-valsartan (ENTRESTO) 49-51 MG Take 1 tablet by mouth 2 (two) times daily. 60 tablet 6  . TURMERIC CURCUMIN PO Take 1,000 mg by mouth daily.     No current facility-administered medications for this visit.     Allergies:   Atorvastatin; Diclofenac; and Morphine and related    Social History:  The patient  reports that she quit smoking about 25 years ago. Her smoking use included cigarettes. She has a 20.00 pack-year smoking history. she has never used smokeless tobacco. She reports that she drinks about 4.2 oz of alcohol per week. She reports that she does not use drugs.   Family History:  The patient's family history includes Healthy in her brother and sister; Hyperlipidemia in her unknown relative; Hypertension in her unknown relative; Prostate cancer in her unknown relative.    ROS:  Please see the history of present illness. All other systems are reviewed and negative.    PHYSICAL EXAM: VS:  BP 139/76   Pulse 92   Ht 5\' 2"  (1.575 m)   Wt 174 lb 6.4 oz (79.1 kg)   SpO2 96%   BMI 31.90 kg/m  , BMI Body mass index is 31.9 kg/m. GEN: Well nourished, well developed, female in no acute distress  HEENT: normal for age  Neck: no JVD, no carotid bruit, no masses Cardiac: RRR; no murmur, no rubs, or gallops Respiratory:  clear to auscultation bilaterally, normal work of breathing GI: soft, nontender, nondistended, + BS MS: no deformity or atrophy; no edema; distal pulses are 2+ in all 4 extremities   Skin: warm and dry, no rash Neuro:  Strength and sensation are intact Psych: euthymic mood, full affect   EKG:  EKG is not ordered today.  Recent Labs: 05/09/2016: ALT 24 10/26/2016: B Natriuretic Peptide 483.2; TSH 1.050 10/29/2016: Magnesium 2.0 01/26/2017: BUN 23; Creatinine, Ser 0.69; Hemoglobin 13.8; Platelets 274; Potassium 4.0; Sodium 143     Lipid Panel    Component Value Date/Time   CHOL 210 (H) 08/22/2016 0942   TRIG 149.0 08/22/2016 0942   HDL 67.90 08/22/2016 0942   CHOLHDL 3 08/22/2016 0942   VLDL 29.8 08/22/2016 0942   LDLCALC 112 (H) 08/22/2016 0942   LDLDIRECT 137.6 11/05/2010 0935     Wt Readings from Last 3 Encounters:  04/20/17 174 lb 6.4 oz (79.1 kg)  02/23/17 172 lb (78 kg)  01/27/17 170 lb (77.1 kg)     Other studies Reviewed: Additional studies/ records that  were reviewed today include: Office notes, hospital records and testing.  ASSESSMENT AND PLAN:  1.  Chronic systolic CHF: Her weight has trended up slightly, but her volume status has not changed.  She is on good medical therapy with beta-blocker, Lasix, potassium and Entresto.  She is compliant with a low-sodium diet.  Recheck BMET today.  2. Hypertension: Her blood pressure is above target today.  Her heart rate is also elevated.  I will increase the carvedilol to 12.5 mg twice daily continue other meds at current doses.  3.  Nonischemic cardiomyopathy: She is to continue current therapy with diuretic and Entresto and beta-blocker.  Recheck echocardiogram prior to follow-up with [name].  This will be after she gets back from [place] in late April or early May.  If her EF is unchanged, consider defibrillator.  At this time, she is not interested in a LifeVest.   Current medicines are reviewed at length with the patient today.  The patient does not have concerns regarding medicines.  The following changes have been made: Increase carvedilol  Labs/ tests ordered today include:   Orders Placed This Encounter  Procedures  . Basic metabolic panel     Disposition:   FU with [name]  Signed, [name], PA-C  04/20/2017 4:26 PM    [place] Phone: [phone]; Fax: [phone]  This note was written with the assistance of speech recognition software. Please excuse any transcriptional  errors.

## 2017-04-20 NOTE — Patient Instructions (Signed)
Medication Instructions:  INCREASE CARVEDILOL 12.5MG  TWICE DAILY  If you need a refill on your cardiac medications before your next appointment, please call your pharmacy.  Labwork: BMET TODAY HERE IN OUR OFFICE AT [facility]  Testing/Procedures: WILL PLAN TO DISCUSS ECHO AT MAY FOLLOW UP APPOINTMENT  Follow-Up: Your physician wants you to follow-up in: [place].   Thank you for choosing [facility] at [facility]!!

## 2017-04-22 ENCOUNTER — Telehealth: Payer: Self-pay | Admitting: Physician Assistant

## 2017-04-22 NOTE — Telephone Encounter (Signed)
Returned call to patient lab results given.Advised to decrease kdur to 10 meq to every other day.

## 2017-04-22 NOTE — Telephone Encounter (Signed)
F/u Message  Pt returning RN call about lab results. Please call back to discuss

## 2017-04-24 ENCOUNTER — Ambulatory Visit: Payer: Self-pay | Admitting: *Deleted

## 2017-04-24 ENCOUNTER — Other Ambulatory Visit: Payer: Self-pay | Admitting: Family Medicine

## 2017-04-24 NOTE — Telephone Encounter (Signed)
Pt  Denies   Any  Chest  Pain  Or  Shortness  Of  Breath   Pt  Has  Had  Cough  Started  Last  Pm  Actually  Is  Better     Today  . Has  Not  Tried  Any otc  meds . The   Pt   Reports  The  Cough  Is   Non  Productive  ,  She   Was  Encouraged   To  Drink lots  Of fluids    Call 911  If  Any  Chest pain or  Shortness  Of  Breath . She has   An appt in 4v  Days  With  Dr  Sarajane Jews     Reason for Disposition . Cough  Answer Assessment - Initial Assessment Questions 1. ONSET: "When did the cough begin?"      LAST  NIGHT 2. SEVERITY: "How bad is the cough today?"        NOT  AS  BAD  AS  LAST  NIGHT BUT STILL   HAVING  COMPUTERS   3. RESPIRATORY DISTRESS: "Describe your breathing."         Fine 4. FEVER: "Do you have a fever?" If so, ask: "What is your temperature, how was it measured, and when did it start?"         No 5. HEMOPTYSIS: "Are you coughing up any blood?" If so ask: "How much?" (flecks, streaks, tablespoons, etc.)         No 6. TREATMENT: "What have you done so far to treat the cough?" (e.g., meds, fluids, humidifier)         THHROAT  LOZANGES  FLONASE   7. CARDIAC HISTORY: "Do you have any history of heart disease?" (e.g., heart attack, congestive heart failure)         chf  8. LUNG HISTORY: "Do you have any history of lung disease?"  (e.g., pulmonary embolus, asthma, emphysema)    Asthma    -   Mold  Induce   9. PE RISK FACTORS: "Do you have a history of blood clots?" (or: recent major surgery, recent prolonged travel, bedridden )      No 10. OTHER SYMPTOMS: "Do you have any other symptoms? (e.g., runny nose, wheezing, chest pain)       Scratchy  Prickly   Throat   11. PREGNANCY: "Is there any chance you are pregnant?" "When was your last menstrual period?"       N/a 12. TRAVEL: "Have you traveled out of the country in the last month?" (e.g., travel history, exposures)        No  Protocols used: COUGH - ACUTE NON-PRODUCTIVE-A-AH

## 2017-04-24 NOTE — Telephone Encounter (Signed)
PT  CALLED  BACK   TRIAGE  ENCOUNTER  CREATED

## 2017-04-24 NOTE — Telephone Encounter (Signed)
Left message for pt. To return call to discuss request for cough medicine and possible symptoms.

## 2017-04-24 NOTE — Telephone Encounter (Signed)
Copied from [place] [id number]. Topic: Inquiry >> Apr 24, 2017 10:49 AM Cecelia Byars, NT wrote: Reason for CRM: Patient requesting cough medicine called at  [facility] in [place], she has an appointment on Wednesday.

## 2017-04-29 ENCOUNTER — Encounter: Payer: Self-pay | Admitting: Family Medicine

## 2017-04-29 ENCOUNTER — Ambulatory Visit: Payer: PPO | Admitting: Family Medicine

## 2017-04-29 VITALS — BP 128/86 | HR 76 | Temp 98.1°F | Wt 175.0 lb

## 2017-04-29 DIAGNOSIS — J329 Chronic sinusitis, unspecified: Secondary | ICD-10-CM

## 2017-04-29 MED ORDER — HYDROCODONE-HOMATROPINE 5-1.5 MG/5ML PO SYRP: 5.0000 mL | ORAL_SOLUTION | ORAL | 0 refills | Status: DC | PRN

## 2017-04-29 MED ORDER — LEVOFLOXACIN 500 MG PO TABS: 500.0000 mg | ORAL_TABLET | Freq: Every day | ORAL | 0 refills | Status: AC

## 2017-04-29 NOTE — Progress Notes (Signed)
   Subjective:    Patient ID: Tina May, female    DOB: 17-Mar-1946, 71 y.o.   MRN: [id number]  HPI Here for one month of recurrent sinus pressure, PND, and a dry hard cough. She has had several rounds of Augmentin in the past month for a dental infection, but this has not affected the cough. She and I both feel she has a chronic sinus infection that has been difficult to treat. She plans to leave for [place] in several days, since she always spends 4 months there every winter. She asks about seeing an infection specialist at the [facility] in [place] while she is there.    Review of Systems  Constitutional: Negative.   HENT: Positive for congestion, postnasal drip, sinus pressure and sore throat. Negative for sinus pain.   Eyes: Negative.   Respiratory: Positive for cough and chest tightness. Negative for shortness of breath and wheezing.   Cardiovascular: Negative.        Objective:   Physical Exam  Constitutional: She appears well-developed and well-nourished.  HENT:  Right Ear: External ear normal.  Left Ear: External ear normal.  Nose: Nose normal.  Mouth/Throat: Oropharynx is clear and moist.  Eyes: Conjunctivae are normal.  Neck: No thyromegaly present.  Cardiovascular: Normal rate, regular rhythm, normal heart sounds and intact distal pulses.  Pulmonary/Chest: Effort normal. No respiratory distress. She has no rales.  Soft wheezes   Musculoskeletal: She exhibits no edema.  Lymphadenopathy:    She has no cervical adenopathy.          Assessment & Plan:  She has been dealing with a chronic sinusitis. We will treat with Levaquin for 10 days. Per her request we will also arrange a referral to see Infectious Disease at the [facility] next month. [name], MD

## 2017-05-01 ENCOUNTER — Telehealth: Payer: Self-pay | Admitting: Family Medicine

## 2017-05-01 DIAGNOSIS — J328 Other chronic sinusitis: Secondary | ICD-10-CM

## 2017-05-01 NOTE — Telephone Encounter (Signed)
Copied from [place] [id number]. Topic: Quick Communication - See Telephone Encounter >> May 01, 2017 10:21 AM Cleaster Corin, NT wrote: CRM for notification. See Telephone encounter for:   05/01/17. Therella called from [facility] and stated that they do not provide the services for chronic sinusitis in infection disease dept. but if the pt. Has medicare advantage PPO only she will be able to use the referral for the ENT department.  Therella can be reached at [phone]

## 2017-05-06 NOTE — Telephone Encounter (Signed)
Sent to PCP ?

## 2017-05-07 NOTE — Telephone Encounter (Signed)
Ask the patient if would like a referral to [facility] instead

## 2017-05-07 NOTE — Telephone Encounter (Signed)
Called and spoke with pt. Pt would like the referral to be placed at [facility] pt does not have a preference female or female. Sent to PCP

## 2017-05-07 NOTE — Telephone Encounter (Signed)
The referral was done  

## 2017-05-07 NOTE — Telephone Encounter (Signed)
Called pt and left a VM that referral has been placed.  

## 2017-05-21 ENCOUNTER — Telehealth: Payer: Self-pay | Admitting: Family Medicine

## 2017-05-21 DIAGNOSIS — J328 Other chronic sinusitis: Secondary | ICD-10-CM

## 2017-05-21 NOTE — Telephone Encounter (Signed)
Copied from [place] [id number]. Topic: Quick Communication - See Telephone Encounter >> May 01, 2017 10:21 AM Cleaster Corin, NT wrote: CRM for notification. See Telephone encounter for:   05/01/17. Therella called from [facility] and stated that they do not provide the services for chronic sinusitis in infection disease dept. but if the pt. Has medicare advantage PPO only she will be able to use the referral for the ENT department.  Lindwood Coke can be reached at [phone] >> May 21, 2017  2:45 PM Boyd Kerbs wrote: [facility] [phone] says they did get a referral for her but they have to have her go to ENT first. (no medical ENT on staff.)   will need to be referred to ENT. In [place].

## 2017-05-22 NOTE — Telephone Encounter (Signed)
Sent to PCP to set up referral for pt to see ENT

## 2017-05-22 NOTE — Telephone Encounter (Signed)
The new referral is done

## 2017-05-25 DIAGNOSIS — J343 Hypertrophy of nasal turbinates: Secondary | ICD-10-CM | POA: Diagnosis not present

## 2017-05-25 DIAGNOSIS — J328 Other chronic sinusitis: Secondary | ICD-10-CM | POA: Diagnosis not present

## 2017-05-25 DIAGNOSIS — J320 Chronic maxillary sinusitis: Secondary | ICD-10-CM | POA: Diagnosis not present

## 2017-05-25 DIAGNOSIS — R490 Dysphonia: Secondary | ICD-10-CM | POA: Diagnosis not present

## 2017-05-25 DIAGNOSIS — R51 Headache: Secondary | ICD-10-CM | POA: Diagnosis not present

## 2017-05-25 DIAGNOSIS — R0982 Postnasal drip: Secondary | ICD-10-CM | POA: Diagnosis not present

## 2017-05-25 NOTE — Telephone Encounter (Signed)
Called pt and left a detailed VM that referral has been set up.

## 2017-05-28 DIAGNOSIS — J329 Chronic sinusitis, unspecified: Secondary | ICD-10-CM | POA: Diagnosis not present

## 2017-06-01 DIAGNOSIS — J328 Other chronic sinusitis: Secondary | ICD-10-CM | POA: Diagnosis not present

## 2017-06-01 DIAGNOSIS — R0982 Postnasal drip: Secondary | ICD-10-CM | POA: Diagnosis not present

## 2017-06-01 DIAGNOSIS — R51 Headache: Secondary | ICD-10-CM | POA: Diagnosis not present

## 2017-06-01 DIAGNOSIS — J343 Hypertrophy of nasal turbinates: Secondary | ICD-10-CM | POA: Diagnosis not present

## 2017-07-09 ENCOUNTER — Other Ambulatory Visit: Payer: Self-pay | Admitting: *Deleted

## 2017-07-09 MED ORDER — POTASSIUM CHLORIDE ER 10 MEQ PO TBCR: EXTENDED_RELEASE_TABLET | ORAL | 6 refills | Status: DC

## 2017-07-13 ENCOUNTER — Other Ambulatory Visit: Payer: Self-pay

## 2017-07-13 ENCOUNTER — Other Ambulatory Visit: Payer: Self-pay | Admitting: *Deleted

## 2017-07-13 MED ORDER — FUROSEMIDE 40 MG PO TABS: 40.0000 mg | ORAL_TABLET | Freq: Every day | ORAL | 6 refills | Status: DC

## 2017-07-13 NOTE — Telephone Encounter (Signed)
Rx has been sent to the pharmacy electronically. ° °

## 2017-07-20 ENCOUNTER — Other Ambulatory Visit: Payer: Self-pay

## 2017-07-20 MED ORDER — POTASSIUM CHLORIDE ER 10 MEQ PO TBCR: EXTENDED_RELEASE_TABLET | ORAL | 6 refills | Status: DC

## 2017-07-23 ENCOUNTER — Other Ambulatory Visit: Payer: Self-pay | Admitting: *Deleted

## 2017-07-23 NOTE — Telephone Encounter (Signed)
encounter error

## 2017-07-24 ENCOUNTER — Other Ambulatory Visit: Payer: Self-pay | Admitting: *Deleted

## 2017-07-24 MED ORDER — CARVEDILOL 12.5 MG PO TABS: 12.5000 mg | ORAL_TABLET | Freq: Two times a day (BID) | ORAL | 2 refills | Status: DC

## 2017-08-19 ENCOUNTER — Other Ambulatory Visit: Payer: Self-pay | Admitting: Physician Assistant

## 2017-08-25 ENCOUNTER — Encounter: Payer: Self-pay | Admitting: Cardiology

## 2017-08-25 ENCOUNTER — Other Ambulatory Visit: Payer: Self-pay | Admitting: *Deleted

## 2017-08-25 ENCOUNTER — Ambulatory Visit: Payer: PPO | Admitting: Cardiology

## 2017-08-25 ENCOUNTER — Telehealth: Payer: Self-pay

## 2017-08-25 VITALS — BP 134/80 | HR 83 | Ht 62.0 in | Wt 177.8 lb

## 2017-08-25 DIAGNOSIS — E785 Hyperlipidemia, unspecified: Secondary | ICD-10-CM

## 2017-08-25 DIAGNOSIS — I509 Heart failure, unspecified: Secondary | ICD-10-CM

## 2017-08-25 DIAGNOSIS — I428 Other cardiomyopathies: Secondary | ICD-10-CM | POA: Diagnosis not present

## 2017-08-25 DIAGNOSIS — I10 Essential (primary) hypertension: Secondary | ICD-10-CM

## 2017-08-25 DIAGNOSIS — I5021 Acute systolic (congestive) heart failure: Secondary | ICD-10-CM

## 2017-08-25 DIAGNOSIS — R0602 Shortness of breath: Secondary | ICD-10-CM

## 2017-08-25 LAB — BASIC METABOLIC PANEL
BUN / CREAT RATIO: 27 (ref 12–28)
BUN: 23 mg/dL (ref 8–27)
CHLORIDE: 100 mmol/L (ref 96–106)
CO2: 28 mmol/L (ref 20–29)
Calcium: 9.8 mg/dL (ref 8.7–10.3)
Creatinine, Ser: 0.85 mg/dL (ref 0.57–1.00)
GFR calc non Af Amer: 69 mL/min/{1.73_m2} (ref >59)
GFR, EST AFRICAN AMERICAN: 79 mL/min/{1.73_m2} (ref >59)
GLUCOSE: 110 mg/dL — AB (ref 65–99)
POTASSIUM: 4.3 mmol/L (ref 3.5–5.2)
SODIUM: 145 mmol/L — AB (ref 134–144)

## 2017-08-25 LAB — PRO B NATRIURETIC PEPTIDE: NT-Pro BNP: 388 pg/mL — ABNORMAL HIGH (ref 0–301)

## 2017-08-25 NOTE — Telephone Encounter (Signed)
-----   Message from Daune Perch, NP sent at 08/25/2017  4:42 PM EDT ----- Please let the pt know that her labs showed normal renal function and potassium but her BNP was mildly elevated indicating some fluid overload. Please have her increase her lasix to twice daily for 3 days. If she is still not back to baseline can take extra dose for 2 more days. (total of 5 days).  Recheck BMet in 7 days.   Daune Perch, NP

## 2017-08-25 NOTE — Patient Instructions (Addendum)
Medication Instructions: Your physician recommends that you continue on your current medications as directed. Please refer to the Current Medication list given to you today.   Labwork: Today : BMET & BNP  Procedures/Testing: Your physician has requested that you have an echocardiogram. Echocardiography is a painless test that uses sound waves to create images of your heart. It provides your doctor with information about the size and shape of your heart and how well your heart's chambers and valves are working. This procedure takes approximately one hour. There are no restrictions for this procedure.    Follow-Up: Your physician recommends that you schedule a follow-up appointment in: 1 month with Dr.Nasher or [name] PA-C    Any Additional Special Instructions Will Be Listed Below (If Applicable).  Echocardiogram An echocardiogram, or echocardiography, uses sound waves (ultrasound) to produce an image of your heart. The echocardiogram is simple, painless, obtained within a short period of time, and offers valuable information to your health care provider. The images from an echocardiogram can provide information such as:  Evidence of coronary artery disease (CAD).  Heart size.  Heart muscle function.  Heart valve function.  Aneurysm detection.  Evidence of a past heart attack.  Fluid buildup around the heart.  Heart muscle thickening.  Assess heart valve function.  Tell a health care provider about:  Any allergies you have.  All medicines you are taking, including vitamins, herbs, eye drops, creams, and over-the-counter medicines.  Any problems you or family members have had with anesthetic medicines.  Any blood disorders you have.  Any surgeries you have had.  Any medical conditions you have.  Whether you are pregnant or may be pregnant. What happens before the procedure? No special preparation is needed. Eat and drink normally. What happens during the  procedure?  In order to produce an image of your heart, gel will be applied to your chest and a wand-like tool (transducer) will be moved over your chest. The gel will help transmit the sound waves from the transducer. The sound waves will harmlessly bounce off your heart to allow the heart images to be captured in real-time motion. These images will then be recorded.  You may need an IV to receive a medicine that improves the quality of the pictures. What happens after the procedure? You may return to your normal schedule including diet, activities, and medicines, unless your health care provider tells you otherwise. This information is not intended to replace advice given to you by your health care provider. Make sure you discuss any questions you have with your health care provider. Document Released: 04/18/2000 Document Revised: 12/08/2015 Document Reviewed: 12/27/2012 Elsevier Interactive Patient Education  2017 [employer].    If you need a refill on your cardiac medications before your next appointment, please call your pharmacy.

## 2017-08-25 NOTE — Progress Notes (Addendum)
Cardiology Office Note:    Date:  08/25/2017   ID:  Tina May, Tina May, MRN [id number]  PCP:  Tina Morale, MD  Cardiologist:  Tina Moores, MD  Referring MD: Tina Morale, MD   Chief Complaint  Patient presents with  . Congestive Heart Failure    History of Present Illness:    BETSIE May is a 72 y.o. female with a past medical history significant for asthma (mold is a trigger), cardiomyopathy (no significant CAD by cath), chronic systolic CHF with EF 32-35% per 11/2016 echo, hypertension, hyperlipidemia, pancreatic lesion (okay now, F/U CT in 12/2017 to confirm).   Tina May was last seen in the office on 04/20/2017 by Tina Ferries, PA.  It was noted that her Delene Loll had been increased in October.  She is planned for a repeat echocardiogram to reevaluate LV function.  There is not improvement, she will likely need referral to EP for ICD.  She has been managed with Lasix 40 mg daily, carvedilol 12.5 mg twice daily, Entresto and statin.  The patient spends several months out of the year in [place] and has recently returned to [place]. She did not see a cardiologist in [place]. She usually plays golf and walks for miles on the beach. For the last several weeks she has noted a decrease in her activity tolerance. No chest discomfort or significant shortness of breath, but just has not felt right. She has been treated for sinusistis for about a year and has been on mult abx. She went to ENT and was found to have a resistent infection. She was placed on doxycycline which caused blisters on hands and feet, abd pain and nausea, no diarrhea. She improved as far as sinuses. She was to have a procedure to remove polyps but it was canceled due to insurance not approved.   Yesterday she contacted Tina May for her complaints and was worked in to be seen today. Has very mild DOE with more intense exertion and walking up 1 flight of stairs. She denies orthopnea, PND or  significant edema. She has occ mild pedal edema at the end that resolves with elevation of legs. She is compliant with all of her meds. She has not been weighing lately as she felt that she was doing well.    Past Medical History:  Diagnosis Date  . Allergy   . Arthritis   . Asthma   . 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 Cataract    starting  . Diverticulosis   . External hemorrhoids   . Hepatic cyst   . Hyperlipidemia   . Hypertension   . Pancreatic lesion 03/11/13    Past Surgical History:  Procedure Laterality Date  . ABDOMINAL HYSTERECTOMY    . COLONOSCOPY  08-06-07   per Dr. Olevia May, diverticulae only, repeat in 10 yrs   . LEFT HEART CATH AND CORONARY ANGIOGRAPHY N/A 01/27/2017   Procedure: LEFT HEART CATH AND CORONARY ANGIOGRAPHY;  Surgeon: Martinique, Peter M, MD;  Location: [facility];  Service: Cardiovascular;  Laterality: N/A;  . SMALL INTESTINE SURGERY     from small intestine    Current Medications: Current Meds  Medication Sig  . albuterol (PROVENTIL HFA;VENTOLIN HFA) 108 (90 Base) MCG/ACT inhaler Inhale 2 puffs into the lungs every 4 (four) hours as needed for wheezing or shortness of breath. (Patient taking differently: Inhale 2 puffs into the lungs every 4 (four) hours as needed for wheezing or shortness  of breath ((for mold/mildew exposure)). )  . aspirin EC 81 MG tablet Take 81 mg by mouth daily.  . carvedilol (COREG) 12.5 MG tablet Take 1 tablet (12.5 mg total) by mouth 2 (two) times daily with a meal.  . estradiol (ESTRACE) 0.5 MG tablet Take 0.5 mg by mouth once a week. For Menopausal hot flashes  . FIBER SELECT GUMMIES CHEW Chew 2 tablets by mouth daily.  . furosemide (LASIX) 40 MG tablet Take 1 tablet (40 mg total) by mouth daily.  [name] Hyaluronic Acid-Vitamin C (HYALURONIC ACID PO) Take 1 tablet by mouth daily. Women's Hyaluronic Acid  . HYDROcodone-homatropine (HYDROMET) 5-1.5 MG/5ML syrup Take 5 mLs by mouth every 4 (four) hours as needed.  [name] LORazepam  (ATIVAN) 0.5 MG tablet Take 1 tablet (0.5 mg total) by mouth 2 (two) times daily as needed for anxiety.  . meloxicam (MOBIC) 15 MG tablet Take 1 tablet (15 mg total) by mouth daily.  . Misc Natural Products (TART CHERRY ADVANCED PO) Take 60 mLs by mouth at bedtime.  . Multiple Vitamin (MULTIVITAMIN WITH MINERALS) TABS tablet Take 1 tablet by mouth daily. One-A-Day  . potassium chloride (K-DUR) 10 MEQ tablet Take 10 meq every other day  . potassium chloride (K-DUR,KLOR-CON) 10 MEQ tablet TAKE 1 TABLET BY MOUTH EVERY DAY  . RESTASIS 0.05 % ophthalmic emulsion Place 1 drop into both eyes 2 (two) times daily.   . rosuvastatin (CRESTOR) 5 MG tablet Take 5 mg by mouth at bedtime.   . sacubitril-valsartan (ENTRESTO) 49-51 MG Take 1 tablet by mouth 2 (two) times daily.  . TURMERIC CURCUMIN PO Take 1,000 mg by mouth daily.     Allergies:   Atorvastatin; Diclofenac; and Morphine and related   Social History   Socioeconomic History  . Marital status: Married    Spouse name: Not on file  . Number of children: 3  . Years of education: Not on file  . Highest education level: Not on file  Occupational History  . Occupation: Retired  Scientific laboratory technician  . Financial resource strain: Not on file  . Food insecurity:    Worry: Not on file    Inability: Not on file  . Transportation needs:    Medical: Not on file    Non-medical: Not on file  Tobacco Use  . Smoking status: Former Smoker    Packs/day: 1.00    Years: 20.00    Pack years: 20.00    Types: Cigarettes    Last attempt to quit: 05/06/1991    Years since quitting: 26.3  . Smokeless tobacco: Never Used  Substance and Sexual Activity  . Alcohol use: Yes    Alcohol/week: 4.2 oz    Types: 7 Glasses of wine per week    Comment: occ  . Drug use: No  . Sexual activity: Yes    Birth control/protection: Surgical, Post-menopausal    Comment: partial hysterectomy  Lifestyle  . Physical activity:    Days per week: Not on file    Minutes per  session: Not on file  . Stress: Not on file  Relationships  . Social connections:    Talks on phone: Not on file    Gets together: Not on file    Attends religious service: Not on file    Active member of club or organization: Not on file    Attends meetings of clubs or organizations: Not on file    Relationship status: Not on file  Other Topics Concern  . Not  on file  Social History Narrative  . Not on file     Family History: The patient's family history includes Healthy in her brother and sister; Hyperlipidemia in her unknown relative; Hypertension in her unknown relative; Prostate cancer in her unknown relative. There is no history of Colon cancer. ROS:   Please see the history of present illness.     All other systems reviewed and are negative.  EKGs/Labs/Other Studies Reviewed:    The following studies were reviewed today:  LEFT HEART CATH AND CORONARY ANGIOGRAPHY  01/27/17  Conclusion    Prox RCA to Mid RCA lesion, 10 %stenosed.  There is mild left ventricular systolic dysfunction.  LV end diastolic pressure is normal.   1. No significant CAD 2. Mild LV dysfunction. EF is estimated at 45% 3. Normal LVEDP  Plan: continue medical therapy.    Stress Echocardiogram 12/22/16 Study Conclusions - Stress ECG conclusions: There were no stress arrhythmias or   conduction abnormalities. The stress ECG was normal.  Impressions: - Left ventricular systolic function did not improve from baseline,   suggesting poor myocardial reserve.   No regional wall motion abnormalities and no ischemic ECG changes   were seen following exercise.  Recommendations:  No evidence of stress induced ischemia, but myocardial reserve is poor. 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 Absence of myocardial recruitment with exercise may be a sign of worse prognosis.  Echocardiogram 11/19/16 Study Conclusions - Left ventricle: The cavity size was normal. There was  mild   concentric hypertrophy. Systolic function was moderately to   severely reduced. The estimated ejection fraction was in the   range of 30% to 35%. Diffuse hypokinesis. Doppler parameters are   consistent with abnormal left ventricular relaxation (grade 1   diastolic dysfunction). - Aortic valve: There was mild regurgitation. - Mitral valve: Calcified annulus. There was mild regurgitation. - Tricuspid valve: There was mild regurgitation. - Pericardium, extracardiac: A small, mostly posterior pericardial   effusion was identified circumferential to the heart.  Impressions: - There has been mild improvement in EF since prior study.  Echo 10/27/16:  EF 25-30%, diffuse hypokinesis, mild AR, mild-mod MR, biatrial dilitation, mod TR, PA peak pressure 54 mm Hg   EKG:  EKG is  ordered today.  The ekg ordered today demonstrates NSR 83 bpm, no significant changes  Recent Labs: 10/26/2016: B Natriuretic Peptide 483.2; TSH 1.050 10/29/2016: Magnesium 2.0 01/26/2017: Hemoglobin 13.8; Platelets 274 08/25/2017: BUN 23; Creatinine, Ser 0.85; NT-Pro BNP 388; Potassium 4.3; Sodium 145   Recent Lipid Panel    Component Value Date/Time   CHOL 210 (H) 08/22/2016 0942   TRIG 149.0 08/22/2016 0942   HDL 67.90 08/22/2016 0942   CHOLHDL 3 08/22/2016 0942   VLDL 29.8 08/22/2016 0942   LDLCALC 112 (H) 08/22/2016 0942   LDLDIRECT 137.6 11/05/2010 0935    Physical Exam:    VS:  BP 134/80   Pulse 83   Ht 5\' 2"  (1.575 May)   Wt 177 lb 12.8 oz (80.6 kg)   SpO2 93%   BMI 32.52 kg/May     Wt Readings from Last 3 Encounters:  08/25/17 177 lb 12.8 oz (80.6 kg)  04/29/17 175 lb (79.4 kg)  04/20/17 174 lb 6.4 oz (79.1 kg)     Physical Exam  Constitutional: She is oriented to person, place, and time. She appears well-developed and well-nourished.  HENT:  Head: Normocephalic and atraumatic.  Neck: Normal range of motion. Neck supple. No JVD present.  Cardiovascular: Normal rate, regular rhythm,  normal heart sounds and intact distal pulses. Exam reveals no gallop and no friction rub.  No murmur heard. Pulmonary/Chest: Effort normal and breath sounds normal. No respiratory distress. She has no wheezes. She has no rales.  Abdominal: Soft. Bowel sounds are normal. She exhibits no distension.  Musculoskeletal: Normal range of motion. She exhibits no edema or deformity.  Neurological: She is alert and oriented to person, place, and time.  Skin: Skin is warm and dry.  Psychiatric: She has a normal mood and affect. Her behavior is normal. Thought content normal.     ASSESSMENT:    1. Acute systolic congestive heart failure ([place])   2. Benign essential HTN   3. Hyperlipidemia, unspecified hyperlipidemia type   4. Acute congestive heart failure, unspecified heart failure type ([place])   5. SOB (shortness of breath)   6. Non-ischemic cardiomyopathy ([facility])    PLAN:    In order of problems listed above:  Acute on chronic systolic CHF:  For the last several weeks she has noted a decrease in her activity tolerance. No chest discomfort or significant shortness of breath, but just has not felt right. No orthopnea, PND or significant edema. Does not appear significantly volume overloaded but could be the beginnings of fluid accumulation. Will assess BMet and BNP.  Nonischemic cardiomyopathy: EF 30-35% by echo in 11/2016.[name] No CAD by cardiac cath in 01/2017.  Currently on guideline directed medical therapy with Entresto 49-51 mg bid, carvedilol 12.5 mg bid, lasix 40 mg daily. Will recheck echo to reassess LV function since being on optimal medical therapy. Will increase Entresto as BP and labs are OK. Will have follow up in 2 weeks to assess tolerance of increased Entresto and can consider addition of spironolactone.   Hypertension: BP well controlled on current medical therapy.   Hyperlipidemia: On Crestor 5 mg. LDL 112 in 08/2016.    Medication Adjustments/Labs and Tests Ordered: Current medicines  are reviewed at length with the patient today.  Concerns regarding medicines are outlined above. Labs and tests ordered and medication changes are outlined in the patient instructions below:  Patient Instructions  Medication Instructions: Your physician recommends that you continue on your current medications as directed. Please refer to the Current Medication list given to you today.   Labwork: Today : BMET & BNP  Procedures/Testing: Your physician has requested that you have an echocardiogram. Echocardiography is a painless test that uses sound waves to create images of your heart. It provides your doctor with information about the size and shape of your heart and how well your heart's chambers and valves are working. This procedure takes approximately one hour. There are no restrictions for this procedure.    Follow-Up: Your physician recommends that you schedule a follow-up appointment in: 1 month with Dr.Nasher or Tina Ferries PA-C    Any Additional Special Instructions Will Be Listed Below (If Applicable).  Echocardiogram An echocardiogram, or echocardiography, uses sound waves (ultrasound) to produce an image of your heart. The echocardiogram is simple, painless, obtained within a short period of time, and offers valuable information to your health care provider. The images from an echocardiogram can provide information such as:  Evidence of coronary artery disease (CAD).  Heart size.  Heart muscle function.  Heart valve function.  Aneurysm detection.  Evidence of a past heart attack.  Fluid buildup around the heart.  Heart muscle thickening.  Assess heart valve function.  Tell a health care provider about:  Any  allergies you have.  All medicines you are taking, including vitamins, herbs, eye drops, creams, and over-the-counter medicines.  Any problems you or family members have had with anesthetic medicines.  Any blood disorders you have.  Any surgeries  you have had.  Any medical conditions you have.  Whether you are pregnant or may be pregnant. What happens before the procedure? No special preparation is needed. Eat and drink normally. What happens during the procedure?  In order to produce an image of your heart, gel will be applied to your chest and a wand-like tool (transducer) will be moved over your chest. The gel will help transmit the sound waves from the transducer. The sound waves will harmlessly bounce off your heart to allow the heart images to be captured in real-time motion. These images will then be recorded.  You may need an IV to receive a medicine that improves the quality of the pictures. What happens after the procedure? You may return to your normal schedule including diet, activities, and medicines, unless your health care provider tells you otherwise. This information is not intended to replace advice given to you by your health care provider. Make sure you discuss any questions you have with your health care provider. Document Released: 04/18/2000 Document Revised: 12/08/2015 Document Reviewed: 12/27/2012 Elsevier Interactive Patient Education  2017 [employer].    If you need a refill on your cardiac medications before your next appointment, please call your pharmacy.      Signed, Daune Perch, NP  08/25/2017 5:58 PM    [facility]

## 2017-08-25 NOTE — Telephone Encounter (Signed)
Left message for pt to call back  °

## 2017-08-26 ENCOUNTER — Other Ambulatory Visit: Payer: Self-pay | Admitting: Cardiology

## 2017-08-26 ENCOUNTER — Telehealth: Payer: Self-pay | Admitting: Cardiology

## 2017-08-26 ENCOUNTER — Other Ambulatory Visit: Payer: Self-pay | Admitting: *Deleted

## 2017-08-26 MED ORDER — SACUBITRIL-VALSARTAN 97-103 MG PO TABS: 1.0000 | ORAL_TABLET | Freq: Two times a day (BID) | ORAL | Status: DC

## 2017-08-26 MED ORDER — SACUBITRIL-VALSARTAN 97-103 MG PO TABS: 1.0000 | ORAL_TABLET | Freq: Two times a day (BID) | ORAL | 0 refills | Status: DC

## 2017-08-26 NOTE — Telephone Encounter (Signed)
I have called patient and set up an appt 5/13 @ 9:45

## 2017-08-26 NOTE — Telephone Encounter (Signed)
Patient called and stated that her entresto dose was changed but the pharmacy never received the rx. The order was placed as a clinic administered medication. I made the patient aware that I would get this sent to the pharmacy for her. She verbalized understanding and appreciation.

## 2017-08-26 NOTE — Telephone Encounter (Signed)
I have called to discuss lab results with Ms. Chenoweth. Her case was discussed with [name].  Her labs look OK with BNP only mildly elevated. I do not think that she is in need of increased diuresis at this time. We will increase her Entresto to 97-103 mg bid (order placed) and see her back in 2 weeks. If she is stable at that time on the increased Entresto will consider adding spironolactone. Will defer echo until after medical therapy optimized. She verbalizes understanding of this plan and is appreciative of the inclusion in her care plan.   Daune Perch, AGNP-C [facility] 08/26/2017  10:03 AM

## 2017-08-27 ENCOUNTER — Encounter: Payer: Self-pay | Admitting: Cardiology

## 2017-08-28 ENCOUNTER — Other Ambulatory Visit (HOSPITAL_COMMUNITY): Payer: PPO

## 2017-08-31 DIAGNOSIS — Z1231 Encounter for screening mammogram for malignant neoplasm of breast: Secondary | ICD-10-CM | POA: Diagnosis not present

## 2017-08-31 DIAGNOSIS — N951 Menopausal and female climacteric states: Secondary | ICD-10-CM | POA: Diagnosis not present

## 2017-08-31 DIAGNOSIS — Z01411 Encounter for gynecological examination (general) (routine) with abnormal findings: Secondary | ICD-10-CM | POA: Diagnosis not present

## 2017-08-31 DIAGNOSIS — Z6833 Body mass index (BMI) 33.0-33.9, adult: Secondary | ICD-10-CM | POA: Diagnosis not present

## 2017-08-31 DIAGNOSIS — I509 Heart failure, unspecified: Secondary | ICD-10-CM | POA: Diagnosis not present

## 2017-09-04 ENCOUNTER — Ambulatory Visit: Payer: PPO | Admitting: Physician Assistant

## 2017-09-04 ENCOUNTER — Encounter: Payer: Self-pay | Admitting: Physician Assistant

## 2017-09-04 VITALS — BP 138/78 | HR 74 | Ht 62.0 in | Wt 175.8 lb

## 2017-09-04 DIAGNOSIS — I2510 Atherosclerotic heart disease of native coronary artery without angina pectoris: Secondary | ICD-10-CM

## 2017-09-04 DIAGNOSIS — I5042 Chronic combined systolic (congestive) and diastolic (congestive) heart failure: Secondary | ICD-10-CM | POA: Diagnosis not present

## 2017-09-04 DIAGNOSIS — I428 Other cardiomyopathies: Secondary | ICD-10-CM

## 2017-09-04 DIAGNOSIS — I10 Essential (primary) hypertension: Secondary | ICD-10-CM | POA: Diagnosis not present

## 2017-09-04 NOTE — Progress Notes (Signed)
Cardiology Office Note   Date:  09/04/2017   ID:  Tina May, Tina May 07/18/1945, MRN [id number]  PCP:  [name], MD  Cardiologist: [name], 02/23/2017  J.  Phylliss Bob, NP, 08/25/2017 [name], PA-C   Chief Complaint  Patient presents with  . Follow-up    pt denies chest pains, swelling in hands/feet, SOB    History of Present Illness: Tina May is a 72 y.o. female with a history of asthma (mold is a trigger), cardiomyopathy (no CAD at cath 01/2017), S-CHF with an EF of 30-35 percent on 07/18/2018echo, HTN, HLD,pancreatic lesion(ok now, f/u CT in 12/2017 to confirm).  4/23 office visit, BMP checked and was mildly elevated, dietary compliance emphasized, Entresto increased and early follow-up needed for BMET.  Recheck echo in 3 months on maximal medical therapy and consider ICD if EF is not improved  [name] presents for cardiology follow up.  She feels better now than she did right after she came back from [place]..   She is very aware of her heart beating, but has had no chest pain.   Her DOE is at baseline. She is staying busy, doing well with this. Playing golf and active around the house and yard.   No LE edema, no orthopnea or PND.   She is tolerating the increase in Arial well. No problems with any other medications.    Past Medical History:  Diagnosis Date  . Allergy   . Arthritis   . Asthma   . 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 Cataract    starting  . Diverticulosis   . External hemorrhoids   . Hepatic cyst   . Hyperlipidemia   . Hypertension   . Pancreatic lesion 03/11/13    Past Surgical History:  Procedure Laterality Date  . ABDOMINAL HYSTERECTOMY    . COLONOSCOPY  08-06-07   per Dr. [name], diverticulae only, repeat in 10 yrs   . LEFT HEART CATH AND CORONARY ANGIOGRAPHY N/A 01/27/2017   Procedure: LEFT HEART CATH AND CORONARY ANGIOGRAPHY;  Surgeon: Martinique, Peter M, MD;  Location: [facility];  Service:  Cardiovascular;  Laterality: N/A;  . SMALL INTESTINE SURGERY     from small intestine    Current Outpatient Medications  Medication Sig Dispense Refill  . albuterol (PROVENTIL HFA;VENTOLIN HFA) 108 (90 Base) MCG/ACT inhaler Inhale 2 puffs into the lungs every 4 (four) hours as needed for wheezing or shortness of breath. (Patient taking differently: Inhale 2 puffs into the lungs every 4 (four) hours as needed for wheezing or shortness of breath ((for mold/mildew exposure)). ) 3 Inhaler 3  . aspirin EC 81 MG tablet Take 81 mg by mouth daily.    [name] estradiol (ESTRACE) 0.5 MG tablet Take 0.5 mg by mouth once a week. For Menopausal hot flashes    . FIBER SELECT GUMMIES CHEW Chew 2 tablets by mouth daily.    . furosemide (LASIX) 40 MG tablet Take 1 tablet (40 mg total) by mouth daily. 30 tablet 6  . Hyaluronic Acid-Vitamin C (HYALURONIC ACID PO) Take 1 tablet by mouth daily. Women's Hyaluronic Acid    . meloxicam (MOBIC) 15 MG tablet Take 1 tablet (15 mg total) by mouth daily. 90 tablet 3  . Misc Natural Products (TART CHERRY ADVANCED PO) Take 60 mLs by mouth at bedtime.    . Multiple Vitamin (MULTIVITAMIN WITH MINERALS) TABS tablet Take 1 tablet by mouth daily. One-A-Day    .  potassium chloride (K-DUR) 10 MEQ tablet Take 10 meq every other day 30 tablet 6  . RESTASIS 0.05 % ophthalmic emulsion Place 1 drop into both eyes 2 (two) times daily.     . rosuvastatin (CRESTOR) 5 MG tablet Take 5 mg by mouth at bedtime.     . sacubitril-valsartan (ENTRESTO) 97-103 MG Take 1 tablet by mouth 2 (two) times daily. 60 tablet 0  . TURMERIC CURCUMIN PO Take 1,000 mg by mouth daily.    . carvedilol (COREG) 12.5 MG tablet Take 1 tablet (12.5 mg total) by mouth 2 (two) times daily with a meal. (Patient not taking: Reported on 09/04/2017) 60 tablet 2  . HYDROcodone-homatropine (HYDROMET) 5-1.5 MG/5ML syrup Take 5 mLs by mouth every 4 (four) hours as needed. (Patient not taking: Reported on 09/04/2017) 240 mL 0  .  LORazepam (ATIVAN) 0.5 MG tablet Take 1 tablet (0.5 mg total) by mouth 2 (two) times daily as needed for anxiety. (Patient not taking: Reported on 09/04/2017) 60 tablet 5   No current facility-administered medications for this visit.     Allergies:   Atorvastatin; Diclofenac; and Morphine and related    Social History:  The patient  reports that she quit smoking about 26 years ago. Her smoking use included cigarettes. She has a 20.00 pack-year smoking history. She has never used smokeless tobacco. She reports that she drinks about 4.2 oz of alcohol per week. She reports that she does not use drugs.   Family History:  The patient's family history includes Healthy in her brother and sister; Hyperlipidemia in her unknown relative; Hypertension in her unknown relative; Prostate cancer in her unknown relative.    ROS:  Please see the history of present illness. All other systems are reviewed and negative.    PHYSICAL EXAM: VS:  BP 138/78 (BP Location: Left Arm)   Pulse 74   Ht 5\' 2"  (1.575 m)   Wt 175 lb 12.8 oz (79.7 kg)   BMI 32.15 kg/m  , BMI Body mass index is 32.15 kg/m. GEN: Well nourished, well developed, female in no acute distress  HEENT: normal for age  Neck: Minimal JVD, no carotid bruit, no masses Cardiac: RRR; soft murmur, no rubs, or gallops Respiratory:  clear to auscultation bilaterally, normal work of breathing GI: soft, nontender, nondistended, + BS MS: no deformity or atrophy; no edema; distal pulses are 2+ in all 4 extremities   Skin: warm and dry, no rash Neuro:  Strength and sensation are intact Psych: euthymic mood, full affect   EKG:  EKG is not ordered today.  CATH: 01/27/2017  Prox RCA to Mid RCA lesion, 10 %stenosed.  There is mild left ventricular systolic dysfunction.  LV end diastolic pressure is normal.   1. No significant CAD 2. Mild LV dysfunction. EF is estimated at 45% 3. Normal LVEDP Plan: continue medical therapy.  STRESS ECHO:  12/22/2016 Left ventricular systolic function did not improve from baseline,   suggesting poor myocardial reserve.   No regional wall motion abnormalities and no ischemic ECG changes    were seen following exercise. Recommendations:  No evidence of stress induced ischemia, but myocardial reserve is poor. 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 Absence of myocardial recruitment with exercise may be a sign of worse prognosis.  ECHO: 11/19/2016 - Left ventricle: The cavity size was normal. There was mild   concentric hypertrophy. Systolic function was moderately to   severely reduced. The estimated ejection fraction was in the   range  of 30% to 35%. Diffuse hypokinesis. Doppler parameters are   consistent with abnormal left ventricular relaxation (grade 1   diastolic dysfunction). - Aortic valve: There was mild regurgitation. - Mitral valve: Calcified annulus. There was mild regurgitation. - Tricuspid valve: There was mild regurgitation. - Pericardium, extracardiac: A small, mostly posterior pericardial   effusion was identified circumferential to the heart.  Impressions: - There has been mild improvement in EF since prior study.   Recent Labs: 10/26/2016: B Natriuretic Peptide 483.2; TSH 1.050 10/29/2016: Magnesium 2.0 01/26/2017: Hemoglobin 13.8; Platelets 274 08/25/2017: BUN 23; Creatinine, Ser 0.85; NT-Pro BNP 388; Potassium 4.3; Sodium 145    Lipid Panel    Component Value Date/Time   CHOL 210 (H) 08/22/2016 0942   TRIG 149.0 08/22/2016 0942   HDL 67.90 08/22/2016 0942   CHOLHDL 3 08/22/2016 0942   VLDL 29.8 08/22/2016 0942   LDLCALC 112 (H) 08/22/2016 0942   LDLDIRECT 137.6 11/05/2010 0935     Wt Readings from Last 3 Encounters:  09/04/17 175 lb 12.8 oz (79.7 kg)  08/25/17 177 lb 12.8 oz (80.6 kg)  04/29/17 175 lb (79.4 kg)     Other studies Reviewed: Additional studies/ records that were reviewed today include: office notes, hospital records  and testing.  ASSESSMENT AND PLAN:  1.  NICM: Continue increased dose of Entresto plus carvedilol and current Lasix dose.  She had recent labs that were stable, no labs today.  Recheck an echocardiogram in 3 months.  She understands that if her EF is still low, we will discuss an ICD.  She is not interested in a LifeVest.  2.  Chronic combined systolic and diastolic CHF: See above, no med changes  3.  Minimal coronary artery disease at cath: Encouraged cardiac risk factor reduction.  She is working on heart healthy, low-sodium eating.  Continue aspirin, beta-blocker, and statin.  -  Lipid profile is followed by Dr. Sarajane Jews, goal LDL is less than 70.  4.  Hypertension: Blood pressure is well controlled on current medications.   Current medicines are reviewed at length with the patient today.  The patient does not have concerns regarding medicines.  The following changes have been made:  no change  Labs/ tests ordered today include:  No orders of the defined types were placed in this encounter.    Disposition:   FU with [name]  Signed, [name], PA-C  09/04/2017 11:10 AM    [place] Phone: [phone]; Fax: [phone]  This note was written with the assistance of speech recognition software. Please excuse any transcriptional errors.

## 2017-09-04 NOTE — Patient Instructions (Signed)
Medication Instructions:  No medication changes  If you need a refill on your cardiac medications before your next appointment, please call your pharmacy.  Labwork: Have lab work drawn when you see Dr.Fry next week.  Testing/Procedures: None ordered.   Follow-Up: Your physician wants you to follow-up in: 3 Months with Dr.Nahser.    Thank you for choosing [facility] at [facility]!!

## 2017-09-06 ENCOUNTER — Other Ambulatory Visit: Payer: Self-pay | Admitting: Physician Assistant

## 2017-09-07 ENCOUNTER — Telehealth: Payer: Self-pay | Admitting: Family Medicine

## 2017-09-07 NOTE — Telephone Encounter (Signed)
Copied from [place] [id number] [phone]. Topic: Quick Communication - See Telephone Encounter >> Sep 07, 2017  9:31 AM Ahmed Prima L wrote: CRM for notification. See Telephone encounter for: 09/07/17.  Patient is schedule for her physical on 5/24. She is requesting she have her blood work done this Friday 5/10. She is stating that her heart doctor is wanting her to have blood work done and she does not want to do it twice. I advised her that we do the labs the same day of the physical after seeing the provider. She said she does not want to wait until 5/24 for blood work. Please advise Call back @ [phone]

## 2017-09-07 NOTE — Telephone Encounter (Signed)
Sent to PCP to advise 

## 2017-09-07 NOTE — Telephone Encounter (Signed)
REFILL 

## 2017-09-08 ENCOUNTER — Other Ambulatory Visit: Payer: Self-pay | Admitting: Family Medicine

## 2017-09-08 DIAGNOSIS — I10 Essential (primary) hypertension: Secondary | ICD-10-CM

## 2017-09-08 NOTE — Progress Notes (Signed)
The labs were ordered so she can do these in advance

## 2017-09-08 NOTE — Telephone Encounter (Signed)
The labs were ordered so she can get these drawn ahead of time

## 2017-09-09 NOTE — Telephone Encounter (Signed)
Called and spoke with pt. Pt is scheduled to have fasting lab done 5/10 @ 8:00AM

## 2017-09-11 ENCOUNTER — Other Ambulatory Visit (INDEPENDENT_AMBULATORY_CARE_PROVIDER_SITE_OTHER): Payer: PPO

## 2017-09-11 ENCOUNTER — Encounter: Payer: Self-pay | Admitting: Family Medicine

## 2017-09-11 ENCOUNTER — Ambulatory Visit (INDEPENDENT_AMBULATORY_CARE_PROVIDER_SITE_OTHER): Payer: PPO | Admitting: Family Medicine

## 2017-09-11 VITALS — BP 118/62 | HR 73 | Temp 97.7°F | Ht 62.0 in | Wt 176.0 lb

## 2017-09-11 DIAGNOSIS — I10 Essential (primary) hypertension: Secondary | ICD-10-CM

## 2017-09-11 DIAGNOSIS — J339 Nasal polyp, unspecified: Secondary | ICD-10-CM | POA: Diagnosis not present

## 2017-09-11 LAB — HEPATIC FUNCTION PANEL
ALK PHOS: 50 U/L (ref 39–117)
ALT: 21 U/L (ref 0–35)
AST: 20 U/L (ref 0–37)
Albumin: 4.4 g/dL (ref 3.5–5.2)
BILIRUBIN TOTAL: 0.5 mg/dL (ref 0.2–1.2)
Bilirubin, Direct: 0.1 mg/dL (ref 0.0–0.3)
Total Protein: 6.7 g/dL (ref 6.0–8.3)

## 2017-09-11 LAB — POC URINALSYSI DIPSTICK (AUTOMATED)
Bilirubin, UA: NEGATIVE
GLUCOSE UA: NEGATIVE
Ketones, UA: NEGATIVE
Leukocytes, UA: NEGATIVE
NITRITE UA: NEGATIVE
Spec Grav, UA: 1.025 (ref 1.010–1.025)
UROBILINOGEN UA: 0.2 U/dL
pH, UA: 6 (ref 5.0–8.0)

## 2017-09-11 LAB — BASIC METABOLIC PANEL
BUN: 22 mg/dL (ref 6–23)
CALCIUM: 9.4 mg/dL (ref 8.4–10.5)
CO2: 31 meq/L (ref 19–32)
Chloride: 104 mEq/L (ref 96–112)
Creatinine, Ser: 0.82 mg/dL (ref 0.40–1.20)
GFR: 72.82 mL/min (ref >60.00)
GLUCOSE: 111 mg/dL — AB (ref 70–99)
Potassium: 4.4 mEq/L (ref 3.5–5.1)
SODIUM: 143 meq/L (ref 135–145)

## 2017-09-11 LAB — CBC WITH DIFFERENTIAL/PLATELET
BASOS ABS: 0.1 10*3/uL (ref 0.0–0.1)
Basophils Relative: 1.3 % (ref 0.0–3.0)
EOS ABS: 0.3 10*3/uL (ref 0.0–0.7)
Eosinophils Relative: 5.3 % — ABNORMAL HIGH (ref 0.0–5.0)
HEMATOCRIT: 41.5 % (ref 36.0–46.0)
Hemoglobin: 13.9 g/dL (ref 12.0–15.0)
LYMPHS PCT: 26.1 % (ref 12.0–46.0)
Lymphs Abs: 1.3 10*3/uL (ref 0.7–4.0)
MCHC: 33.5 g/dL (ref 30.0–36.0)
MCV: 95.7 fl (ref 78.0–100.0)
Monocytes Absolute: 0.5 10*3/uL (ref 0.1–1.0)
Monocytes Relative: 9.8 % (ref 3.0–12.0)
Neutro Abs: 2.9 10*3/uL (ref 1.4–7.7)
Neutrophils Relative %: 57.5 % (ref 43.0–77.0)
Platelets: 235 10*3/uL (ref 150.0–400.0)
RBC: 4.33 Mil/uL (ref 3.87–5.11)
RDW: 13.3 % (ref 11.5–15.5)
WBC: 5 10*3/uL (ref 4.0–10.5)

## 2017-09-11 LAB — LIPID PANEL
CHOL/HDL RATIO: 4
Cholesterol: 197 mg/dL (ref 0–200)
HDL: 54.7 mg/dL (ref >39.00)
NONHDL: 142.23
TRIGLYCERIDES: 204 mg/dL — AB (ref 0.0–149.0)
VLDL: 40.8 mg/dL — ABNORMAL HIGH (ref 0.0–40.0)

## 2017-09-11 LAB — TSH: TSH: 0.77 u[IU]/mL (ref 0.35–4.50)

## 2017-09-11 LAB — LDL CHOLESTEROL, DIRECT: Direct LDL: 113 mg/dL

## 2017-09-11 NOTE — Progress Notes (Unsigned)
Yellow

## 2017-09-11 NOTE — Progress Notes (Signed)
   Subjective:    Patient ID: Tina May, female    DOB: 1946-02-04, 72 y.o.   MRN: [id number]  HPI Here for a referral to ENT. She has struggles with recurrent sinus infections the past 2 years and we last treated her for this in December. She spends every winter in [place] and she saw an ENT there several times. She saw Dr. Carlis Abbott at [facility] in [place], [place]. He performed a sinus lavage and cultured her sinus fluids. This grew a bacteria with multiple resistances apparently. She ended up taking a 14 day course of Doxycycline and this seemed to knock out the infection. He also found multiple polyps that need to be removed. Now that she is back in [place] she asks to see an ENT here for these. Currently she feels well.    Review of Systems  Constitutional: Negative.   HENT: Negative.   Eyes: Negative.   Respiratory: Negative.   Cardiovascular: Negative.        Objective:   Physical Exam  Constitutional: She appears well-developed and well-nourished.  HENT:  Right Ear: External ear normal.  Left Ear: External ear normal.  Nose: Nose normal.  Mouth/Throat: Oropharynx is clear and moist.  Eyes: Conjunctivae are normal.  Neck: No thyromegaly present.  Cardiovascular: Normal rate, regular rhythm, normal heart sounds and intact distal pulses.  Pulmonary/Chest: Effort normal and breath sounds normal.  Lymphadenopathy:    She has no cervical adenopathy.          Assessment & Plan:  Nasal polyps with recurrent sinusitis. We will refer her to ENT.  [name], MD

## 2017-09-14 ENCOUNTER — Ambulatory Visit: Payer: PPO | Admitting: Cardiology

## 2017-09-18 ENCOUNTER — Ambulatory Visit (INDEPENDENT_AMBULATORY_CARE_PROVIDER_SITE_OTHER): Payer: PPO | Admitting: Family Medicine

## 2017-09-18 ENCOUNTER — Telehealth: Payer: Self-pay | Admitting: Family Medicine

## 2017-09-18 ENCOUNTER — Encounter: Payer: Self-pay | Admitting: Family Medicine

## 2017-09-18 ENCOUNTER — Other Ambulatory Visit: Payer: Self-pay | Admitting: Family Medicine

## 2017-09-18 ENCOUNTER — Ambulatory Visit: Payer: Self-pay

## 2017-09-18 VITALS — BP 110/76 | HR 76 | Temp 98.1°F | Wt 175.0 lb

## 2017-09-18 DIAGNOSIS — K5732 Diverticulitis of large intestine without perforation or abscess without bleeding: Secondary | ICD-10-CM | POA: Diagnosis not present

## 2017-09-18 MED ORDER — CIPROFLOXACIN HCL 500 MG PO TABS: 500.0000 mg | ORAL_TABLET | Freq: Two times a day (BID) | ORAL | 0 refills | Status: DC

## 2017-09-18 MED ORDER — METRONIDAZOLE 500 MG PO TABS: 500.0000 mg | ORAL_TABLET | Freq: Three times a day (TID) | ORAL | 0 refills | Status: DC

## 2017-09-18 NOTE — Telephone Encounter (Signed)
Copied from [place] [id number]. Topic: Quick Communication - Rx Refill/Question >> Sep 18, 2017  2:57 PM Bea Graff, NT wrote: Medication: meloxicam (MOBIC) 15 MG tablet   Has the patient contacted their pharmacy? Yes.   (Agent: If no, request that the patient contact the pharmacy for the refill.) (Agent: If yes, when and what did the pharmacy advise?)  Preferred Pharmacy (with phone number or street name): [id number] [place], [place] [phone] (Phone) [phone] (Fax)      Agent: Please be advised that RX refills may take up to 3 business days. We ask that you follow-up with your pharmacy.

## 2017-09-18 NOTE — Telephone Encounter (Signed)
Called Tina May to return message. Tina May stated that Dr Sarajane Jews forgot to fill her Meloxicam. Tina May stated that since she needs it for her arthritis and has to take something for the pain, she is going to take Ibuprofen prn.

## 2017-09-18 NOTE — Telephone Encounter (Signed)
Mobic 15 mg refill request  LOV 04/19/17 with Dr. Sarajane Jews

## 2017-09-18 NOTE — Telephone Encounter (Signed)
Patient called and c/o "abdominal pain." She says "it started about 4 days ago. I have diverticulitis and knows how it feels, and I believe this is what is causing the pain. It is a mild pain, at the lower left side. It stays there for the most part and I have diarrhea, no other symptoms." According to protocol, see PCP within 24 hours, appointment scheduled for today at 1445 with Dr. Sarajane Jews, care advice given, patient verbalized understanding.  Reason for Disposition . [1] MILD pain (e.g., does not interfere with normal activities) AND [2] pain comes and goes (cramps) AND [3] present > 48 hours  Answer Assessment - Initial Assessment Questions 1. LOCATION: "Where does it hurt?"      Left Lower abdomen 2. RADIATION: "Does the pain shoot anywhere else?" (e.g., chest, back)     No 3. ONSET: "When did the pain begin?" (e.g., minutes, hours or days ago)      4 days ago 4. SUDDEN: "Gradual or sudden onset?"     Gradual 5. PATTERN "Does the pain come and go, or is it constant?"    - If constant: "Is it getting better, staying the same, or worsening?"      (Note: Constant means the pain never goes away completely; most serious pain is constant and it progresses)     - If intermittent: "How long does it last?" "Do you have pain now?"     (Note: Intermittent means the pain goes away completely between bouts)     Constant 6. SEVERITY: "How bad is the pain?"  (e.g., Scale 1-10; mild, moderate, or severe)   - MILD (1-3): doesn't interfere with normal activities, abdomen soft and not tender to touch    - MODERATE (4-7): interferes with normal activities or awakens from sleep, tender to touch    - SEVERE (8-10): excruciating pain, doubled over, unable to do any normal activities      3 right now 7. RECURRENT SYMPTOM: "Have you ever had this type of abdominal pain before?" If so, ask: "When was the last time?" and "What happened that time?"      Yes; diverticulitis, a couple of years ago 8. CAUSE: "What do  you think is causing the abdominal pain?"     Diverticulitis flare up 9. RELIEVING/AGGRAVATING FACTORS: "What makes it better or worse?" (e.g., movement, antacids, bowel movement)     I don't know 10. OTHER SYMPTOMS: "Has there been any vomiting, diarrhea, constipation, or urine problems?"       Diarrhea 11. PREGNANCY: "Is there any chance you are pregnant?" "When was your last menstrual period?"       No  Protocols used: ABDOMINAL PAIN - [facility]

## 2017-09-18 NOTE — Telephone Encounter (Signed)
Please advise 

## 2017-09-18 NOTE — Telephone Encounter (Signed)
Patient called, left VM to return call to the office to discuss her diverticulitis symptoms. Patient is requesting refill of Mobic today and something for diverticulits.  Mobic refill Last OV:08/22/16 Last refill:08/22/16 PCP:Fry [facility]acy: [id number] [place], [place] [phone] (Phone) [phone] (Fax)

## 2017-09-18 NOTE — Progress Notes (Signed)
   Subjective:    Patient ID: Tina May, female    DOB: [birth year], 72 y.o.   MRN: [id number]  HPI Here for 2 days of LLQ pain and low grade fever. She thinks this is diverticulitis because she has had this several times before. No blood in the stools. No N or V. No urinary symptoms.    Review of Systems  Constitutional: Negative.   Respiratory: Negative.   Cardiovascular: Negative.   Gastrointestinal: Positive for abdominal pain. Negative for abdominal distention, anal bleeding, blood in stool, constipation, diarrhea, nausea, rectal pain and vomiting.  Genitourinary: Negative.        Objective:   Physical Exam  Constitutional: She appears well-developed and well-nourished. No distress.  Cardiovascular: Normal rate, regular rhythm, normal heart sounds and intact distal pulses.  Pulmonary/Chest: Effort normal and breath sounds normal. No stridor. No respiratory distress. She has no wheezes. She has no rales.  Abdominal: Soft. Bowel sounds are normal. She exhibits no distension and no mass. There is no rebound and no guarding. No hernia.  Mildly tender in the LLQ           Assessment & Plan:  Diverticulitis, treat with Flagyl and Cipro.  [name], MD

## 2017-09-18 NOTE — Telephone Encounter (Signed)
Copied from [place] [id number]. Topic: Quick Communication - Rx Refill/Question >> Sep 18, 2017  8:19 AM Arletha Grippe wrote: Medication: meloxicam and something for diverticulitis Has the patient contacted their pharmacy? Yes.   (Agent: If no, request that the patient contact the pharmacy for the refill.) Preferred Pharm[facility] (with phone number [facility] [place]  Agent: Please be advised that RX refills may take up to 3 business days. We ask that you follow-up with your pharmacy.  Pt is asking for something for diverticulitis - she says it is flaring up again.  Declined to see provider other than Dr Sarajane Jews. Pt is going out of town and would like this filled prior to leaving.  Please call pt either way - [phone]

## 2017-09-21 MED ORDER — MELOXICAM 15 MG PO TABS: 15.0000 mg | ORAL_TABLET | Freq: Every day | ORAL | 3 refills | Status: DC

## 2017-09-24 ENCOUNTER — Other Ambulatory Visit: Payer: Self-pay | Admitting: *Deleted

## 2017-09-24 ENCOUNTER — Other Ambulatory Visit: Payer: Self-pay | Admitting: Family Medicine

## 2017-09-24 MED ORDER — SACUBITRIL-VALSARTAN 97-103 MG PO TABS: 1.0000 | ORAL_TABLET | Freq: Two times a day (BID) | ORAL | 11 refills | Status: DC

## 2017-09-24 NOTE — Telephone Encounter (Signed)
Last OV 09/18/2017   Medication is listed on pt's med list but NOT refilled by Dr. Ricky Ala to PCP for approval

## 2017-09-25 ENCOUNTER — Ambulatory Visit (INDEPENDENT_AMBULATORY_CARE_PROVIDER_SITE_OTHER): Payer: PPO | Admitting: Family Medicine

## 2017-09-25 ENCOUNTER — Encounter: Payer: Self-pay | Admitting: Family Medicine

## 2017-09-25 VITALS — BP 94/64 | HR 77 | Temp 97.8°F | Ht 61.0 in | Wt 175.0 lb

## 2017-09-25 DIAGNOSIS — Z0000 Encounter for general adult medical examination without abnormal findings: Secondary | ICD-10-CM | POA: Diagnosis not present

## 2017-09-25 NOTE — Progress Notes (Signed)
   Subjective:    Patient ID: Tina May, female    DOB: 06/27/1945, 72 y.o.   MRN: [id number]  HPI Here for a well exam. She feels well. She is recovering from a recent bout of diverticulitis.    Review of Systems  Constitutional: Negative.   HENT: Negative.   Eyes: Negative.   Respiratory: Negative.   Cardiovascular: Negative.   Gastrointestinal: Negative.   Genitourinary: Negative for decreased urine volume, difficulty urinating, dyspareunia, dysuria, enuresis, flank pain, frequency, hematuria, pelvic pain and urgency.  Musculoskeletal: Negative.   Skin: Negative.   Neurological: Negative.   Psychiatric/Behavioral: Negative.        Objective:   Physical Exam  Constitutional: She is oriented to person, place, and time. She appears well-developed and well-nourished. No distress.  HENT:  Head: Normocephalic and atraumatic.  Right Ear: External ear normal.  Left Ear: External ear normal.  Nose: Nose normal.  Mouth/Throat: Oropharynx is clear and moist. No oropharyngeal exudate.  Eyes: Pupils are equal, round, and reactive to light. Conjunctivae and EOM are normal. No scleral icterus.  Neck: Normal range of motion. Neck supple. No JVD present. No thyromegaly present.  Cardiovascular: Normal rate, regular rhythm, normal heart sounds and intact distal pulses. Exam reveals no gallop and no friction rub.  No murmur heard. Pulmonary/Chest: Effort normal and breath sounds normal. No respiratory distress. She has no wheezes. She has no rales. She exhibits no tenderness.  Abdominal: Soft. Bowel sounds are normal. She exhibits no distension and no mass. There is no tenderness. There is no rebound and no guarding.  Musculoskeletal: Normal range of motion. She exhibits no edema or tenderness.  Lymphadenopathy:    She has no cervical adenopathy.  Neurological: She is alert and oriented to person, place, and time. She has normal reflexes. She displays normal reflexes. No cranial nerve  deficit. She exhibits normal muscle tone. Coordination normal.  Skin: Skin is warm and dry. No rash noted. No erythema.  Psychiatric: She has a normal mood and affect. Her behavior is normal. Judgment and thought content normal.          Assessment & Plan:  Well exam. We discussed diet and exercise. She will finish up the last of her antibiotics.  [name], MD

## 2017-09-30 ENCOUNTER — Ambulatory Visit: Payer: PPO | Admitting: Physician Assistant

## 2017-10-07 DIAGNOSIS — J3089 Other allergic rhinitis: Secondary | ICD-10-CM | POA: Diagnosis not present

## 2017-10-07 DIAGNOSIS — J324 Chronic pansinusitis: Secondary | ICD-10-CM | POA: Diagnosis not present

## 2017-10-07 DIAGNOSIS — J339 Nasal polyp, unspecified: Secondary | ICD-10-CM | POA: Diagnosis not present

## 2017-10-09 ENCOUNTER — Ambulatory Visit (INDEPENDENT_AMBULATORY_CARE_PROVIDER_SITE_OTHER): Payer: PPO | Admitting: Family Medicine

## 2017-10-09 ENCOUNTER — Encounter: Payer: Self-pay | Admitting: Family Medicine

## 2017-10-09 VITALS — BP 116/72 | HR 74 | Temp 97.7°F | Ht 61.0 in | Wt 174.4 lb

## 2017-10-09 DIAGNOSIS — M545 Low back pain, unspecified: Secondary | ICD-10-CM

## 2017-10-09 MED ORDER — TRAMADOL HCL 50 MG PO TABS: 100.0000 mg | ORAL_TABLET | Freq: Four times a day (QID) | ORAL | 1 refills | Status: DC | PRN

## 2017-10-09 MED ORDER — CYCLOBENZAPRINE HCL 10 MG PO TABS: 10.0000 mg | ORAL_TABLET | Freq: Three times a day (TID) | ORAL | 2 refills | Status: DC | PRN

## 2017-10-09 NOTE — Progress Notes (Signed)
   Subjective:    Patient ID: Tina May, female    DOB: 18-Feb-1946, 72 y.o.   MRN: [id number]  HPI Here for 5 days of pain in the lower back. No recent trauma. The pain radiated down the right leg at first but this has stopped. No numbness or weakness in the legs. She is taking Meloxicam.    Review of Systems  Constitutional: Negative.   Respiratory: Negative.   Cardiovascular: Negative.   Musculoskeletal: Positive for back pain.       Objective:   Physical Exam  Constitutional: She appears well-developed and well-nourished.  Cardiovascular: Normal rate, regular rhythm, normal heart sounds and intact distal pulses.  Pulmonary/Chest: Effort normal and breath sounds normal.  Musculoskeletal:  The lower back is not tender, but she is tender over the right sciatic notch. ROM is full.           Assessment & Plan:  Low back pain, add Tramadol and Flexeril prn. [name], MD

## 2017-10-22 DIAGNOSIS — H02883 Meibomian gland dysfunction of right eye, unspecified eyelid: Secondary | ICD-10-CM | POA: Diagnosis not present

## 2017-10-22 DIAGNOSIS — H02886 Meibomian gland dysfunction of left eye, unspecified eyelid: Secondary | ICD-10-CM | POA: Diagnosis not present

## 2017-10-22 DIAGNOSIS — H02831 Dermatochalasis of right upper eyelid: Secondary | ICD-10-CM | POA: Diagnosis not present

## 2017-10-22 DIAGNOSIS — H25813 Combined forms of age-related cataract, bilateral: Secondary | ICD-10-CM | POA: Diagnosis not present

## 2017-10-26 DIAGNOSIS — M9902 Segmental and somatic dysfunction of thoracic region: Secondary | ICD-10-CM | POA: Diagnosis not present

## 2017-10-26 DIAGNOSIS — M9904 Segmental and somatic dysfunction of sacral region: Secondary | ICD-10-CM | POA: Diagnosis not present

## 2017-10-26 DIAGNOSIS — M5137 Other intervertebral disc degeneration, lumbosacral region: Secondary | ICD-10-CM | POA: Diagnosis not present

## 2017-10-26 DIAGNOSIS — M5136 Other intervertebral disc degeneration, lumbar region: Secondary | ICD-10-CM | POA: Diagnosis not present

## 2017-10-26 DIAGNOSIS — M9903 Segmental and somatic dysfunction of lumbar region: Secondary | ICD-10-CM | POA: Diagnosis not present

## 2017-10-26 DIAGNOSIS — M5135 Other intervertebral disc degeneration, thoracolumbar region: Secondary | ICD-10-CM | POA: Diagnosis not present

## 2017-10-30 DIAGNOSIS — M5135 Other intervertebral disc degeneration, thoracolumbar region: Secondary | ICD-10-CM | POA: Diagnosis not present

## 2017-10-30 DIAGNOSIS — M5137 Other intervertebral disc degeneration, lumbosacral region: Secondary | ICD-10-CM | POA: Diagnosis not present

## 2017-10-30 DIAGNOSIS — M5136 Other intervertebral disc degeneration, lumbar region: Secondary | ICD-10-CM | POA: Diagnosis not present

## 2017-10-30 DIAGNOSIS — M9904 Segmental and somatic dysfunction of sacral region: Secondary | ICD-10-CM | POA: Diagnosis not present

## 2017-10-30 DIAGNOSIS — M9902 Segmental and somatic dysfunction of thoracic region: Secondary | ICD-10-CM | POA: Diagnosis not present

## 2017-10-30 DIAGNOSIS — M9903 Segmental and somatic dysfunction of lumbar region: Secondary | ICD-10-CM | POA: Diagnosis not present

## 2017-11-02 DIAGNOSIS — M5137 Other intervertebral disc degeneration, lumbosacral region: Secondary | ICD-10-CM | POA: Diagnosis not present

## 2017-11-02 DIAGNOSIS — M5135 Other intervertebral disc degeneration, thoracolumbar region: Secondary | ICD-10-CM | POA: Diagnosis not present

## 2017-11-02 DIAGNOSIS — M9904 Segmental and somatic dysfunction of sacral region: Secondary | ICD-10-CM | POA: Diagnosis not present

## 2017-11-02 DIAGNOSIS — M5136 Other intervertebral disc degeneration, lumbar region: Secondary | ICD-10-CM | POA: Diagnosis not present

## 2017-11-02 DIAGNOSIS — M9902 Segmental and somatic dysfunction of thoracic region: Secondary | ICD-10-CM | POA: Diagnosis not present

## 2017-11-02 DIAGNOSIS — M9903 Segmental and somatic dysfunction of lumbar region: Secondary | ICD-10-CM | POA: Diagnosis not present

## 2017-11-04 DIAGNOSIS — M9902 Segmental and somatic dysfunction of thoracic region: Secondary | ICD-10-CM | POA: Diagnosis not present

## 2017-11-04 DIAGNOSIS — M9904 Segmental and somatic dysfunction of sacral region: Secondary | ICD-10-CM | POA: Diagnosis not present

## 2017-11-04 DIAGNOSIS — M5135 Other intervertebral disc degeneration, thoracolumbar region: Secondary | ICD-10-CM | POA: Diagnosis not present

## 2017-11-04 DIAGNOSIS — M5137 Other intervertebral disc degeneration, lumbosacral region: Secondary | ICD-10-CM | POA: Diagnosis not present

## 2017-11-04 DIAGNOSIS — M5136 Other intervertebral disc degeneration, lumbar region: Secondary | ICD-10-CM | POA: Diagnosis not present

## 2017-11-04 DIAGNOSIS — M9903 Segmental and somatic dysfunction of lumbar region: Secondary | ICD-10-CM | POA: Diagnosis not present

## 2017-11-09 DIAGNOSIS — M9904 Segmental and somatic dysfunction of sacral region: Secondary | ICD-10-CM | POA: Diagnosis not present

## 2017-11-09 DIAGNOSIS — M9903 Segmental and somatic dysfunction of lumbar region: Secondary | ICD-10-CM | POA: Diagnosis not present

## 2017-11-09 DIAGNOSIS — M5137 Other intervertebral disc degeneration, lumbosacral region: Secondary | ICD-10-CM | POA: Diagnosis not present

## 2017-11-09 DIAGNOSIS — M9902 Segmental and somatic dysfunction of thoracic region: Secondary | ICD-10-CM | POA: Diagnosis not present

## 2017-11-09 DIAGNOSIS — M5136 Other intervertebral disc degeneration, lumbar region: Secondary | ICD-10-CM | POA: Diagnosis not present

## 2017-11-09 DIAGNOSIS — M5135 Other intervertebral disc degeneration, thoracolumbar region: Secondary | ICD-10-CM | POA: Diagnosis not present

## 2017-11-11 ENCOUNTER — Other Ambulatory Visit: Payer: Self-pay | Admitting: Physician Assistant

## 2017-11-16 DIAGNOSIS — M9903 Segmental and somatic dysfunction of lumbar region: Secondary | ICD-10-CM | POA: Diagnosis not present

## 2017-11-16 DIAGNOSIS — M5135 Other intervertebral disc degeneration, thoracolumbar region: Secondary | ICD-10-CM | POA: Diagnosis not present

## 2017-11-16 DIAGNOSIS — M5136 Other intervertebral disc degeneration, lumbar region: Secondary | ICD-10-CM | POA: Diagnosis not present

## 2017-11-16 DIAGNOSIS — M9904 Segmental and somatic dysfunction of sacral region: Secondary | ICD-10-CM | POA: Diagnosis not present

## 2017-11-16 DIAGNOSIS — M5137 Other intervertebral disc degeneration, lumbosacral region: Secondary | ICD-10-CM | POA: Diagnosis not present

## 2017-11-16 DIAGNOSIS — M9902 Segmental and somatic dysfunction of thoracic region: Secondary | ICD-10-CM | POA: Diagnosis not present

## 2017-11-21 DIAGNOSIS — M5136 Other intervertebral disc degeneration, lumbar region: Secondary | ICD-10-CM | POA: Diagnosis not present

## 2017-11-21 DIAGNOSIS — M9902 Segmental and somatic dysfunction of thoracic region: Secondary | ICD-10-CM | POA: Diagnosis not present

## 2017-11-21 DIAGNOSIS — M9903 Segmental and somatic dysfunction of lumbar region: Secondary | ICD-10-CM | POA: Diagnosis not present

## 2017-11-21 DIAGNOSIS — M5135 Other intervertebral disc degeneration, thoracolumbar region: Secondary | ICD-10-CM | POA: Diagnosis not present

## 2017-11-21 DIAGNOSIS — M5137 Other intervertebral disc degeneration, lumbosacral region: Secondary | ICD-10-CM | POA: Diagnosis not present

## 2017-11-21 DIAGNOSIS — M9904 Segmental and somatic dysfunction of sacral region: Secondary | ICD-10-CM | POA: Diagnosis not present

## 2017-11-23 DIAGNOSIS — M5137 Other intervertebral disc degeneration, lumbosacral region: Secondary | ICD-10-CM | POA: Diagnosis not present

## 2017-11-23 DIAGNOSIS — M9902 Segmental and somatic dysfunction of thoracic region: Secondary | ICD-10-CM | POA: Diagnosis not present

## 2017-11-23 DIAGNOSIS — M9903 Segmental and somatic dysfunction of lumbar region: Secondary | ICD-10-CM | POA: Diagnosis not present

## 2017-11-23 DIAGNOSIS — M9904 Segmental and somatic dysfunction of sacral region: Secondary | ICD-10-CM | POA: Diagnosis not present

## 2017-11-23 DIAGNOSIS — M5135 Other intervertebral disc degeneration, thoracolumbar region: Secondary | ICD-10-CM | POA: Diagnosis not present

## 2017-11-23 DIAGNOSIS — M5136 Other intervertebral disc degeneration, lumbar region: Secondary | ICD-10-CM | POA: Diagnosis not present

## 2017-12-20 ENCOUNTER — Other Ambulatory Visit: Payer: Self-pay | Admitting: Physician Assistant

## 2017-12-22 NOTE — Telephone Encounter (Signed)
Last filled by [name]

## 2017-12-28 DIAGNOSIS — T1501XA Foreign body in cornea, right eye, initial encounter: Secondary | ICD-10-CM | POA: Diagnosis not present

## 2017-12-28 DIAGNOSIS — H5711 Ocular pain, right eye: Secondary | ICD-10-CM | POA: Diagnosis not present

## 2018-01-01 ENCOUNTER — Ambulatory Visit: Payer: PPO | Admitting: Cardiovascular Disease

## 2018-01-01 ENCOUNTER — Encounter: Payer: Self-pay | Admitting: Cardiovascular Disease

## 2018-01-01 VITALS — BP 120/76 | HR 83 | Ht 61.0 in | Wt 174.8 lb

## 2018-01-01 DIAGNOSIS — E785 Hyperlipidemia, unspecified: Secondary | ICD-10-CM | POA: Diagnosis not present

## 2018-01-01 DIAGNOSIS — I5022 Chronic systolic (congestive) heart failure: Secondary | ICD-10-CM

## 2018-01-01 MED ORDER — CARVEDILOL 25 MG PO TABS: 25.0000 mg | ORAL_TABLET | Freq: Two times a day (BID) | ORAL | 3 refills | Status: DC

## 2018-01-01 NOTE — Patient Instructions (Addendum)
Medication Instructions:  Your physician has recommended you make the following change in your medication:   INCREASE Carvedilol (Coreg) to 25 mg twice daily   Labwork: Your physician recommends that you return for lab work in: 3 months at your next office visit or a few days before You will need to FAST for this appointment - nothing to eat or drink after midnight the night before except water.    Testing/Procedures: None Ordered   Follow-Up: Your physician recommends that you schedule a follow-up appointment in: 2-3 months with [name], PA per [name]   If you need a refill on your cardiac medications before your next appointment, please call your pharmacy.   Thank you for choosing [facility]! Christen Bame, RN [phone]

## 2018-01-01 NOTE — Progress Notes (Signed)
Cardiology Office Note:    Date:  01/01/2018   ID:  Tina May, Tina May 09/30/1945, MRN [id number]  PCP:  [name], MD  Cardiologist:  Mertie Moores, MD    Referring MD: [name], MD   Problem List 1. Chronic systolic CHF  2. Essential HTN 3. Hyperlipidemia 4. COPD  5. Pancreatic lesion:  Chief Complaint  Patient presents with  . Congestive Heart Failure    History of Present Illness:    Tina May is a 72 y.o. female with a hx of chronic systolic CHF in June, 4010.  I saw her in consultation while in the hospital .  EF by echo at that time was 25-30%.Her last week and is unsure and it looks her heart function is improving as we started at the 25-30 range 45 kg  She has been on Entresto 24-26 mg BID Coreg 6.25 BID Lasix 40 mg a day   Feeling much better Cath 01/27/17 showed mild RCA 10% stenosis - otherwise normal coronaries.  Feeling great. Tolerating the Entresto  No shortness of breath   Aug. 30, 2019:  Tina May is seen today for follow-up of her congestive heart failure.  She is done very well.  She is on Entresto 97-103 mg a day.  She is tolerating it quite well. Exercising regularly  - exercise capacity is much better.   Had a great trip to [place] recently  - fished, dog sledded, rode in a helicopter.   Past Medical History:  Diagnosis Date  . Allergy   . Arthritis   . Asthma   . 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 Cataract    starting  . Diverticulosis   . External hemorrhoids   . Hepatic cyst   . Hyperlipidemia   . Hypertension   . Pancreatic lesion 03/11/13    Past Surgical History:  Procedure Laterality Date  . ABDOMINAL HYSTERECTOMY    . COLONOSCOPY  08-06-07   per Dr. [name], diverticulae only, repeat in 10 yrs   . LEFT HEART CATH AND CORONARY ANGIOGRAPHY N/A 01/27/2017   Procedure: LEFT HEART CATH AND CORONARY ANGIOGRAPHY;  Surgeon: Martinique, Peter M, MD;  Location: [facility];  Service: Cardiovascular;  Laterality: N/A;    . SMALL INTESTINE SURGERY     from small intestine    Current Medications: Current Meds  Medication Sig  . albuterol (PROVENTIL HFA;VENTOLIN HFA) 108 (90 Base) MCG/ACT inhaler Inhale 2 puffs into the lungs every 4 (four) hours as needed for wheezing or shortness of breath.  [name] aspirin EC 81 MG tablet Take 81 mg by mouth daily.  . cyclobenzaprine (FLEXERIL) 10 MG tablet Take 1 tablet (10 mg total) by mouth 3 (three) times daily as needed for muscle spasms.  [name] estradiol (ESTRACE) 0.5 MG tablet Take 0.5 mg by mouth once a week. For Menopausal hot flashes  . FIBER SELECT GUMMIES CHEW Chew 2 tablets by mouth daily.  . furosemide (LASIX) 40 MG tablet TAKE 1 TABLET BY MOUTH EVERY DAY  . Hyaluronic Acid-Vitamin C (HYALURONIC ACID PO) Take 1 tablet by mouth daily. Women's Hyaluronic Acid  . meloxicam (MOBIC) 15 MG tablet Take 1 tablet (15 mg total) by mouth daily.  . Misc Natural Products (TART CHERRY ADVANCED PO) Take 60 mLs by mouth at bedtime.  . Multiple Vitamin (MULTIVITAMIN WITH MINERALS) TABS tablet Take 1 tablet by mouth daily. One-A-Day  . potassium chloride (K-DUR) 10 MEQ tablet Take 10 meq every  other day  . RESTASIS 0.05 % ophthalmic emulsion Place 1 drop into both eyes 2 (two) times daily.   . rosuvastatin (CRESTOR) 5 MG tablet TAKE 1 TABLET (5 MG TOTAL) BY MOUTH DAILY.  . sacubitril-valsartan (ENTRESTO) 97-103 MG Take 1 tablet by mouth 2 (two) times daily.  . TURMERIC CURCUMIN PO Take 1,000 mg by mouth daily.  . [DISCONTINUED] carvedilol (COREG) 12.5 MG tablet TAKE 1 TABLET BY MOUTH TWICE A DAY WITH A MEAL.     Allergies:   Atorvastatin; Diclofenac; and Morphine and related   Social History   Socioeconomic History  . Marital status: Married    Spouse name: Not on file  . Number of children: 3  . Years of education: Not on file  . Highest education level: Not on file  Occupational History  . Occupation: Retired  Scientific laboratory technician  . Financial resource strain: Not on file  .  Food insecurity:    Worry: Not on file    Inability: Not on file  . Transportation needs:    Medical: Not on file    Non-medical: Not on file  Tobacco Use  . Smoking status: Former Smoker    Packs/day: 1.00    Years: 20.00    Pack years: 20.00    Types: Cigarettes    Last attempt to quit: 05/06/1991    Years since quitting: 26.6  . Smokeless tobacco: Never Used  Substance and Sexual Activity  . Alcohol use: Yes    Alcohol/week: 7.0 standard drinks    Types: 7 Glasses of wine per week    Comment: occ  . Drug use: No  . Sexual activity: Yes    Birth control/protection: Surgical, Post-menopausal    Comment: partial hysterectomy  Lifestyle  . Physical activity:    Days per week: Not on file    Minutes per session: Not on file  . Stress: Not on file  Relationships  . Social connections:    Talks on phone: Not on file    Gets together: Not on file    Attends religious service: Not on file    Active member of club or organization: Not on file    Attends meetings of clubs or organizations: Not on file    Relationship status: Not on file  Other Topics Concern  . Not on file  Social History Narrative  . Not on file     Family History: The patient's family history includes Healthy in her brother and sister; Hyperlipidemia in her unknown relative; Hypertension in her unknown relative; Prostate cancer in her unknown relative. There is no history of Colon cancer. ROS:   Please see the history of present illness.     All other systems reviewed and are negative.  EKGs/Labs/Other Studies Reviewed:    The following studies were reviewed today:   EKG:   Recent Labs: 08/25/2017: NT-Pro BNP 388 09/11/2017: ALT 21; BUN 22; Creatinine, Ser 0.82; Hemoglobin 13.9; Platelets 235.0; Potassium 4.4; Sodium 143; TSH 0.77  Recent Lipid Panel    Component Value Date/Time   CHOL 197 09/11/2017 0814   TRIG 204.0 (H) 09/11/2017 0814   HDL 54.70 09/11/2017 0814   CHOLHDL 4 09/11/2017 0814    VLDL 40.8 (H) 09/11/2017 0814   LDLCALC 112 (H) 08/22/2016 0942   LDLDIRECT 113.0 09/11/2017 0814    Physical Exam: Blood pressure 120/76, pulse 83, height 5\' 1"  (1.549 m), weight 174 lb 12.8 oz (79.3 kg), SpO2 97 %.  GEN:  Well nourished, well  developed in no acute distress HEENT: Normal NECK: No JVD; No carotid bruits LYMPHATICS: No lymphadenopathy CARDIAC: RR, no murmurs, rubs, gallops RESPIRATORY:  Clear to auscultation without rales, wheezing or rhonchi  ABDOMEN: Soft, non-tender, non-distended MUSCULOSKELETAL:  No edema; No deformity  SKIN: Warm and dry NEUROLOGIC:  Alert and oriented x 3   ASSESSMENT:    1. Hyperlipidemia, unspecified hyperlipidemia type   2. Chronic systolic CHF (congestive heart failure) (HCC)    PLAN:    In order of problems listed above:  1. Chronic Systolic CHF Jonesha is doing quite well.  Her exercise capacity has improved dramatically.  Her heart rate is still a little higher than we would like.  I would like to increase the carvedilol to 25 mg twice a day.  I will have her see [name], PA in 2 to 3 months.  At that time if she appears to be at her maximal medical we will get an echocardiogram to evaluate her left ventricular systolic function.    2. Hyperlipidemia:      Recheck labs at her next visit with Rhonda   Medication Adjustments/Labs and Tests Ordered: Current medicines are reviewed at length with the patient today.  Concerns regarding medicines are outlined above.  Orders Placed This Encounter  Procedures  . Lipid Profile  . Basic Metabolic Panel (BMET)  . Hepatic function panel   Meds ordered this encounter  Medications  . carvedilol (COREG) 25 MG tablet    Sig: Take 1 tablet (25 mg total) by mouth 2 (two) times daily.    Dispense:  180 tablet    Refill:  3    Signed, Mertie Moores, MD  01/01/2018 5:15 PM    [facility]

## 2018-02-09 ENCOUNTER — Telehealth: Payer: Self-pay | Admitting: Family Medicine

## 2018-02-09 NOTE — Telephone Encounter (Signed)
Copied from [place] [id number]. Topic: General - Other >> Feb 09, 2018  8:33 AM Janace Aris A wrote: Reason for CRM: patient would like to be seen by her PCP today, says she would not like to see another provider, and will be going out of town so the date that I offered did not work for her.

## 2018-02-09 NOTE — Telephone Encounter (Signed)
Per Dr. Sarajane Jews, sounds viral based on information provided.   Pt should try OTC Imodium and drink plenty of water or Gatorade.  She should not take diuretic until completely over the virus.  If she is concerned then she may go to her local UC for work up.  Pt notified of all and agreed to plan.  No further action required.

## 2018-02-09 NOTE — Telephone Encounter (Signed)
Spoke to the pt.  She complains of diarrhea, stomach cramping and feeling jittery for 1 wk.  No nausea, vomiting, fever or chills.  Has HX of diverticulitis.  No complaints of pain today when pressing on abdomen.  Pt is taking a fluid pill for CHF PRN.  Pt states she is averaging 3 times per week.  No new medications.  Advised the pt not to take any further diuretic until authorized by Dr. Sarajane Jews as she may be dehydrated.  Instructed her to start hydrating while on the phone with her.  She is traveling out of town at 5 AM tomorrow to head to [place].  No availability on Dr. Barbie Banner schedule.

## 2018-02-15 DIAGNOSIS — M79641 Pain in right hand: Secondary | ICD-10-CM | POA: Diagnosis not present

## 2018-02-15 DIAGNOSIS — M13849 Other specified arthritis, unspecified hand: Secondary | ICD-10-CM | POA: Diagnosis not present

## 2018-02-15 DIAGNOSIS — M79642 Pain in left hand: Secondary | ICD-10-CM | POA: Diagnosis not present

## 2018-02-19 ENCOUNTER — Encounter: Payer: Self-pay | Admitting: Family Medicine

## 2018-02-19 ENCOUNTER — Ambulatory Visit (INDEPENDENT_AMBULATORY_CARE_PROVIDER_SITE_OTHER): Payer: PPO | Admitting: Family Medicine

## 2018-02-19 ENCOUNTER — Telehealth: Payer: Self-pay | Admitting: Gastroenterology

## 2018-02-19 VITALS — BP 120/82 | HR 63 | Temp 98.3°F | Wt 180.0 lb

## 2018-02-19 DIAGNOSIS — Z0000 Encounter for general adult medical examination without abnormal findings: Secondary | ICD-10-CM | POA: Diagnosis not present

## 2018-02-19 DIAGNOSIS — K580 Irritable bowel syndrome with diarrhea: Secondary | ICD-10-CM

## 2018-02-19 DIAGNOSIS — Z23 Encounter for immunization: Secondary | ICD-10-CM | POA: Diagnosis not present

## 2018-02-19 DIAGNOSIS — M5136 Other intervertebral disc degeneration, lumbar region: Secondary | ICD-10-CM | POA: Diagnosis not present

## 2018-02-19 DIAGNOSIS — M9902 Segmental and somatic dysfunction of thoracic region: Secondary | ICD-10-CM | POA: Diagnosis not present

## 2018-02-19 DIAGNOSIS — M9904 Segmental and somatic dysfunction of sacral region: Secondary | ICD-10-CM | POA: Diagnosis not present

## 2018-02-19 DIAGNOSIS — M5137 Other intervertebral disc degeneration, lumbosacral region: Secondary | ICD-10-CM | POA: Diagnosis not present

## 2018-02-19 DIAGNOSIS — M5135 Other intervertebral disc degeneration, thoracolumbar region: Secondary | ICD-10-CM | POA: Diagnosis not present

## 2018-02-19 DIAGNOSIS — M9903 Segmental and somatic dysfunction of lumbar region: Secondary | ICD-10-CM | POA: Diagnosis not present

## 2018-02-19 DIAGNOSIS — M5441 Lumbago with sciatica, right side: Secondary | ICD-10-CM | POA: Diagnosis not present

## 2018-02-19 NOTE — Telephone Encounter (Signed)
Spoke to pt because we received a referral from Dr. Sarajane Jews for direct colon. I advised that she is not due until 2025 after she had one done in 2015. Patient wants to make sure of this and wants a doctor to review.

## 2018-02-19 NOTE — Addendum Note (Signed)
Addended by: Elie Confer on: 02/19/2018 10:23 AM   Modules accepted: Orders

## 2018-02-19 NOTE — Telephone Encounter (Signed)
The patient has been notified of this information and all questions answered. The pt has been advised of the information and verbalized understanding.    

## 2018-02-19 NOTE — Progress Notes (Signed)
   Subjective:    Patient ID: Tina May, female    DOB: 1945-07-26, 72 y.o.   MRN: [id number]  HPI Here for several issues. First she has had right sided low back pain that radiates into the right thigh for a week. No numbness or weakness. She has seen [facility] in [facility] for this in the past and had good results. She is taking Flexeril and Ibuprofen prn. Also for several months she has had frequent BMs, averaging 4 a day. These were loose for a month or so, but lately have been more formed. No abdominal pain or fever. She has used probiotics in the past with excellent results but none lately. Of note in June and July she took a lot of antibiotics for a chronic sinus infection.    Review of Systems  Constitutional: Negative.   Respiratory: Negative.   Cardiovascular: Negative.   Gastrointestinal: Positive for abdominal distention and diarrhea. Negative for abdominal pain, anal bleeding, blood in stool, constipation, nausea, rectal pain and vomiting.  Genitourinary: Negative.   Musculoskeletal: Positive for back pain.  Neurological: Negative.        Objective:   Physical Exam  Constitutional: She appears well-developed and well-nourished.  Cardiovascular: Normal rate, regular rhythm, normal heart sounds and intact distal pulses.  Pulmonary/Chest: Effort normal and breath sounds normal.  Abdominal: Soft. Normal appearance and bowel sounds are normal. She exhibits no distension and no mass. There is no tenderness. There is no rebound and no guarding. No hernia.  Musculoskeletal:  Lower back is not tender, ROM is full           Assessment & Plan:  She probably has some bacterial overgrowth from the antibiotics, so I advised her to get back on the probiotics (she bought these at [facility]). As for the back pain, she will see the chiropractor again.  [name], MD

## 2018-02-26 DIAGNOSIS — M5137 Other intervertebral disc degeneration, lumbosacral region: Secondary | ICD-10-CM | POA: Diagnosis not present

## 2018-02-26 DIAGNOSIS — M9903 Segmental and somatic dysfunction of lumbar region: Secondary | ICD-10-CM | POA: Diagnosis not present

## 2018-02-26 DIAGNOSIS — M5136 Other intervertebral disc degeneration, lumbar region: Secondary | ICD-10-CM | POA: Diagnosis not present

## 2018-02-26 DIAGNOSIS — M9904 Segmental and somatic dysfunction of sacral region: Secondary | ICD-10-CM | POA: Diagnosis not present

## 2018-02-26 DIAGNOSIS — M5135 Other intervertebral disc degeneration, thoracolumbar region: Secondary | ICD-10-CM | POA: Diagnosis not present

## 2018-02-26 DIAGNOSIS — M9902 Segmental and somatic dysfunction of thoracic region: Secondary | ICD-10-CM | POA: Diagnosis not present

## 2018-03-04 DIAGNOSIS — M9904 Segmental and somatic dysfunction of sacral region: Secondary | ICD-10-CM | POA: Diagnosis not present

## 2018-03-04 DIAGNOSIS — M5135 Other intervertebral disc degeneration, thoracolumbar region: Secondary | ICD-10-CM | POA: Diagnosis not present

## 2018-03-04 DIAGNOSIS — M5137 Other intervertebral disc degeneration, lumbosacral region: Secondary | ICD-10-CM | POA: Diagnosis not present

## 2018-03-04 DIAGNOSIS — M5136 Other intervertebral disc degeneration, lumbar region: Secondary | ICD-10-CM | POA: Diagnosis not present

## 2018-03-04 DIAGNOSIS — M9902 Segmental and somatic dysfunction of thoracic region: Secondary | ICD-10-CM | POA: Diagnosis not present

## 2018-03-04 DIAGNOSIS — M9903 Segmental and somatic dysfunction of lumbar region: Secondary | ICD-10-CM | POA: Diagnosis not present

## 2018-03-12 DIAGNOSIS — Z85828 Personal history of other malignant neoplasm of skin: Secondary | ICD-10-CM | POA: Diagnosis not present

## 2018-03-12 DIAGNOSIS — M9902 Segmental and somatic dysfunction of thoracic region: Secondary | ICD-10-CM | POA: Diagnosis not present

## 2018-03-12 DIAGNOSIS — M9903 Segmental and somatic dysfunction of lumbar region: Secondary | ICD-10-CM | POA: Diagnosis not present

## 2018-03-12 DIAGNOSIS — M5136 Other intervertebral disc degeneration, lumbar region: Secondary | ICD-10-CM | POA: Diagnosis not present

## 2018-03-12 DIAGNOSIS — D485 Neoplasm of uncertain behavior of skin: Secondary | ICD-10-CM | POA: Diagnosis not present

## 2018-03-12 DIAGNOSIS — Z808 Family history of malignant neoplasm of other organs or systems: Secondary | ICD-10-CM | POA: Diagnosis not present

## 2018-03-12 DIAGNOSIS — M5135 Other intervertebral disc degeneration, thoracolumbar region: Secondary | ICD-10-CM | POA: Diagnosis not present

## 2018-03-12 DIAGNOSIS — C44319 Basal cell carcinoma of skin of other parts of face: Secondary | ICD-10-CM | POA: Diagnosis not present

## 2018-03-12 DIAGNOSIS — M5137 Other intervertebral disc degeneration, lumbosacral region: Secondary | ICD-10-CM | POA: Diagnosis not present

## 2018-03-12 DIAGNOSIS — L249 Irritant contact dermatitis, unspecified cause: Secondary | ICD-10-CM | POA: Diagnosis not present

## 2018-03-12 DIAGNOSIS — M9904 Segmental and somatic dysfunction of sacral region: Secondary | ICD-10-CM | POA: Diagnosis not present

## 2018-04-06 DIAGNOSIS — M9903 Segmental and somatic dysfunction of lumbar region: Secondary | ICD-10-CM | POA: Diagnosis not present

## 2018-04-06 DIAGNOSIS — M5135 Other intervertebral disc degeneration, thoracolumbar region: Secondary | ICD-10-CM | POA: Diagnosis not present

## 2018-04-06 DIAGNOSIS — M9904 Segmental and somatic dysfunction of sacral region: Secondary | ICD-10-CM | POA: Diagnosis not present

## 2018-04-06 DIAGNOSIS — M9902 Segmental and somatic dysfunction of thoracic region: Secondary | ICD-10-CM | POA: Diagnosis not present

## 2018-04-06 DIAGNOSIS — M5136 Other intervertebral disc degeneration, lumbar region: Secondary | ICD-10-CM | POA: Diagnosis not present

## 2018-04-06 DIAGNOSIS — M5137 Other intervertebral disc degeneration, lumbosacral region: Secondary | ICD-10-CM | POA: Diagnosis not present

## 2018-04-12 DIAGNOSIS — M5135 Other intervertebral disc degeneration, thoracolumbar region: Secondary | ICD-10-CM | POA: Diagnosis not present

## 2018-04-12 DIAGNOSIS — M5136 Other intervertebral disc degeneration, lumbar region: Secondary | ICD-10-CM | POA: Diagnosis not present

## 2018-04-12 DIAGNOSIS — M9903 Segmental and somatic dysfunction of lumbar region: Secondary | ICD-10-CM | POA: Diagnosis not present

## 2018-04-12 DIAGNOSIS — M5137 Other intervertebral disc degeneration, lumbosacral region: Secondary | ICD-10-CM | POA: Diagnosis not present

## 2018-04-12 DIAGNOSIS — M9902 Segmental and somatic dysfunction of thoracic region: Secondary | ICD-10-CM | POA: Diagnosis not present

## 2018-04-12 DIAGNOSIS — M9904 Segmental and somatic dysfunction of sacral region: Secondary | ICD-10-CM | POA: Diagnosis not present

## 2018-04-16 DIAGNOSIS — C44319 Basal cell carcinoma of skin of other parts of face: Secondary | ICD-10-CM | POA: Diagnosis not present

## 2018-04-17 ENCOUNTER — Other Ambulatory Visit: Payer: Self-pay | Admitting: Family Medicine

## 2018-04-19 ENCOUNTER — Encounter: Payer: Self-pay | Admitting: Physician Assistant

## 2018-04-19 ENCOUNTER — Ambulatory Visit: Payer: PPO | Admitting: Physician Assistant

## 2018-04-19 VITALS — BP 146/86 | HR 82 | Ht 62.0 in | Wt 177.6 lb

## 2018-04-19 DIAGNOSIS — E785 Hyperlipidemia, unspecified: Secondary | ICD-10-CM

## 2018-04-19 DIAGNOSIS — I5022 Chronic systolic (congestive) heart failure: Secondary | ICD-10-CM | POA: Diagnosis not present

## 2018-04-19 DIAGNOSIS — M5135 Other intervertebral disc degeneration, thoracolumbar region: Secondary | ICD-10-CM | POA: Diagnosis not present

## 2018-04-19 DIAGNOSIS — M9904 Segmental and somatic dysfunction of sacral region: Secondary | ICD-10-CM | POA: Diagnosis not present

## 2018-04-19 DIAGNOSIS — M9903 Segmental and somatic dysfunction of lumbar region: Secondary | ICD-10-CM | POA: Diagnosis not present

## 2018-04-19 DIAGNOSIS — M5136 Other intervertebral disc degeneration, lumbar region: Secondary | ICD-10-CM | POA: Diagnosis not present

## 2018-04-19 DIAGNOSIS — I428 Other cardiomyopathies: Secondary | ICD-10-CM | POA: Diagnosis not present

## 2018-04-19 DIAGNOSIS — M9902 Segmental and somatic dysfunction of thoracic region: Secondary | ICD-10-CM | POA: Diagnosis not present

## 2018-04-19 DIAGNOSIS — I10 Essential (primary) hypertension: Secondary | ICD-10-CM | POA: Diagnosis not present

## 2018-04-19 DIAGNOSIS — M5137 Other intervertebral disc degeneration, lumbosacral region: Secondary | ICD-10-CM | POA: Diagnosis not present

## 2018-04-19 LAB — COMPREHENSIVE METABOLIC PANEL
ALK PHOS: 70 IU/L (ref 39–117)
ALT: 33 IU/L — AB (ref 0–32)
AST: 25 IU/L (ref 0–40)
Albumin/Globulin Ratio: 2.2 (ref 1.2–2.2)
Albumin: 4.7 g/dL (ref 3.5–4.8)
BILIRUBIN TOTAL: 0.4 mg/dL (ref 0.0–1.2)
BUN / CREAT RATIO: 30 — AB (ref 12–28)
BUN: 27 mg/dL (ref 8–27)
CHLORIDE: 101 mmol/L (ref 96–106)
CO2: 25 mmol/L (ref 20–29)
Calcium: 9.6 mg/dL (ref 8.7–10.3)
Creatinine, Ser: 0.9 mg/dL (ref 0.57–1.00)
GFR calc Af Amer: 74 mL/min/{1.73_m2} (ref >59)
GFR calc non Af Amer: 64 mL/min/{1.73_m2} (ref >59)
GLUCOSE: 116 mg/dL — AB (ref 65–99)
Globulin, Total: 2.1 g/dL (ref 1.5–4.5)
Potassium: 4.4 mmol/L (ref 3.5–5.2)
Sodium: 144 mmol/L (ref 134–144)
Total Protein: 6.8 g/dL (ref 6.0–8.5)

## 2018-04-19 LAB — LIPID PANEL
CHOLESTEROL TOTAL: 210 mg/dL — AB (ref 100–199)
Chol/HDL Ratio: 3.3 ratio (ref 0.0–4.4)
HDL: 63 mg/dL (ref >39)
LDL Calculated: 117 mg/dL — ABNORMAL HIGH (ref 0–99)
Triglycerides: 152 mg/dL — ABNORMAL HIGH (ref 0–149)
VLDL CHOLESTEROL CAL: 30 mg/dL (ref 5–40)

## 2018-04-19 NOTE — Patient Instructions (Addendum)
Medication Instructions:  No changes. Able to use Lasix and Potassium as needed for swelling. If you need a refill on your cardiac medications before your next appointment, please call your pharmacy.   Lab work: CMET, LIPID today If you have labs (blood work) drawn today and your tests are completely normal, you will receive your results only by: [name] MyChart Message (if you have MyChart) OR . A paper copy in the mail If you have any lab test that is abnormal or we need to change your treatment, we will call you to review the results.  Testing/Procedures: Echocardiogram (schedule before the end of the year if possible) - Your physician has requested that you have an echocardiogram. Echocardiography is a painless test that uses sound waves to create images of your heart. It provides your doctor with information about the size and shape of your heart and how well your heart's chambers and valves are working. This procedure takes approximately one hour. There are no restrictions for this procedure. This will be performed at our [facility] location - [address].   Follow-Up: At [facility], you and your health needs are our priority.  As part of our continuing mission to provide you with exceptional heart care, we have created designated Provider Care Teams.  These Care Teams include your primary Cardiologist (physician) and Advanced Practice Providers (APPs -  Physician Assistants and Nurse Practitioners) who all work together to provide you with the care you need, when you need it. . Follow up with Dr.Nahser at the end of April.  Any Other Special Instructions Will Be Listed Below (If Applicable). Take BP daily at various times, BP range is 130/80 Start low sodium diet. Continue daily weights.

## 2018-04-19 NOTE — Progress Notes (Signed)
Cardiology Office Note   Date:  04/19/2018   ID:  Tina May, Tina May 1945-10-05, MRN [id number]  PCP:  [name], MD Cardiologist:  Mertie Moores, MD 01/01/2018 [name], PA-C 09/04/2017  No chief complaint on file.   History of Present Illness: Tina May is a 72 y.o. female with a history of asthma (mold is a trigger), cardiomyopathy (no CAD at cath 01/2017), S-CHF with an EF of 30-35 percent on 07/18/2018echo, HTN, HLD,pancreatic lesion(ok now, f/u CT in 12/2017 to confirm).  8/30 office visit, weight 174, Coreg increased, follow-up in 3 months and check echo plus lipids, then decide on ICD  Tina May presents for cardiology follow up.  She feels better when she is active. Has been limited recently because of the weather. Breathing is good, not waking up w/ LE edema. Denies orthopnea or PND. Good stamina, does not feel limited by her breathing at all.   Has not been taking the Lasix recently, except as needed. Has rarely needed it. Does not take the Kdur unless she takes the Lasix.   No asthma recently, has not had to use the inhaler.   Had skin cancers removed last week.   Has a dental infection that did not resolve w/ ABX, needs a root canal tomorrow. Is back on ABX now.   Wonders if the original dental infection affected her heart, she had it just before the initial CHF diagnosis.  She is fasting today and ready to get labs done.   Past Medical History:  Diagnosis Date  . Allergy   . Arthritis   . Asthma   . 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 Cataract    starting  . Diverticulosis   . External hemorrhoids   . Hepatic cyst   . Hyperlipidemia   . Hypertension   . Pancreatic lesion 03/11/13    Past Surgical History:  Procedure Laterality Date  . ABDOMINAL HYSTERECTOMY    . COLONOSCOPY  08-06-07   per Dr. [name], diverticulae only, repeat in 10 yrs   . LEFT HEART CATH AND CORONARY ANGIOGRAPHY N/A 01/27/2017   Procedure: LEFT HEART  CATH AND CORONARY ANGIOGRAPHY;  Surgeon: Martinique, Peter M, MD;  Location: [facility];  Service: Cardiovascular;  Laterality: N/A;  . SMALL INTESTINE SURGERY     from small intestine    Current Outpatient Medications  Medication Sig Dispense Refill  . albuterol (PROVENTIL HFA;VENTOLIN HFA) 108 (90 Base) MCG/ACT inhaler Inhale 2 puffs into the lungs every 4 (four) hours as needed for wheezing or shortness of breath. 3 Inhaler 3  . aspirin EC 81 MG tablet Take 81 mg by mouth daily.    . carvedilol (COREG) 25 MG tablet Take 1 tablet (25 mg total) by mouth 2 (two) times daily. 180 tablet 3  . cyclobenzaprine (FLEXERIL) 10 MG tablet Take 1 tablet (10 mg total) by mouth 3 (three) times daily as needed for muscle spasms. 60 tablet 2  . estradiol (ESTRACE) 0.5 MG tablet Take 0.5 mg by mouth once a week. For Menopausal hot flashes    . FIBER SELECT GUMMIES CHEW Chew 2 tablets by mouth daily.    . furosemide (LASIX) 40 MG tablet TAKE 1 TABLET BY MOUTH EVERY DAY 90 tablet 1  . Hyaluronic Acid-Vitamin C (HYALURONIC ACID PO) Take 1 tablet by mouth daily. Women's Hyaluronic Acid    . meloxicam (MOBIC) 15 MG tablet Take 1 tablet (15 mg total) by  mouth daily. 90 tablet 3  . Misc Natural Products (TART CHERRY ADVANCED PO) Take 60 mLs by mouth at bedtime.    . Multiple Vitamin (MULTIVITAMIN WITH MINERALS) TABS tablet Take 1 tablet by mouth daily. One-A-Day    . potassium chloride (K-DUR) 10 MEQ tablet Take 10 meq every other day 30 tablet 6  . RESTASIS 0.05 % ophthalmic emulsion Place 1 drop into both eyes 2 (two) times daily.     . rosuvastatin (CRESTOR) 5 MG tablet TAKE 1 TABLET (5 MG TOTAL) BY MOUTH DAILY. 90 tablet 3  . sacubitril-valsartan (ENTRESTO) 97-103 MG Take 1 tablet by mouth 2 (two) times daily. 60 tablet 11  . TURMERIC CURCUMIN PO Take 1,000 mg by mouth daily.     No current facility-administered medications for this visit.     Allergies:   Atorvastatin; Diclofenac; and Morphine and  related    Social History:  The patient  reports that she quit smoking about 26 years ago. Her smoking use included cigarettes. She has a 20.00 pack-year smoking history. She has never used smokeless tobacco. She reports current alcohol use of about 7.0 standard drinks of alcohol per week. She reports that she does not use drugs.   Family History:  The patient's family history includes Healthy in her brother and sister; Hyperlipidemia in her unknown relative; Hypertension in her unknown relative; Prostate cancer in her unknown relative.  She indicated that her mother is deceased. She indicated that her father is deceased. She indicated that her sister is alive. She indicated that her brother is alive. She indicated that her maternal grandmother is deceased. She indicated that her maternal grandfather is deceased. She indicated that her paternal grandmother is deceased. She indicated that her paternal grandfather is deceased. She indicated that the status of her neg hx is unknown.   ROS:  Please see the history of present illness. All other systems are reviewed and negative.    PHYSICAL EXAM: VS:  BP (!) 146/86   Pulse 82   Ht 5\' 2"  (1.575 m)   Wt 177 lb 9.6 oz (80.6 kg)   BMI 32.48 kg/m  , BMI Body mass index is 32.48 kg/m. GEN: Well nourished, well developed, female in no acute distress HEENT: normal for age  Neck: no JVD, no carotid bruit, no masses Cardiac: RRR; no murmur, no rubs, or gallops Respiratory:  clear to auscultation bilaterally, normal work of breathing GI: soft, nontender, nondistended, + BS MS: no deformity or atrophy; no edema; distal pulses are 2+ in all 4 extremities  Skin: warm and dry, no rash Neuro:  Strength and sensation are intact Psych: euthymic mood, full affect   EKG:  EKG is ordered today. The ekg ordered today demonstrates sinus rhythm, heart rate 82, no acute ischemic changes  ECHO: 11/19/2016 - Left ventricle: The cavity size was normal. There was  mild   concentric hypertrophy. Systolic function was moderately to   severely reduced. The estimated ejection fraction was in the   range of 30% to 35%. Diffuse hypokinesis. Doppler parameters are   consistent with abnormal left ventricular relaxation (grade 1   diastolic dysfunction). - Aortic valve: There was mild regurgitation. - Mitral valve: Calcified annulus. There was mild regurgitation. - Tricuspid valve: There was mild regurgitation. - Pericardium, extracardiac: A small, mostly posterior pericardial   effusion was identified circumferential to the heart.  Impressions:  - There has been mild improvement in EF since prior study.   CATH: 01/27/2017  Prox RCA  to Mid RCA lesion, 10 %stenosed.  There is mild left ventricular systolic dysfunction.  LV end diastolic pressure is normal.   1. No significant CAD 2. Mild LV dysfunction. EF is estimated at 45% 3. Normal LVEDP  Plan: continue medical therapy.    Recent Labs: 08/25/2017: NT-Pro BNP 388 09/11/2017: ALT 21; BUN 22; Creatinine, Ser 0.82; Hemoglobin 13.9; Platelets 235.0; Potassium 4.4; Sodium 143; TSH 0.77  CBC    Component Value Date/Time   WBC 5.0 09/11/2017 0814   RBC 4.33 09/11/2017 0814   HGB 13.9 09/11/2017 0814   HGB 13.8 01/26/2017 0903   HCT 41.5 09/11/2017 0814   HCT 41.1 01/26/2017 0903   PLT 235.0 09/11/2017 0814   PLT 274 01/26/2017 0903   MCV 95.7 09/11/2017 0814   MCV 92 01/26/2017 0903   MCH 30.8 01/26/2017 0903   MCH 31.1 10/27/2016 0507   MCHC 33.5 09/11/2017 0814   RDW 13.3 09/11/2017 0814   RDW 14.5 01/26/2017 0903   LYMPHSABS 1.3 09/11/2017 0814   MONOABS 0.5 09/11/2017 0814   EOSABS 0.3 09/11/2017 0814   BASOSABS 0.1 09/11/2017 0814   CMP Latest Ref Rng & Units 09/11/2017 08/25/2017 04/20/2017  Glucose 70 - 99 mg/dL 111(H) 110(H) 103(H)  BUN 6 - 23 mg/dL 22 23 21   Creatinine 0.40 - 1.20 mg/dL 0.82 0.85 0.91  Sodium 135 - 145 mEq/L 143 145(H) 141  Potassium 3.5 - 5.1 mEq/L  4.4 4.3 4.6  Chloride 96 - 112 mEq/L 104 100 100  CO2 19 - 32 mEq/L 31 28 27   Calcium 8.4 - 10.5 mg/dL 9.4 9.8 9.5  Total Protein 6.0 - 8.3 g/dL 6.7 - -  Total Bilirubin 0.2 - 1.2 mg/dL 0.5 - -  Alkaline Phos 39 - 117 U/L 50 - -  AST 0 - 37 U/L 20 - -  ALT 0 - 35 U/L 21 - -     Lipid Panel Lab Results  Component Value Date   CHOL 197 09/11/2017   HDL 54.70 09/11/2017   LDLCALC 112 (H) 08/22/2016   LDLDIRECT 113.0 09/11/2017   TRIG 204.0 (H) 09/11/2017   CHOLHDL 4 09/11/2017      Wt Readings from Last 3 Encounters:  04/19/18 177 lb 9.6 oz (80.6 kg)  02/19/18 180 lb (81.6 kg)  01/01/18 174 lb 12.8 oz (79.3 kg)     Other studies Reviewed: Additional studies/ records that were reviewed today include: Office notes, hospital records and testing.  ASSESSMENT AND PLAN:  1.  Chronic systolic CHF/nonischemic cardiomyopathy: - Volume status is good, okay to continue using Lasix and potassium as needed only. -Continue high-dose Coreg and Entresto. -She says her blood pressure does not run this high and is reluctant to add medications, hold off for now. - The next drug to add would be Spironolactone  2.  Hypertension: Blood pressure is elevated today and was still greater than 884 systolic on a recheck by me. -She is on high-dose Entresto and Coreg. - She believes that her blood pressure is up because of the stress recently of getting skin cancer removed and some other issues. - She is to check her blood pressure once a day, goal blood pressure less than 130/80. - She is to contact us if her blood pressure is consistently greater than 130/80.  We can then add Spironolactone 12.5-25 mg daily. 3.  Hyperlipidemia: Goal LDL is less than 70 - She has been on Crestor 5 mg daily.  Recheck lipids and a complete metabolic  profile today    She leaves for [place] just after the first of the year.  She will be there until late April.  However, she has a physician down there and can get blood  work done and get follow-up appointments as needed.   Current medicines are reviewed at length with the patient today.  The patient has concerns regarding medicines.  Concerns were addressed  The following changes have been made: Okay to continue Lasix and potassium as needed only  Labs/ tests ordered today include:   Orders Placed This Encounter  Procedures  . Comprehensive metabolic panel  . Lipid panel  . EKG 12-Lead  . ECHOCARDIOGRAM COMPLETE     Disposition:   FU with Mertie Moores, MD  Signed, [name], PA-C  04/19/2018 9:53 AM    [place] Phone: [phone]; Fax: [phone]

## 2018-04-20 ENCOUNTER — Telehealth: Payer: Self-pay | Admitting: Physician Assistant

## 2018-04-20 NOTE — Telephone Encounter (Signed)
Patient returning call for lab results. 

## 2018-04-20 NOTE — Telephone Encounter (Signed)
Discussed with patient and she will call back to let us know what she decides   Notes recorded by Harold Hedge, CMA on 04/20/2018 at 1:58 PM EST Left message for patient to contact office to discuss lab results. ------  Notes recorded by Lonn Georgia, PA-C on 04/20/2018 at 10:43 AM EST Please let her know that her cholesterol needs to come down. I know she did not tolerate Lipitor, if she is willing to try another one at a lower dose, we can do that. Otherwise, I will refer her to the Lipid clinic at [facility], the pharmacist can let her know other options. Otherwise, labs look pretty good except for fasting blood sugar of 116, need to watch the carbs. Thanks

## 2018-04-20 NOTE — Telephone Encounter (Signed)
Dr. Sarajane Jews please advise on refill of medication. thanks

## 2018-04-22 ENCOUNTER — Ambulatory Visit (HOSPITAL_COMMUNITY): Payer: PPO | Attending: Cardiology

## 2018-04-22 ENCOUNTER — Other Ambulatory Visit: Payer: Self-pay

## 2018-04-22 DIAGNOSIS — I428 Other cardiomyopathies: Secondary | ICD-10-CM | POA: Insufficient documentation

## 2018-04-26 DIAGNOSIS — L249 Irritant contact dermatitis, unspecified cause: Secondary | ICD-10-CM | POA: Diagnosis not present

## 2018-04-26 DIAGNOSIS — K130 Diseases of lips: Secondary | ICD-10-CM | POA: Diagnosis not present

## 2018-04-27 ENCOUNTER — Other Ambulatory Visit: Payer: Self-pay | Admitting: Family Medicine

## 2018-05-03 DIAGNOSIS — M9902 Segmental and somatic dysfunction of thoracic region: Secondary | ICD-10-CM | POA: Diagnosis not present

## 2018-05-03 DIAGNOSIS — M9904 Segmental and somatic dysfunction of sacral region: Secondary | ICD-10-CM | POA: Diagnosis not present

## 2018-05-03 DIAGNOSIS — M9903 Segmental and somatic dysfunction of lumbar region: Secondary | ICD-10-CM | POA: Diagnosis not present

## 2018-05-03 DIAGNOSIS — M5135 Other intervertebral disc degeneration, thoracolumbar region: Secondary | ICD-10-CM | POA: Diagnosis not present

## 2018-05-03 DIAGNOSIS — M5137 Other intervertebral disc degeneration, lumbosacral region: Secondary | ICD-10-CM | POA: Diagnosis not present

## 2018-05-03 DIAGNOSIS — M5136 Other intervertebral disc degeneration, lumbar region: Secondary | ICD-10-CM | POA: Diagnosis not present

## 2018-07-05 DIAGNOSIS — M9905 Segmental and somatic dysfunction of pelvic region: Secondary | ICD-10-CM | POA: Diagnosis not present

## 2018-07-05 DIAGNOSIS — M62838 Other muscle spasm: Secondary | ICD-10-CM | POA: Diagnosis not present

## 2018-07-05 DIAGNOSIS — M546 Pain in thoracic spine: Secondary | ICD-10-CM | POA: Diagnosis not present

## 2018-07-05 DIAGNOSIS — M9903 Segmental and somatic dysfunction of lumbar region: Secondary | ICD-10-CM | POA: Diagnosis not present

## 2018-07-05 DIAGNOSIS — M9902 Segmental and somatic dysfunction of thoracic region: Secondary | ICD-10-CM | POA: Diagnosis not present

## 2018-07-05 DIAGNOSIS — M545 Low back pain: Secondary | ICD-10-CM | POA: Diagnosis not present

## 2018-07-05 DIAGNOSIS — M9901 Segmental and somatic dysfunction of cervical region: Secondary | ICD-10-CM | POA: Diagnosis not present

## 2018-07-05 DIAGNOSIS — M542 Cervicalgia: Secondary | ICD-10-CM | POA: Diagnosis not present

## 2018-07-07 DIAGNOSIS — M62838 Other muscle spasm: Secondary | ICD-10-CM | POA: Diagnosis not present

## 2018-07-07 DIAGNOSIS — M9901 Segmental and somatic dysfunction of cervical region: Secondary | ICD-10-CM | POA: Diagnosis not present

## 2018-07-07 DIAGNOSIS — M9902 Segmental and somatic dysfunction of thoracic region: Secondary | ICD-10-CM | POA: Diagnosis not present

## 2018-07-07 DIAGNOSIS — M545 Low back pain: Secondary | ICD-10-CM | POA: Diagnosis not present

## 2018-07-07 DIAGNOSIS — M546 Pain in thoracic spine: Secondary | ICD-10-CM | POA: Diagnosis not present

## 2018-07-07 DIAGNOSIS — M9905 Segmental and somatic dysfunction of pelvic region: Secondary | ICD-10-CM | POA: Diagnosis not present

## 2018-07-07 DIAGNOSIS — M9903 Segmental and somatic dysfunction of lumbar region: Secondary | ICD-10-CM | POA: Diagnosis not present

## 2018-07-07 DIAGNOSIS — M542 Cervicalgia: Secondary | ICD-10-CM | POA: Diagnosis not present

## 2018-07-12 DIAGNOSIS — M546 Pain in thoracic spine: Secondary | ICD-10-CM | POA: Diagnosis not present

## 2018-07-12 DIAGNOSIS — M542 Cervicalgia: Secondary | ICD-10-CM | POA: Diagnosis not present

## 2018-07-12 DIAGNOSIS — M9902 Segmental and somatic dysfunction of thoracic region: Secondary | ICD-10-CM | POA: Diagnosis not present

## 2018-07-12 DIAGNOSIS — M9901 Segmental and somatic dysfunction of cervical region: Secondary | ICD-10-CM | POA: Diagnosis not present

## 2018-07-12 DIAGNOSIS — M9903 Segmental and somatic dysfunction of lumbar region: Secondary | ICD-10-CM | POA: Diagnosis not present

## 2018-07-12 DIAGNOSIS — M62838 Other muscle spasm: Secondary | ICD-10-CM | POA: Diagnosis not present

## 2018-07-12 DIAGNOSIS — M9905 Segmental and somatic dysfunction of pelvic region: Secondary | ICD-10-CM | POA: Diagnosis not present

## 2018-07-12 DIAGNOSIS — M545 Low back pain: Secondary | ICD-10-CM | POA: Diagnosis not present

## 2018-07-14 ENCOUNTER — Other Ambulatory Visit: Payer: Self-pay | Admitting: *Deleted

## 2018-07-15 ENCOUNTER — Other Ambulatory Visit: Payer: Self-pay

## 2018-07-16 NOTE — Telephone Encounter (Signed)
Pharmacy requesting refill on potassium; there is a difference in the sig. One has every day and the other has every other day. Please address. Thank you

## 2018-07-18 MED ORDER — POTASSIUM CHLORIDE ER 10 MEQ PO CPCR: 10.0000 meq | ORAL_CAPSULE | Freq: Every day | ORAL | 3 refills | Status: DC

## 2018-07-19 DIAGNOSIS — M546 Pain in thoracic spine: Secondary | ICD-10-CM | POA: Diagnosis not present

## 2018-07-19 DIAGNOSIS — M9902 Segmental and somatic dysfunction of thoracic region: Secondary | ICD-10-CM | POA: Diagnosis not present

## 2018-07-19 DIAGNOSIS — M9901 Segmental and somatic dysfunction of cervical region: Secondary | ICD-10-CM | POA: Diagnosis not present

## 2018-07-19 DIAGNOSIS — M545 Low back pain: Secondary | ICD-10-CM | POA: Diagnosis not present

## 2018-07-19 DIAGNOSIS — M542 Cervicalgia: Secondary | ICD-10-CM | POA: Diagnosis not present

## 2018-07-19 DIAGNOSIS — M9903 Segmental and somatic dysfunction of lumbar region: Secondary | ICD-10-CM | POA: Diagnosis not present

## 2018-07-19 DIAGNOSIS — M9905 Segmental and somatic dysfunction of pelvic region: Secondary | ICD-10-CM | POA: Diagnosis not present

## 2018-07-19 DIAGNOSIS — M62838 Other muscle spasm: Secondary | ICD-10-CM | POA: Diagnosis not present

## 2018-07-26 DIAGNOSIS — M9905 Segmental and somatic dysfunction of pelvic region: Secondary | ICD-10-CM | POA: Diagnosis not present

## 2018-07-26 DIAGNOSIS — J029 Acute pharyngitis, unspecified: Secondary | ICD-10-CM | POA: Diagnosis not present

## 2018-07-26 DIAGNOSIS — M542 Cervicalgia: Secondary | ICD-10-CM | POA: Diagnosis not present

## 2018-07-26 DIAGNOSIS — M9901 Segmental and somatic dysfunction of cervical region: Secondary | ICD-10-CM | POA: Diagnosis not present

## 2018-07-26 DIAGNOSIS — M546 Pain in thoracic spine: Secondary | ICD-10-CM | POA: Diagnosis not present

## 2018-07-26 DIAGNOSIS — M9903 Segmental and somatic dysfunction of lumbar region: Secondary | ICD-10-CM | POA: Diagnosis not present

## 2018-07-26 DIAGNOSIS — M9902 Segmental and somatic dysfunction of thoracic region: Secondary | ICD-10-CM | POA: Diagnosis not present

## 2018-07-26 DIAGNOSIS — M62838 Other muscle spasm: Secondary | ICD-10-CM | POA: Diagnosis not present

## 2018-07-26 DIAGNOSIS — M545 Low back pain: Secondary | ICD-10-CM | POA: Diagnosis not present

## 2018-08-02 DIAGNOSIS — M9905 Segmental and somatic dysfunction of pelvic region: Secondary | ICD-10-CM | POA: Diagnosis not present

## 2018-08-02 DIAGNOSIS — M62838 Other muscle spasm: Secondary | ICD-10-CM | POA: Diagnosis not present

## 2018-08-02 DIAGNOSIS — M545 Low back pain: Secondary | ICD-10-CM | POA: Diagnosis not present

## 2018-08-02 DIAGNOSIS — M546 Pain in thoracic spine: Secondary | ICD-10-CM | POA: Diagnosis not present

## 2018-08-02 DIAGNOSIS — M9902 Segmental and somatic dysfunction of thoracic region: Secondary | ICD-10-CM | POA: Diagnosis not present

## 2018-08-02 DIAGNOSIS — M9903 Segmental and somatic dysfunction of lumbar region: Secondary | ICD-10-CM | POA: Diagnosis not present

## 2018-08-02 DIAGNOSIS — M542 Cervicalgia: Secondary | ICD-10-CM | POA: Diagnosis not present

## 2018-08-02 DIAGNOSIS — M9901 Segmental and somatic dysfunction of cervical region: Secondary | ICD-10-CM | POA: Diagnosis not present

## 2018-08-06 DIAGNOSIS — M542 Cervicalgia: Secondary | ICD-10-CM | POA: Diagnosis not present

## 2018-08-06 DIAGNOSIS — M62838 Other muscle spasm: Secondary | ICD-10-CM | POA: Diagnosis not present

## 2018-08-06 DIAGNOSIS — M545 Low back pain: Secondary | ICD-10-CM | POA: Diagnosis not present

## 2018-08-06 DIAGNOSIS — M9905 Segmental and somatic dysfunction of pelvic region: Secondary | ICD-10-CM | POA: Diagnosis not present

## 2018-08-06 DIAGNOSIS — M9903 Segmental and somatic dysfunction of lumbar region: Secondary | ICD-10-CM | POA: Diagnosis not present

## 2018-08-06 DIAGNOSIS — M9901 Segmental and somatic dysfunction of cervical region: Secondary | ICD-10-CM | POA: Diagnosis not present

## 2018-08-06 DIAGNOSIS — M9902 Segmental and somatic dysfunction of thoracic region: Secondary | ICD-10-CM | POA: Diagnosis not present

## 2018-08-06 DIAGNOSIS — F312 Bipolar disorder, current episode manic severe with psychotic features: Secondary | ICD-10-CM | POA: Diagnosis not present

## 2018-08-06 DIAGNOSIS — M546 Pain in thoracic spine: Secondary | ICD-10-CM | POA: Diagnosis not present

## 2018-09-04 ENCOUNTER — Other Ambulatory Visit: Payer: Self-pay | Admitting: Cardiovascular Disease

## 2018-09-19 ENCOUNTER — Other Ambulatory Visit: Payer: Self-pay | Admitting: Family Medicine

## 2018-09-20 ENCOUNTER — Telehealth: Payer: Self-pay | Admitting: Physician Assistant

## 2018-09-20 ENCOUNTER — Encounter: Payer: Self-pay | Admitting: Cardiology

## 2018-09-20 ENCOUNTER — Telehealth: Payer: Self-pay | Admitting: Cardiovascular Disease

## 2018-09-20 ENCOUNTER — Other Ambulatory Visit: Payer: Self-pay

## 2018-09-20 ENCOUNTER — Ambulatory Visit (INDEPENDENT_AMBULATORY_CARE_PROVIDER_SITE_OTHER): Payer: PPO | Admitting: Cardiology

## 2018-09-20 VITALS — BP 146/90 | HR 76 | Ht 62.0 in | Wt 180.1 lb

## 2018-09-20 DIAGNOSIS — R002 Palpitations: Secondary | ICD-10-CM | POA: Diagnosis not present

## 2018-09-20 DIAGNOSIS — I10 Essential (primary) hypertension: Secondary | ICD-10-CM | POA: Diagnosis not present

## 2018-09-20 DIAGNOSIS — I5022 Chronic systolic (congestive) heart failure: Secondary | ICD-10-CM | POA: Diagnosis not present

## 2018-09-20 MED ORDER — AMLODIPINE BESYLATE 5 MG PO TABS: 5.0000 mg | ORAL_TABLET | Freq: Every day | ORAL | 3 refills | Status: DC

## 2018-09-20 NOTE — Progress Notes (Signed)
Cardiology Office Note:    Date:  09/20/2018   ID:  Tina May 1945-06-12, MRN [id number]  PCP:  Tina Morale, MD  Cardiologist:  Tina Moores, MD  Referring MD: Tina Morale, MD   Chief Complaint  Patient presents with   Palpitations    History of Present Illness:    Tina May is a 73 y.o. female with a past medical history significant for a history of asthma (mold is a trigger), cardiomyopathy (noCAD at cath 01/2017),S-CHF with an EF of 30-35 percent on 07/18/2018echo, HTN, HLD,pancreatic lesion(ok now, f/u CT in 12/2017 to confirm). She is treated with Coreg and Entresto. She takes lasix with KDur only when needed. Echo on 04/22/18 Showed EF 45-50%.   Tina May called into the office today with complaints of "feeling strange, heart pounding very hard and she was very concerned about it. No chest pain, volume status ok, has not taken Lasix in a couple of months. Has been working outside in the heat, denies orthostatic dizziness but feels a little weak." per telephone note. She was added onto my schedule for in office evaluation.   Tina May is here today alone. She is in good spirits. She has noted an intermittent fast, regular heart beat. She notes hearing her heart beat in her ears and more so at night, when it pounds. She denies any shortness of breath, chest discomfort, lightheadedness or syncope. One only one occasion she noted being really sweaty after walking up a flight of stairs and feeling her heart pound. She plays golf 3-4 times per week, using a golf cart, and does her own yardwork for 2 acres with a garden. She is very active. The heart pounding does not bether her at all while she is active, just when she is sitting still and laying in bed at night. She denies orthopnea, PND or edema.   Initially her symptoms were only a couple of times per week. She decreased her Entresto by half and her symptoms improved for a couple of days, but then returned and  now occurs on most days. She continues to take Half dose of Entresto. She says that she has not had any S/S of CHF like fluid retention.   She says that she is 10 lbs more in the last 2-3 months that she attributes to being at home with the COVID restrictions and eating too much. She says that she is sure it is not fluid. She has not had to take any lasix in at least 2 months. She says that she is very strict on limiting her salt intake.   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 [name], diverticulae only, repeat in 10 yrs    LEFT HEART CATH AND CORONARY ANGIOGRAPHY N/A 01/27/2017   Procedure: LEFT HEART CATH AND CORONARY ANGIOGRAPHY;  Surgeon: Martinique, Peter M, MD;  Location: [facility];  Service: Cardiovascular;  Laterality: N/A;   SMALL INTESTINE SURGERY     from small intestine    Current Medications:  Current Meds  Medication Sig   aspirin EC 81 MG tablet Take 81 mg by mouth daily.   carvedilol (COREG) 25 MG tablet Take 1 tablet (25 mg total) by mouth 2 (two) times daily.   cyclobenzaprine (FLEXERIL) 10 MG tablet TAKE 1 TABLET BY MOUTH THREE TIMES A DAY AS NEEDED FOR MUSCLE SPASMS   ENTRESTO 97-103 MG TAKE 1 TABLET BY MOUTH TWICE A DAY   estradiol (ESTRACE) 0.5 MG tablet Take 0.5 mg by mouth once a week. For Menopausal hot flashes   FIBER SELECT GUMMIES CHEW Chew 2 tablets by mouth daily.   Hyaluronic Acid-Vitamin C (HYALURONIC ACID PO) Take 1 tablet by mouth daily. Women's Hyaluronic Acid   meloxicam (MOBIC) 15 MG tablet Take 1 tablet (15 mg total) by mouth daily.   Misc Natural Products (TART CHERRY ADVANCED PO) Take 60 mLs  by mouth at bedtime.   Multiple Vitamin (MULTIVITAMIN WITH MINERALS) TABS tablet Take 1 tablet by mouth daily. One-A-Day   PROAIR HFA 108 (90 Base) MCG/ACT inhaler INHALE 2 PUFFS INTO THE LUNGS EVERY 4 (FOUR) HOURS AS NEEDED FOR WHEEZING OR SHORTNESS OF BREATH.   RESTASIS 0.05 % ophthalmic emulsion Place 1 drop into both eyes 2 (two) times daily.    rosuvastatin (CRESTOR) 5 MG tablet TAKE 1 TABLET (5 MG TOTAL) BY MOUTH DAILY.   TURMERIC CURCUMIN PO Take 1,000 mg by mouth daily.     Allergies:   Atorvastatin; Diclofenac; and Morphine and related   Social History   Socioeconomic History   Marital status: Married    Spouse name: Not on file   Number of children: 3   Years of education: Not on file   Highest education level: Not on file  Occupational History   Occupation: Retired  Scientist, product/process development strain: Not on file   Food insecurity:    Worry: Not on file    Inability: Not on file   Transportation needs:    Medical: Not on file    Non-medical: Not on file  Tobacco Use   Smoking status: Former Smoker    Packs/day: 1.00    Years: 20.00    Pack years: 20.00    Types: Cigarettes    Last attempt to quit: 05/06/1991    Years since quitting: 27.3   Smokeless tobacco: Never Used  Substance and Sexual Activity   Alcohol use: Yes    Alcohol/week: 7.0 standard drinks    Types: 7 Glasses of wine per week    Comment: occ   Drug use: No   Sexual activity: Yes    Birth control/protection: Surgical, Post-menopausal    Comment: partial hysterectomy  Lifestyle   Physical activity:    Days per week: Not on file    Minutes per session: Not on file   Stress: Not on file  Relationships   Social connections:    Talks on phone: Not on file    Gets together: Not on file    Attends religious service: Not on file    Active member of club or organization: Not on file    Attends meetings of clubs or organizations: Not on file    Relationship status: Not  on file  Other Topics Concern   Not on file  Social History Narrative   Not on file     Family History: The patient's family history includes Healthy in her brother and sister; Hyperlipidemia in her unknown relative; Hypertension in her unknown relative; Prostate cancer in her  unknown relative. There is no history of Colon cancer. ROS:   Please see the history of present illness.     All other systems reviewed and are negative.  EKGs/Labs/Other Studies Reviewed:    The following studies were reviewed today:  Echo 04/22/18  Study Conclusions - Left ventricle: The cavity size was normal. Wall thickness was   normal. Systolic function was mildly reduced. The estimated   ejection fraction was in the range of 45% to 50%. Diffuse   hypokinesis. Doppler parameters are consistent with abnormal left   ventricular relaxation (grade 1 diastolic dysfunction). - Aortic valve: There was mild regurgitation. - Mitral valve: Calcified annulus. There was mild regurgitation. - Left atrium: The atrium was mildly dilated. - Pericardium, extracardiac: A trivial pericardial effusion was   identified.  Impressions: - Mild global reduction in LV systolic function; mild diastolic   dysfunction; mild AI and MR; mild LAE.  LEFT HEART CATH AND CORONARY ANGIOGRAPHY 01/27/17  Conclusion    Prox RCA to Mid RCA lesion, 10 %stenosed.  There is mild left ventricular systolic dysfunction.  LV end diastolic pressure is normal.   1. No significant CAD 2. Mild LV dysfunction. EF is estimated at 45% 3. Normal LVEDP  Plan: continue medical therapy.   Stress echo 12/22/2016 Study Conclusions - Stress ECG conclusions: There were no stress arrhythmias or   conduction abnormalities. The stress ECG was normal.  Impressions: - Left ventricular systolic function did not improve from baseline,   suggesting poor myocardial reserve.   No regional wall motion abnormalities and no ischemic ECG changes    were seen following exercise.  Echo 11/19/16:  EF 30-35%  Echo 10/27/2016: EF 25-30%   EKG:  EKG is ordered today.  The ekg ordered today demonstrates NSR at 75 bpm, with no significant changes from prior.   Recent Labs: 04/19/2018: ALT 33; BUN 27; Creatinine, Ser 0.90; Potassium 4.4; Sodium 144   Recent Lipid Panel    Component Value Date/Time   CHOL 210 (H) 04/19/2018 0846   TRIG 152 (H) 04/19/2018 0846   HDL 63 04/19/2018 0846   CHOLHDL 3.3 04/19/2018 0846   CHOLHDL 4 09/11/2017 0814   VLDL 40.8 (H) 09/11/2017 0814   LDLCALC 117 (H) 04/19/2018 0846   LDLDIRECT 113.0 09/11/2017 0814    Physical Exam:    VS:  BP (!) 146/90    Pulse 76    Ht 5\' 2"  (1.575 m)    Wt 180 lb 1.9 oz (81.7 kg)    SpO2 96%    BMI 32.94 kg/m     Wt Readings from Last 3 Encounters:  09/20/18 180 lb 1.9 oz (81.7 kg)  04/19/18 177 lb 9.6 oz (80.6 kg)  02/19/18 180 lb (81.6 kg)     Physical Exam  Constitutional: She is oriented to person, place, and time. She appears well-developed and well-nourished. No distress.  HENT:  Head: Normocephalic and atraumatic.  Eyes: Pupils are equal, round, and reactive to light. Conjunctivae and EOM are normal.  Neck: Normal range of motion. Neck supple. No JVD present.  Cardiovascular: Normal rate, regular rhythm and intact distal pulses. Exam reveals no gallop and no friction rub.  No murmur heard. Pulmonary/Chest: Effort normal and breath sounds normal. No respiratory distress. She has no wheezes. She has no rales.  Abdominal: Soft. Bowel sounds are normal.  Musculoskeletal: Normal range of motion.        General: No deformity or edema.  Neurological: She is alert and oriented  to person, place, and time.  Skin: Skin is warm and dry.  Psychiatric: She has a normal mood and affect. Her behavior is normal. Judgment and thought content normal.  Vitals reviewed.   ASSESSMENT:    1. Chronic systolic CHF (congestive heart failure) (HCC)   2. Palpitations   3.  Benign essential HTN   4. Hyperlipidemia, unspecified hyperlipidemia type    PLAN:    In order of problems listed above:  1. Palpitations -Occurring for a couple of months. Fast, regular pounding noted mostly at rest. No chest discomfort, shortness of breath, lightheadedness. Is very active without any exertional symptoms. -Initiially improved with halving her Entresto dose (on her own) but returned after a couple of days. Continues to take half Entresto dose.  -EKG shows NSR at 75 bpm, QTC 484, no significant changes from prior.  -Will check labs including BMet for renal function and K+, magnesium and TSH.  -Will order cardiac monitor to evaluate for arrhythmias. Will have live monitor, as evaluate for VT given reduced EF.  -BP is elevated and could be contributing to the heart pounding sensaiton. WIll try to improve BP control.  2. Chronic systolic heart failure -EF found to be 25-30% in 10/2016. Cardiac cath showed no significant CAD. Last echo in 04/2018 showed EF 45-50% on medical therapy.  -Medical therapy includes carvedilol 25 mg bid, Entresto 97-103 mg 1/2 dose BID, lasix as needed.  -Pt denies any symptoms of volume overload. Has not had to take lasix in a couple of months.   3. Hypertension -On BB and Entresto (pt decreased her own dose) -BP elevated. Will add amlodipine 5 mg daily. Can consider adding spironolactone based on labs. Would only add 12.5 mg as pt is frequently active outside in the heat and could easily become dehydrated.    Medication Adjustments/Labs and Tests Ordered: Current medicines are reviewed at length with the patient today.  Concerns regarding medicines are outlined above. Labs and tests ordered and medication changes are outlined in the patient instructions below:  Patient Instructions  Medication Instructions:  START: Amlodipine 5 mg once a day   If you need a refill on your cardiac medications before your next appointment, please call your  pharmacy.   Lab work: TODAY: BMET, TSH & MAG   If you have labs (blood work) drawn today and your tests are completely normal, you will receive your results only by:  [facility] [place] (if you have MyChart) OR  A paper copy in the mail If you have any lab test that is abnormal or we need to change your treatment, we will call you to review the results.  Testing/Procedures: Your physician has recommended that you wear an event monitor. Event monitors are medical devices that record the hearts electrical activity. Doctors most often [place] these monitors to diagnose arrhythmias. Arrhythmias are problems with the speed or rhythm of the heartbeat. The monitor is a small, portable device. You can wear one while you do your normal daily activities. This is usually used to diagnose what is causing palpitations/syncope (passing out).    Follow-Up: At [facility], you and your health needs are our priority.  As part of our continuing mission to provide you with exceptional heart care, we have created designated Provider Care Teams.  These Care Teams include your primary Cardiologist (physician) and Advanced Practice Providers (APPs -  Physician Assistants and Nurse Practitioners) who all work together to provide you with the care you need, when you need it.  You will need a follow up appointment in:  3 months.  Please call our office 2 months in advance to schedule this appointment.  You may see Tina Moores, MD or one of the following Advanced Practice Providers on your designated Care Team: Richardson Dopp, PA-C [place], [place]  Daune Perch, NP  Any Other Special Instructions Will Be Listed Below (If Applicable).  DASH Eating Plan DASH stands for "Dietary Approaches to Stop Hypertension." The DASH eating plan is a healthy eating plan that has been shown to reduce high blood pressure (hypertension). It may also reduce your risk for type 2 diabetes, heart disease, and stroke. The DASH eating plan  may also help with weight loss. What are tips for following this plan?  General guidelines  Avoid eating more than 2,300 mg (milligrams) of salt (sodium) a day. If you have hypertension, you may need to reduce your sodium intake to 1,500 mg a day.  Limit alcohol intake to no more than 1 drink a day for nonpregnant women and 2 drinks a day for men. One drink equals 12 oz of beer, 5 oz of wine, or 1 oz of hard liquor.  Work with your health care provider to maintain a healthy body weight or to lose weight. Ask what an ideal weight is for you.  Get at least 30 minutes of exercise that causes your heart to beat faster (aerobic exercise) most days of the week. Activities may include walking, swimming, or biking.  Work with your health care provider or diet and nutrition specialist (dietitian) to adjust your eating plan to your individual calorie needs. Reading food labels   Check food labels for the amount of sodium per serving. Choose foods with less than 5 percent of the Daily Value of sodium. Generally, foods with less than 300 mg of sodium per serving fit into this eating plan.  To find whole grains, look for the word "whole" as the first word in the ingredient list. Shopping  Buy products labeled as "low-sodium" or "no salt added."  Buy fresh foods. Avoid canned foods and premade or frozen meals. Cooking  Avoid adding salt when cooking. Use salt-free seasonings or herbs instead of table salt or sea salt. Check with your health care provider or pharmacist before using salt substitutes.  Do not fry foods. Cook foods using healthy methods such as baking, boiling, grilling, and broiling instead.  Cook with heart-healthy oils, such as olive, canola, soybean, or sunflower oil. Meal planning  Eat a balanced diet that includes: ? 5 or more servings of fruits and vegetables each day. At each meal, try to fill half of your plate with fruits and vegetables. ? Up to 6-8 servings of whole  grains each day. ? Less than 6 oz of lean meat, poultry, or fish each day. A 3-oz serving of meat is about the same size as a deck of cards. One egg equals 1 oz. ? 2 servings of low-fat dairy each day. ? A serving of nuts, seeds, or beans 5 times each week. ? Heart-healthy fats. Healthy fats called Omega-3 fatty acids are found in foods such as flaxseeds and coldwater fish, like sardines, salmon, and mackerel.  Limit how much you eat of the following: ? Canned or prepackaged foods. ? Food that is high in trans fat, such as fried foods. ? Food that is high in saturated fat, such as fatty meat. ? Sweets, desserts, sugary drinks, and other foods with added sugar. ? Full-fat dairy products.  Do not salt foods before eating.  Try to eat at least 2 vegetarian meals each week.  Eat more home-cooked food and less restaurant, buffet, and fast food.  When eating at a restaurant, ask that your food be prepared with less salt or no salt, if possible. What foods are recommended? The items listed may not be a complete list. Talk with your dietitian about what dietary choices are best for you. Grains Whole-grain or whole-wheat bread. Whole-grain or whole-wheat pasta. Brown rice. [place]. Bulgur. Whole-grain and low-sodium cereals. Pita bread. Low-fat, low-sodium crackers. Whole-wheat flour tortillas. Vegetables Fresh or frozen vegetables (raw, steamed, roasted, or grilled). Low-sodium or reduced-sodium tomato and vegetable juice. Low-sodium or reduced-sodium tomato sauce and tomato paste. Low-sodium or reduced-sodium canned vegetables. Fruits All fresh, dried, or frozen fruit. Canned fruit in natural juice (without added sugar). Meat and other protein foods Skinless chicken or [place]. Ground chicken or [place]. Pork with fat trimmed off. Fish and seafood. Egg whites. Dried beans, peas, or lentils. Unsalted nuts, nut butters, and seeds. Unsalted canned beans. Lean cuts of beef with fat trimmed  off. Low-sodium, lean deli meat. Dairy Low-fat (1%) or fat-free (skim) milk. Fat-free, low-fat, or reduced-fat cheeses. Nonfat, low-sodium ricotta or cottage cheese. Low-fat or nonfat yogurt. Low-fat, low-sodium cheese. Fats and oils Soft margarine without trans fats. Vegetable oil. Low-fat, reduced-fat, or light mayonnaise and salad dressings (reduced-sodium). Canola, safflower, olive, soybean, and sunflower oils. Avocado. Seasoning and other foods Herbs. Spices. Seasoning mixes without salt. Unsalted popcorn and pretzels. Fat-free sweets. What foods are not recommended? The items listed may not be a complete list. Talk with your dietitian about what dietary choices are best for you. Grains Baked goods made with fat, such as croissants, muffins, or some breads. Dry pasta or rice meal packs. Vegetables Creamed or fried vegetables. Vegetables in a cheese sauce. Regular canned vegetables (not low-sodium or reduced-sodium). Regular canned tomato sauce and paste (not low-sodium or reduced-sodium). Regular tomato and vegetable juice (not low-sodium or reduced-sodium). Angie Fava. Olives. Fruits Canned fruit in a light or heavy syrup. Fried fruit. Fruit in cream or butter sauce. Meat and other protein foods Fatty cuts of meat. Ribs. Fried meat. Berniece Salines. Sausage. Bologna and other processed lunch meats. Salami. Fatback. Hotdogs. Bratwurst. Salted nuts and seeds. Canned beans with added salt. Canned or smoked fish. Whole eggs or egg yolks. Chicken or [place] with skin. Dairy Whole or 2% milk, cream, and half-and-half. Whole or full-fat cream cheese. Whole-fat or sweetened yogurt. Full-fat cheese. Nondairy creamers. Whipped toppings. Processed cheese and cheese spreads. Fats and oils Butter. Stick margarine. Lard. Shortening. Ghee. Bacon fat. Tropical oils, such as coconut, palm kernel, or palm oil. Seasoning and other foods Salted popcorn and pretzels. Onion salt, garlic salt, seasoned salt, table salt, and  sea salt. Worcestershire sauce. Tartar sauce. Barbecue sauce. Teriyaki sauce. Soy sauce, including reduced-sodium. Steak sauce. Canned and packaged gravies. Fish sauce. Oyster sauce. Cocktail sauce. Horseradish that you find on the shelf. Ketchup. Mustard. Meat flavorings and tenderizers. Bouillon cubes. Hot sauce and Tabasco sauce. Premade or packaged marinades. Premade or packaged taco seasonings. Relishes. Regular salad dressings. Where to find more information:  National Heart, Lung, and [place]: https://wilson-eaton.com/  American Heart Association: www.heart.org Summary  The DASH eating plan is a healthy eating plan that has been shown to reduce high blood pressure (hypertension). It may also reduce your risk for type 2 diabetes, heart disease, and stroke.  With the DASH eating plan, you should limit salt (sodium)  intake to 2,300 mg a day. If you have hypertension, you may need to reduce your sodium intake to 1,500 mg a day.  When on the DASH eating plan, aim to eat more fresh fruits and vegetables, whole grains, lean proteins, low-fat dairy, and heart-healthy fats.  Work with your health care provider or diet and nutrition specialist (dietitian) to adjust your eating plan to your individual calorie needs. This information is not intended to replace advice given to you by your health care provider. Make sure you discuss any questions you have with your health care provider. Document Released: 04/10/2011 Document Revised: 04/14/2016 Document Reviewed: 04/14/2016 Elsevier Interactive Patient Education  2019 [place], Daune Perch, NP  09/20/2018 11:07 AM    [place]

## 2018-09-20 NOTE — Telephone Encounter (Signed)
Spoke with [name] PA-C, she requested the patient be added to DOD schedule. Added the patient to DOD APP schedule. The patient expressed understanding.

## 2018-09-20 NOTE — Telephone Encounter (Signed)
Spoke with patient who states she is having increasing episodes of heart pounding, particularly when she is sitting still or trying to go to sleep. She denies that HR feels irregular; states it races at times and other times is at a normal rate but is easy to feel and hear. She stopped consuming caffeine as well as any other medications that she thought might be contributing such as her anti-inflammatory medications for back pain. Denies dehydration, states she has not taken furosemide in several weeks and is very careful to drink water throughout the day. Is very active playing golf and doing yard work several days per week. She denies SOB and chest pain.  I scheduled her to have a virtual visit with [name] tomorrow 5/19. [name], PA asked that patient be added to APP schedule today for EKG. I called patient back and she is aware of the appointment today and is appreciative of our help.

## 2018-09-20 NOTE — Telephone Encounter (Signed)
Pt called because she was feeling strange, heart pounding very hard and she was very concerned about it.  No chest pain, volume status ok, has not taken Lasix in a couple of months.   Has been working outside in the heat, denies orthostatic dizziness but feels a little weak.  Spoke w/ Linna Hoff, Dr Theodosia Blender nurse this am, he will call and offer her an appt.  [name], PA-C 09/20/2018 9:05 AM Beeper [phone]

## 2018-09-20 NOTE — Telephone Encounter (Signed)
°  Pt called and stated that she feels like her chest is pounding and beating very hard, and she can feel her heartbeat. She also feels like her heart starts beating very fast, and takes time to slow down. She has eliminated caffeine and chocolate from her diet. She is not having chest pain,  SOB, nor chest tightness.   She would like to come in to see [name] or an APP this week if possible. She does not want to go to the ED

## 2018-09-20 NOTE — Patient Instructions (Signed)
Medication Instructions:  START: Amlodipine 5 mg once a day   If you need a refill on your cardiac medications before your next appointment, please call your pharmacy.   Lab work: TODAY: BMET, TSH & MAG   If you have labs (blood work) drawn today and your tests are completely normal, you will receive your results only by:  [place] (if you have MyChart) OR  A paper copy in the mail If you have any lab test that is abnormal or we need to change your treatment, we will call you to review the results.  Testing/Procedures: Your physician has recommended that you wear an event monitor. Event monitors are medical devices that record the hearts electrical activity. Doctors most often [place] these monitors to diagnose arrhythmias. Arrhythmias are problems with the speed or rhythm of the heartbeat. The monitor is a small, portable device. You can wear one while you do your normal daily activities. This is usually used to diagnose what is causing palpitations/syncope (passing out).    Follow-Up: At [facility], you and your health needs are our priority.  As part of our continuing mission to provide you with exceptional heart care, we have created designated Provider Care Teams.  These Care Teams include your primary Cardiologist (physician) and Advanced Practice Providers (APPs -  Physician Assistants and Nurse Practitioners) who all work together to provide you with the care you need, when you need it. You will need a follow up appointment in:  3 months.  Please call our office 2 months in advance to schedule this appointment.  You may see Mertie Moores, MD or one of the following Advanced Practice Providers on your designated Care Team: Richardson Dopp, PA-C [place], [place]  Daune Perch, NP  Any Other Special Instructions Will Be Listed Below (If Applicable).  DASH Eating Plan DASH stands for "Dietary Approaches to Stop Hypertension." The DASH eating plan is a healthy eating plan that  has been shown to reduce high blood pressure (hypertension). It may also reduce your risk for type 2 diabetes, heart disease, and stroke. The DASH eating plan may also help with weight loss. What are tips for following this plan?  General guidelines  Avoid eating more than 2,300 mg (milligrams) of salt (sodium) a day. If you have hypertension, you may need to reduce your sodium intake to 1,500 mg a day.  Limit alcohol intake to no more than 1 drink a day for nonpregnant women and 2 drinks a day for men. One drink equals 12 oz of beer, 5 oz of wine, or 1 oz of hard liquor.  Work with your health care provider to maintain a healthy body weight or to lose weight. Ask what an ideal weight is for you.  Get at least 30 minutes of exercise that causes your heart to beat faster (aerobic exercise) most days of the week. Activities may include walking, swimming, or biking.  Work with your health care provider or diet and nutrition specialist (dietitian) to adjust your eating plan to your individual calorie needs. Reading food labels   Check food labels for the amount of sodium per serving. Choose foods with less than 5 percent of the Daily Value of sodium. Generally, foods with less than 300 mg of sodium per serving fit into this eating plan.  To find whole grains, look for the word "whole" as the first word in the ingredient list. Shopping  Buy products labeled as "low-sodium" or "no salt added."  Buy fresh foods.  Avoid canned foods and premade or frozen meals. Cooking  Avoid adding salt when cooking. Use salt-free seasonings or herbs instead of table salt or sea salt. Check with your health care provider or pharmacist before using salt substitutes.  Do not fry foods. Cook foods using healthy methods such as baking, boiling, grilling, and broiling instead.  Cook with heart-healthy oils, such as olive, canola, soybean, or sunflower oil. Meal planning  Eat a balanced diet that includes: ? 5  or more servings of fruits and vegetables each day. At each meal, try to fill half of your plate with fruits and vegetables. ? Up to 6-8 servings of whole grains each day. ? Less than 6 oz of lean meat, poultry, or fish each day. A 3-oz serving of meat is about the same size as a deck of cards. One egg equals 1 oz. ? 2 servings of low-fat dairy each day. ? A serving of nuts, seeds, or beans 5 times each week. ? Heart-healthy fats. Healthy fats called Omega-3 fatty acids are found in foods such as flaxseeds and coldwater fish, like sardines, salmon, and mackerel.  Limit how much you eat of the following: ? Canned or prepackaged foods. ? Food that is high in trans fat, such as fried foods. ? Food that is high in saturated fat, such as fatty meat. ? Sweets, desserts, sugary drinks, and other foods with added sugar. ? Full-fat dairy products.  Do not salt foods before eating.  Try to eat at least 2 vegetarian meals each week.  Eat more home-cooked food and less restaurant, buffet, and fast food.  When eating at a restaurant, ask that your food be prepared with less salt or no salt, if possible. What foods are recommended? The items listed may not be a complete list. Talk with your dietitian about what dietary choices are best for you. Grains Whole-grain or whole-wheat bread. Whole-grain or whole-wheat pasta. Brown rice. [place]row. Bulgur. Whole-grain and low-sodium cereals. Pita bread. Low-fat, low-sodium crackers. Whole-wheat flour tortillas. Vegetables Fresh or frozen vegetables (raw, steamed, roasted, or grilled). Low-sodium or reduced-sodium tomato and vegetable juice. Low-sodium or reduced-sodium tomato sauce and tomato paste. Low-sodium or reduced-sodium canned vegetables. Fruits All fresh, dried, or frozen fruit. Canned fruit in natural juice (without added sugar). Meat and other protein foods Skinless chicken or [place]. Ground chicken or [place]. Pork with fat trimmed off. Fish  and seafood. Egg whites. Dried beans, peas, or lentils. Unsalted nuts, nut butters, and seeds. Unsalted canned beans. Lean cuts of beef with fat trimmed off. Low-sodium, lean deli meat. Dairy Low-fat (1%) or fat-free (skim) milk. Fat-free, low-fat, or reduced-fat cheeses. Nonfat, low-sodium ricotta or cottage cheese. Low-fat or nonfat yogurt. Low-fat, low-sodium cheese. Fats and oils Soft margarine without trans fats. Vegetable oil. Low-fat, reduced-fat, or light mayonnaise and salad dressings (reduced-sodium). Canola, safflower, olive, soybean, and sunflower oils. Avocado. Seasoning and other foods Herbs. Spices. Seasoning mixes without salt. Unsalted popcorn and pretzels. Fat-free sweets. What foods are not recommended? The items listed may not be a complete list. Talk with your dietitian about what dietary choices are best for you. Grains Baked goods made with fat, such as croissants, muffins, or some breads. Dry pasta or rice meal packs. Vegetables Creamed or fried vegetables. Vegetables in a cheese sauce. Regular canned vegetables (not low-sodium or reduced-sodium). Regular canned tomato sauce and paste (not low-sodium or reduced-sodium). Regular tomato and vegetable juice (not low-sodium or reduced-sodium). Angie Fava. Olives. Fruits Canned fruit in a light or heavy  syrup. Maceo Pro fruit. Fruit in cream or butter sauce. Meat and other protein foods Fatty cuts of meat. Ribs. Fried meat. Berniece Salines. Sausage. Bologna and other processed lunch meats. Salami. Fatback. Hotdogs. Bratwurst. Salted nuts and seeds. Canned beans with added salt. Canned or smoked fish. Whole eggs or egg yolks. Chicken or [place] with skin. Dairy Whole or 2% milk, cream, and half-and-half. Whole or full-fat cream cheese. Whole-fat or sweetened yogurt. Full-fat cheese. Nondairy creamers. Whipped toppings. Processed cheese and cheese spreads. Fats and oils Butter. Stick margarine. Lard. Shortening. Ghee. Bacon fat. Tropical oils,  such as coconut, palm kernel, or palm oil. Seasoning and other foods Salted popcorn and pretzels. Onion salt, garlic salt, seasoned salt, table salt, and sea salt. Worcestershire sauce. Tartar sauce. Barbecue sauce. Teriyaki sauce. Soy sauce, including reduced-sodium. Steak sauce. Canned and packaged gravies. Fish sauce. Oyster sauce. Cocktail sauce. Horseradish that you find on the shelf. Ketchup. Mustard. Meat flavorings and tenderizers. Bouillon cubes. Hot sauce and Tabasco sauce. Premade or packaged marinades. Premade or packaged taco seasonings. Relishes. Regular salad dressings. Where to find more information:  National Heart, Lung, and [place]: https://wilson-eaton.com/  American Heart Association: www.heart.org Summary  The DASH eating plan is a healthy eating plan that has been shown to reduce high blood pressure (hypertension). It may also reduce your risk for type 2 diabetes, heart disease, and stroke.  With the DASH eating plan, you should limit salt (sodium) intake to 2,300 mg a day. If you have hypertension, you may need to reduce your sodium intake to 1,500 mg a day.  When on the DASH eating plan, aim to eat more fresh fruits and vegetables, whole grains, lean proteins, low-fat dairy, and heart-healthy fats.  Work with your health care provider or diet and nutrition specialist (dietitian) to adjust your eating plan to your individual calorie needs. This information is not intended to replace advice given to you by your health care provider. Make sure you discuss any questions you have with your health care provider. Document Released: 04/10/2011 Document Revised: 04/14/2016 Document Reviewed: 04/14/2016 Elsevier Interactive Patient Education  2019 [employer].

## 2018-09-21 ENCOUNTER — Telehealth: Payer: Self-pay | Admitting: Radiology

## 2018-09-21 ENCOUNTER — Telehealth: Payer: PPO | Admitting: Cardiovascular Disease

## 2018-09-21 LAB — BASIC METABOLIC PANEL
BUN/Creatinine Ratio: 27 (ref 12–28)
BUN: 23 mg/dL (ref 8–27)
CO2: 24 mmol/L (ref 20–29)
Calcium: 9.5 mg/dL (ref 8.7–10.3)
Chloride: 102 mmol/L (ref 96–106)
Creatinine, Ser: 0.86 mg/dL (ref 0.57–1.00)
GFR calc Af Amer: 78 mL/min/{1.73_m2} (ref >59)
GFR calc non Af Amer: 67 mL/min/{1.73_m2} (ref >59)
Glucose: 109 mg/dL — ABNORMAL HIGH (ref 65–99)
Potassium: 4.5 mmol/L (ref 3.5–5.2)
Sodium: 139 mmol/L (ref 134–144)

## 2018-09-21 LAB — MAGNESIUM: Magnesium: 2.1 mg/dL (ref 1.6–2.3)

## 2018-09-21 LAB — TSH: TSH: 0.804 u[IU]/mL (ref 0.450–4.500)

## 2018-09-21 NOTE — Telephone Encounter (Signed)
Enrolled patient for a 30 day [employer] monitor to be be mailed. Brief instructions were gone over with the patient and she knows to expect the monitor to arrive in 3-4 days.

## 2018-09-22 ENCOUNTER — Ambulatory Visit: Payer: PPO | Admitting: Cardiology

## 2018-09-22 ENCOUNTER — Telehealth: Payer: Self-pay

## 2018-09-22 DIAGNOSIS — I10 Essential (primary) hypertension: Secondary | ICD-10-CM

## 2018-09-22 MED ORDER — SPIRONOLACTONE 25 MG PO TABS: 12.0000 mg | ORAL_TABLET | Freq: Every day | ORAL | 3 refills | Status: DC

## 2018-09-22 NOTE — Telephone Encounter (Signed)
ORDERS

## 2018-09-24 NOTE — Telephone Encounter (Signed)
Patient is calling checking status of medication meloxicam (MOBIC) 15 MG tablet . Dr.Fry is out of the office, Can someone please advise.  Patient call back # [phone]

## 2018-09-25 ENCOUNTER — Ambulatory Visit (INDEPENDENT_AMBULATORY_CARE_PROVIDER_SITE_OTHER): Payer: PPO

## 2018-09-25 ENCOUNTER — Other Ambulatory Visit: Payer: Self-pay | Admitting: Family Medicine

## 2018-09-25 DIAGNOSIS — R002 Palpitations: Secondary | ICD-10-CM

## 2018-09-28 NOTE — Telephone Encounter (Signed)
Call in [place] #[phone] with 5 rf, also Meloxicam #90 with 3 rf

## 2018-09-29 MED ORDER — MELOXICAM 15 MG PO TABS: 15.0000 mg | ORAL_TABLET | Freq: Every day | ORAL | 3 refills | Status: DC

## 2018-09-29 MED ORDER — ROSUVASTATIN CALCIUM 5 MG PO TABS: ORAL_TABLET | ORAL | 3 refills | Status: DC

## 2018-09-29 NOTE — Addendum Note (Signed)
Addended by: Beckie Busing on: 09/29/2018 08:42 AM   Modules accepted: Orders

## 2018-09-29 NOTE — Telephone Encounter (Signed)
Spoke with pt and she states she does not need the Flexeril. Just Crestor and Meloxicam. Pt also not sure when she needs to schedule follow up.   Rx's sent. Please advise on follow up.

## 2018-09-30 ENCOUNTER — Telehealth: Payer: Self-pay | Admitting: *Deleted

## 2018-09-30 ENCOUNTER — Telehealth: Payer: Self-pay | Admitting: Cardiology

## 2018-09-30 NOTE — Telephone Encounter (Signed)
[employer] called at [phone].  Their records indicate patient called in 09/29/18 regarding irritation to skin.  Was offered sensitive skin replacement but deferred just to remove monitor until irritation subsided.  [employer] will reach out to patient today to arrange for sensitive skin replacement.

## 2018-09-30 NOTE — Telephone Encounter (Signed)
Follow up    Patient is calling because she had a monitor sent to her and it did not work because the adhesive irritated her skin. She was then told that she could receive another type of monitor. She is calling to check on the status of the new monitor. Please call to discuss.

## 2018-10-05 ENCOUNTER — Telehealth: Payer: Self-pay | Admitting: Radiology

## 2018-10-05 NOTE — Telephone Encounter (Signed)
Called patient who is having issues with her monitor. 1st they forgot to send sensitive skin patches and she had a massive breakout in the 1st 24 hours wearing the monitor. Then they sent her new patches overnight but they took 4 days to get there and would not transmit on top of her broken out skin. She called [employer] and they are sending her a different version than the standard event monitor. She has yet to receive it but I called [employer] and verified the new monitor is being overnighted. If she does not receive the monitor she will call me or the monitor company to get tracking info.

## 2018-10-13 ENCOUNTER — Telehealth: Payer: Self-pay

## 2018-10-13 NOTE — Telephone Encounter (Signed)
Dr. Fry please advise. Thanks  

## 2018-10-13 NOTE — Telephone Encounter (Signed)
Pt calling about continued back pain, legs feel very weak. She would like to know if you need to see her in the office or if you can refer her to a specialist.

## 2018-10-14 NOTE — Telephone Encounter (Signed)
I have called and lmom for the pt to call back to set up in office visit per Dr. Sarajane Jews.   Thanks

## 2018-10-14 NOTE — Telephone Encounter (Signed)
Set up an in person OV  

## 2018-10-17 ENCOUNTER — Other Ambulatory Visit: Payer: Self-pay | Admitting: Cardiovascular Disease

## 2018-10-18 ENCOUNTER — Telehealth: Payer: Self-pay | Admitting: *Deleted

## 2018-10-18 NOTE — Telephone Encounter (Signed)
Keep on same meds     Avoid caffeine.   Avoid alcohol   Avoid other stimulants

## 2018-10-18 NOTE — Telephone Encounter (Signed)
Spoke with patient. She was awake, laying down, felt the 6 beat run of vtach. It is exactly what she was feeling earlier and the reason the monitor was ordered. She did not record symptoms.  She mostly feels those at nighttime, not during day.  Its the first one she felt since wearing the monitor.  Reviewed with Dr. Angelena Form. No new orders at this time.  Continue to monitor.

## 2018-10-18 NOTE — Telephone Encounter (Signed)
Patient is returning call.  °

## 2018-10-18 NOTE — Telephone Encounter (Signed)
Received report from [employer] Day 22 - Serious Auto triggered SR w run of V-tach (6 beats)/PACs This was recorded on 10/16/18 at 1:42 am CST.   Left message for patient to call back to find out what she was doing, if she felt anything.

## 2018-10-19 ENCOUNTER — Encounter: Payer: Self-pay | Admitting: Family Medicine

## 2018-10-19 ENCOUNTER — Ambulatory Visit (INDEPENDENT_AMBULATORY_CARE_PROVIDER_SITE_OTHER): Payer: PPO | Admitting: Family Medicine

## 2018-10-19 ENCOUNTER — Other Ambulatory Visit: Payer: Self-pay

## 2018-10-19 VITALS — BP 128/80 | HR 66 | Temp 97.7°F | Wt 176.4 lb

## 2018-10-19 DIAGNOSIS — M5442 Lumbago with sciatica, left side: Secondary | ICD-10-CM

## 2018-10-19 DIAGNOSIS — M5441 Lumbago with sciatica, right side: Secondary | ICD-10-CM

## 2018-10-19 DIAGNOSIS — G8929 Other chronic pain: Secondary | ICD-10-CM | POA: Diagnosis not present

## 2018-10-19 DIAGNOSIS — K5732 Diverticulitis of large intestine without perforation or abscess without bleeding: Secondary | ICD-10-CM

## 2018-10-19 MED ORDER — METRONIDAZOLE 500 MG PO TABS: 500.0000 mg | ORAL_TABLET | Freq: Three times a day (TID) | ORAL | 0 refills | Status: DC

## 2018-10-19 MED ORDER — CIPROFLOXACIN HCL 500 MG PO TABS: 500.0000 mg | ORAL_TABLET | Freq: Two times a day (BID) | ORAL | 0 refills | Status: DC

## 2018-10-19 NOTE — Progress Notes (Signed)
   Subjective:    Patient ID: Tina May, female    DOB: [birth year], 73 y.o.   MRN: [id number]  HPI Here for several issues. First she thinks she has another episode of diverticulitis. For 2 weeks she has had LLQ pain with diarrhea, no blood seen, no fever. Also her low back pain has steadily gotten worse over the past year, and the pain radiates down both legs. She also has weakness in both legs, and she often has to sit down and rest if she walks long distances or goes up steps. No numbness or tingling. She takes Meloxicam daily.    Review of Systems  Constitutional: Negative.   Respiratory: Negative.   Cardiovascular: Negative.   Gastrointestinal: Positive for abdominal pain and diarrhea. Negative for abdominal distention, anal bleeding, blood in stool, constipation, nausea, rectal pain and vomiting.  Genitourinary: Negative.   Musculoskeletal: Positive for back pain.  Neurological: Positive for weakness. Negative for numbness.       Objective:   Physical Exam Constitutional:      General: She is not in acute distress.    Appearance: Normal appearance.  Cardiovascular:     Rate and Rhythm: Normal rate and regular rhythm.     Pulses: Normal pulses.     Heart sounds: Normal heart sounds.  Pulmonary:     Effort: Pulmonary effort is normal. No respiratory distress.     Breath sounds: Normal breath sounds. No stridor. No wheezing, rhonchi or rales.  Abdominal:     General: Abdomen is flat. Bowel sounds are normal. There is no distension.     Palpations: Abdomen is soft. There is no mass.     Tenderness: There is no guarding or rebound.     Hernia: No hernia is present.     Comments: Mildly tender in the LLQ   Musculoskeletal:     Comments: The lower back is not tender, ROM is limited by stiffness, negative SLR   Neurological:     Mental Status: She is alert.           Assessment & Plan:  For the diverticulitis, we will treat as usual with Cipro and Flagyl. Given her  low back pain and leg weakness, I suspect she has some element of lumbar stenosis. To assess this we will set up a lumbar MRI scan.  [name], MD

## 2018-10-19 NOTE — Telephone Encounter (Signed)
Called patient and informed her of Dr. Harrington Challenger' recommendations. Patient verbalized understanding.

## 2018-10-20 ENCOUNTER — Other Ambulatory Visit: Payer: PPO

## 2018-10-20 ENCOUNTER — Other Ambulatory Visit: Payer: Self-pay | Admitting: *Deleted

## 2018-10-20 DIAGNOSIS — I10 Essential (primary) hypertension: Secondary | ICD-10-CM | POA: Diagnosis not present

## 2018-10-20 NOTE — Telephone Encounter (Signed)
Pt already has had her appt

## 2018-10-21 ENCOUNTER — Telehealth: Payer: Self-pay | Admitting: *Deleted

## 2018-10-21 ENCOUNTER — Other Ambulatory Visit: Payer: Self-pay

## 2018-10-21 ENCOUNTER — Encounter: Payer: Self-pay | Admitting: Family Medicine

## 2018-10-21 ENCOUNTER — Ambulatory Visit (INDEPENDENT_AMBULATORY_CARE_PROVIDER_SITE_OTHER): Payer: PPO | Admitting: Family Medicine

## 2018-10-21 DIAGNOSIS — R197 Diarrhea, unspecified: Secondary | ICD-10-CM | POA: Diagnosis not present

## 2018-10-21 DIAGNOSIS — R1084 Generalized abdominal pain: Secondary | ICD-10-CM | POA: Diagnosis not present

## 2018-10-21 DIAGNOSIS — R1013 Epigastric pain: Secondary | ICD-10-CM | POA: Diagnosis not present

## 2018-10-21 LAB — BASIC METABOLIC PANEL
BUN/Creatinine Ratio: 27 (ref 12–28)
BUN: 26 mg/dL (ref 8–27)
CO2: 27 mmol/L (ref 20–29)
Calcium: 9.7 mg/dL (ref 8.7–10.3)
Chloride: 104 mmol/L (ref 96–106)
Creatinine, Ser: 0.96 mg/dL (ref 0.57–1.00)
GFR calc Af Amer: 68 mL/min/{1.73_m2} (ref >59)
GFR calc non Af Amer: 59 mL/min/{1.73_m2} — ABNORMAL LOW (ref >59)
Glucose: 91 mg/dL (ref 65–99)
Potassium: 4.5 mmol/L (ref 3.5–5.2)
Sodium: 144 mmol/L (ref 134–144)

## 2018-10-21 NOTE — Telephone Encounter (Signed)
Copied from [place] [id number]. Topic: General - Other >> Oct 21, 2018  8:10 AM Keene Breath wrote: Reason for CRM: Patient called to speak with the nurse regarding her diverticulitis.  She stated that the medicine is not helping and she feels worse.  Please call patient and advise.  CB# [phone]  Clinic RN spoke with patient. Patient reports diarrhea is worse and its now clear colored. Patient reports her cardiologist put her two new medications and she thinks that maybe the problem. Virtual visit scheduled with Dr. Maudie Mercury. Patient does not want to go to ED d/t being high risk.

## 2018-10-21 NOTE — Progress Notes (Signed)
Virtual Visit via Video Note  I connected with Tina May  on 10/21/18 at  4:20 PM EDT by a video enabled telemedicine application and verified that I am speaking with the correct person using two identifiers.  Location patient: home Location provider:work or home office Persons participating in the virtual visit: patient, provider  I discussed the limitations of evaluation and management by telemedicine and the availability of in person appointments. The patient expressed understanding and agreed to proceed.   HPI:  Acute visit for abdominal issues/diarrhea: -started 3 weeks ago and then worsened -has a history of diverticulitis -she was having soft stools, gurgling in stomach and bilat lower abdomen discomfort, presumed diverticulitis at appt on 6/16 with PCP and was started on cipro and flagyl -reports has worsened since starting the abx, wonders if they are making her sick - now with about 3-5 episodes of watery diarrhea daily, worsened intermittent stomach cramps, headache, gurgling -has been doing the keto diet, is drinking a lot of water, eating several times per day -two other new meds: spironolactone, amlodipine - started 3 weeks ago -BP 123/68, P 77 - this is normal -denies dizziness, persistent pain, hematochezia, melena, nausea, vomiting, dysuria, feeling weak, fevers, any resp symptoms, cough, sore throat, rash, sick exposures, persistent or focal abd pain -reports has been pretty isolated  ROS: See pertinent positives and negatives per HPI.  Past Medical History:  Diagnosis Date  . Allergy   . Arthritis   . Asthma   . 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 Cataract    starting  . Diverticulosis   . External hemorrhoids   . Hepatic cyst   . Hyperlipidemia   . Hypertension   . Pancreatic lesion 03/11/13    Past Surgical History:  Procedure Laterality Date  . ABDOMINAL HYSTERECTOMY    . COLONOSCOPY  08-06-07   per Dr. [name], diverticulae only, repeat in 10 yrs   .  LEFT HEART CATH AND CORONARY ANGIOGRAPHY N/A 01/27/2017   Procedure: LEFT HEART CATH AND CORONARY ANGIOGRAPHY;  Surgeon: Martinique, Peter M, MD;  Location: [facility];  Service: Cardiovascular;  Laterality: N/A;  . SMALL INTESTINE SURGERY     from small intestine    Family History  Problem Relation Age of Onset  . Hyperlipidemia Unknown   . Hypertension Unknown   . Prostate cancer Unknown   . Healthy Sister   . Healthy Brother   . Colon cancer Neg Hx     SOCIAL HX: see hpi   Current Outpatient Medications:  .  amLODipine (NORVASC) 5 MG tablet, Take 1 tablet (5 mg total) by mouth daily., Disp: 90 tablet, Rfl: 3 .  aspirin EC 81 MG tablet, Take 81 mg by mouth daily., Disp: , Rfl:  .  carvedilol (COREG) 25 MG tablet, Take 1 tablet (25 mg total) by mouth 2 (two) times daily., Disp: 180 tablet, Rfl: 3 .  ciprofloxacin (CIPRO) 500 MG tablet, Take 1 tablet (500 mg total) by mouth 2 (two) times daily., Disp: 20 tablet, Rfl: 0 .  cyclobenzaprine (FLEXERIL) 10 MG tablet, TAKE 1 TABLET BY MOUTH THREE TIMES A DAY AS NEEDED FOR MUSCLE SPASMS, Disp: 60 tablet, Rfl: 5 .  ENTRESTO 97-103 MG, TAKE 1 TABLET BY MOUTH TWICE A DAY, Disp: 60 tablet, Rfl: 6 .  estradiol (ESTRACE) 0.5 MG tablet, Take 0.5 mg by mouth once a week. For Menopausal hot flashes, Disp: , Rfl:  .  FIBER SELECT GUMMIES CHEW, Chew 2 tablets  by mouth daily., Disp: , Rfl:  .  furosemide (LASIX) 40 MG tablet, TAKE 1 TABLET BY MOUTH EVERY DAY, Disp: 90 tablet, Rfl: 3 .  Hyaluronic Acid-Vitamin C (HYALURONIC ACID PO), Take 1 tablet by mouth daily. Women's Hyaluronic Acid, Disp: , Rfl:  .  meloxicam (MOBIC) 15 MG tablet, Take 1 tablet (15 mg total) by mouth daily., Disp: 90 tablet, Rfl: 3 .  metroNIDAZOLE (FLAGYL) 500 MG tablet, Take 1 tablet (500 mg total) by mouth 3 (three) times daily., Disp: 30 tablet, Rfl: 0 .  Misc Natural Products (TART CHERRY ADVANCED PO), Take 60 mLs by mouth at bedtime., Disp: , Rfl:  .  Multiple Vitamin  (MULTIVITAMIN WITH MINERALS) TABS tablet, Take 1 tablet by mouth daily. One-A-Day, Disp: , Rfl:  .  potassium chloride (MICRO-K) 10 MEQ CR capsule, Take 1 capsule (10 mEq total) by mouth daily., Disp: 90 capsule, Rfl: 3 .  PROAIR HFA 108 (90 Base) MCG/ACT inhaler, INHALE 2 PUFFS INTO THE LUNGS EVERY 4 (FOUR) HOURS AS NEEDED FOR WHEEZING OR SHORTNESS OF BREATH., Disp: 25.5 Inhaler, Rfl: 3 .  RESTASIS 0.05 % ophthalmic emulsion, Place 1 drop into both eyes 2 (two) times daily. , Disp: , Rfl:  .  rosuvastatin (CRESTOR) 5 MG tablet, TAKE 1 TABLET (5 MG TOTAL) BY MOUTH DAILY., Disp: 90 tablet, Rfl: 3 .  spironolactone (ALDACTONE) 25 MG tablet, Take 0.5 tablets (12.5 mg total) by mouth daily., Disp: 45 tablet, Rfl: 3 .  TURMERIC CURCUMIN PO, Take 1,000 mg by mouth daily., Disp: , Rfl:   EXAM:  VITALS per patient if applicable:denies fever (does not have thermometer) -BP 123/68, P 77 - this is normal for her per her report  GENERAL: alert, oriented, appears well and in no acute distress  HEENT: atraumatic, conjunttiva clear, no obvious abnormalities on inspection of external nose and ears  NECK: normal movements of the head and neck  LUNGS: on inspection no signs of respiratory distress, breathing rate appears normal, no obvious gross SOB, gasping or wheezing  ABD: she denies any TTP on self exam of the abd   CV: no obvious cyanosis  MS: moves all visible extremities without noticeable abnormality  PSYCH/NEURO: pleasant and cooperative, no obvious depression or anxiety, speech and thought processing grossly intact  ASSESSMENT AND PLAN:  Discussed the following assessment and plan:  Diarrhea, unspecified type Generalized abdominal pain Dyspepsia   Discussed potential etiologies, options for evaluation, treatment options and precautions. Given several weeks of issues and worsening instead of improving on abx advised will need repeat in person exam, labs, stool studies and possible a CT  scan as well to help sort out if worsening bowel issue vs reaction to medications vs other. She refused going to [facility] or ER and instead we will see if her PCP can see her in the office tomorrow for a recheck. She opted to hold the antibiotics until then to see if holding them helps and because she feels they are making her sick. Discussed trying different abx, but she prefers to hold for now. She reports she does not think this is diverticulitis as feels different to her and she has no TTP on self exam. This is unlikely to be COVID19, but did advise will need to follow all precautions if comes for visit and wear mask the entire time. Notified nurse manager to alert PCP. Advised to seek care immediatly at [facility] or ER if worsening in the interim.    I discussed the assessment and  treatment plan with the patient. The patient was provided an opportunity to ask questions and all were answered. The patient agreed with the plan and demonstrated an understanding of the instructions.   The patient was advised to call back or seek an in-person evaluation if the symptoms worsen or if the condition fails to improve as anticipated.   Lucretia Kern, DO

## 2018-10-22 ENCOUNTER — Other Ambulatory Visit: Payer: Self-pay | Admitting: Cardiovascular Disease

## 2018-10-22 ENCOUNTER — Other Ambulatory Visit: Payer: Self-pay

## 2018-10-22 ENCOUNTER — Encounter: Payer: Self-pay | Admitting: Family Medicine

## 2018-10-22 ENCOUNTER — Ambulatory Visit (INDEPENDENT_AMBULATORY_CARE_PROVIDER_SITE_OTHER): Payer: PPO | Admitting: Family Medicine

## 2018-10-22 VITALS — BP 120/70 | HR 80 | Temp 99.0°F | Wt 174.5 lb

## 2018-10-22 DIAGNOSIS — R1084 Generalized abdominal pain: Secondary | ICD-10-CM

## 2018-10-22 LAB — CBC WITH DIFFERENTIAL/PLATELET
Basophils Absolute: 0.1 10*3/uL (ref 0.0–0.1)
Basophils Relative: 0.8 % (ref 0.0–3.0)
Eosinophils Absolute: 0.2 10*3/uL (ref 0.0–0.7)
Eosinophils Relative: 2.7 % (ref 0.0–5.0)
HCT: 40.5 % (ref 36.0–46.0)
Hemoglobin: 13.5 g/dL (ref 12.0–15.0)
Lymphocytes Relative: 21.2 % (ref 12.0–46.0)
Lymphs Abs: 1.4 10*3/uL (ref 0.7–4.0)
MCHC: 33.3 g/dL (ref 30.0–36.0)
MCV: 96.2 fl (ref 78.0–100.0)
Monocytes Absolute: 0.6 10*3/uL (ref 0.1–1.0)
Monocytes Relative: 9 % (ref 3.0–12.0)
Neutro Abs: 4.3 10*3/uL (ref 1.4–7.7)
Neutrophils Relative %: 66.3 % (ref 43.0–77.0)
Platelets: 240 10*3/uL (ref 150.0–400.0)
RBC: 4.21 Mil/uL (ref 3.87–5.11)
RDW: 13.3 % (ref 11.5–15.5)
WBC: 6.4 10*3/uL (ref 4.0–10.5)

## 2018-10-22 LAB — POC URINALSYSI DIPSTICK (AUTOMATED)
Bilirubin, UA: NEGATIVE
Blood, UA: NEGATIVE
Glucose, UA: NEGATIVE
Leukocytes, UA: NEGATIVE
Nitrite, UA: NEGATIVE
Protein, UA: NEGATIVE
Spec Grav, UA: 1.02 (ref 1.010–1.025)
Urobilinogen, UA: 0.2 E.U./dL
pH, UA: 6 (ref 5.0–8.0)

## 2018-10-22 LAB — HEPATIC FUNCTION PANEL
ALT: 31 U/L (ref 0–35)
AST: 26 U/L (ref 0–37)
Albumin: 4.5 g/dL (ref 3.5–5.2)
Alkaline Phosphatase: 52 U/L (ref 39–117)
Bilirubin, Direct: 0.1 mg/dL (ref 0.0–0.3)
Total Bilirubin: 0.4 mg/dL (ref 0.2–1.2)
Total Protein: 6.6 g/dL (ref 6.0–8.3)

## 2018-10-22 LAB — BASIC METABOLIC PANEL
BUN: 23 mg/dL (ref 6–23)
CO2: 28 mEq/L (ref 19–32)
Calcium: 9.2 mg/dL (ref 8.4–10.5)
Chloride: 103 mEq/L (ref 96–112)
Creatinine, Ser: 0.81 mg/dL (ref 0.40–1.20)
GFR: 69.28 mL/min (ref >60.00)
Glucose, Bld: 95 mg/dL (ref 70–99)
Potassium: 4.1 mEq/L (ref 3.5–5.1)
Sodium: 140 mEq/L (ref 135–145)

## 2018-10-22 LAB — LIPASE: Lipase: 42 U/L (ref 11.0–59.0)

## 2018-10-22 LAB — AMYLASE: Amylase: 38 U/L (ref 27–131)

## 2018-10-22 NOTE — Progress Notes (Signed)
   Subjective:    Patient ID: Tina May, female    DOB: [birth year], 73 y.o.   MRN: [id number]  HPI Here for 3 weeks of generalized abdominal cramps and watery diarrhea. She felt some chills last night but has not had a fever. No nausea or vomiting. Imodium does not help. We started her on Cipro and Flagyl on 10-19-18 thinking this was diverticulitis, since she has had this before. However the antibiotics have not helped this time. She is drinking plenty of fluids.    Review of Systems  Constitutional: Positive for chills. Negative for diaphoresis and fever.  Respiratory: Negative.   Cardiovascular: Negative.   Gastrointestinal: Positive for abdominal pain and diarrhea. Negative for abdominal distention, anal bleeding, blood in stool, constipation, nausea, rectal pain and vomiting.  Genitourinary: Negative.        Objective:   Physical Exam Constitutional:      General: She is not in acute distress.    Appearance: She is well-developed. She is not ill-appearing.  Cardiovascular:     Rate and Rhythm: Normal rate and regular rhythm.     Pulses: Normal pulses.     Heart sounds: Normal heart sounds.  Pulmonary:     Effort: Pulmonary effort is normal.     Breath sounds: Normal breath sounds.  Abdominal:     General: Abdomen is flat. Bowel sounds are normal. There is no distension.     Palpations: Abdomen is soft. There is no mass.     Tenderness: There is no guarding or rebound.     Hernia: No hernia is present.     Comments: Mildly tender in both upper quadrants   Neurological:     Mental Status: She is alert.           Assessment & Plan:  Abdominal pains and diarrhea of uncertain etiology. Ay this point I do not think she has diverticulitis. We will stop the antibiotics. Get labs including a C diff assay. Set up a contrasted CT of abdomen and pelvis. She knows to go to the ER if she gets any worse over the weekend.  [name], MD

## 2018-10-25 ENCOUNTER — Telehealth: Payer: Self-pay | Admitting: Family Medicine

## 2018-10-25 NOTE — Telephone Encounter (Signed)
Pt called in this morning to let Dr. Sarajane Jews know that she is much better.  She stated that it was in fact a medication that she was taking that was causing the diarrhea and abd pain.  She stated that she stopped this medication and the symptoms have gone away.  She wanted Dr. Sarajane Jews to be aware of this.

## 2018-10-27 ENCOUNTER — Other Ambulatory Visit: Payer: Self-pay

## 2018-10-27 ENCOUNTER — Emergency Department (HOSPITAL_COMMUNITY): Payer: PPO

## 2018-10-27 ENCOUNTER — Emergency Department (HOSPITAL_COMMUNITY)
Admission: EM | Admit: 2018-10-27 | Discharge: 2018-10-27 | Disposition: A | Discharge status: Home / Self Care | Payer: PPO | Attending: Emergency Medicine | Admitting: Emergency Medicine

## 2018-10-27 ENCOUNTER — Encounter (HOSPITAL_COMMUNITY): Payer: Self-pay

## 2018-10-27 DIAGNOSIS — Z7982 Long term (current) use of aspirin: Secondary | ICD-10-CM | POA: Insufficient documentation

## 2018-10-27 DIAGNOSIS — Z1231 Encounter for screening mammogram for malignant neoplasm of breast: Secondary | ICD-10-CM | POA: Diagnosis not present

## 2018-10-27 DIAGNOSIS — I5022 Chronic systolic (congestive) heart failure: Secondary | ICD-10-CM | POA: Insufficient documentation

## 2018-10-27 DIAGNOSIS — R1030 Lower abdominal pain, unspecified: Secondary | ICD-10-CM

## 2018-10-27 DIAGNOSIS — Z87891 Personal history of nicotine dependence: Secondary | ICD-10-CM | POA: Insufficient documentation

## 2018-10-27 DIAGNOSIS — Z79899 Other long term (current) drug therapy: Secondary | ICD-10-CM | POA: Insufficient documentation

## 2018-10-27 DIAGNOSIS — Z79890 Hormone replacement therapy (postmenopausal): Secondary | ICD-10-CM | POA: Diagnosis not present

## 2018-10-27 DIAGNOSIS — N9489 Other specified conditions associated with female genital organs and menstrual cycle: Secondary | ICD-10-CM

## 2018-10-27 DIAGNOSIS — R197 Diarrhea, unspecified: Secondary | ICD-10-CM

## 2018-10-27 DIAGNOSIS — Z13820 Encounter for screening for osteoporosis: Secondary | ICD-10-CM | POA: Diagnosis not present

## 2018-10-27 DIAGNOSIS — Z6833 Body mass index (BMI) 33.0-33.9, adult: Secondary | ICD-10-CM | POA: Diagnosis not present

## 2018-10-27 DIAGNOSIS — I110 Hypertensive heart disease with heart failure: Secondary | ICD-10-CM | POA: Insufficient documentation

## 2018-10-27 DIAGNOSIS — Z01419 Encounter for gynecological examination (general) (routine) without abnormal findings: Secondary | ICD-10-CM | POA: Diagnosis not present

## 2018-10-27 DIAGNOSIS — I509 Heart failure, unspecified: Secondary | ICD-10-CM | POA: Diagnosis not present

## 2018-10-27 DIAGNOSIS — R1909 Other intra-abdominal and pelvic swelling, mass and lump: Secondary | ICD-10-CM | POA: Insufficient documentation

## 2018-10-27 DIAGNOSIS — J45909 Unspecified asthma, uncomplicated: Secondary | ICD-10-CM | POA: Diagnosis not present

## 2018-10-27 DIAGNOSIS — Z1211 Encounter for screening for malignant neoplasm of colon: Secondary | ICD-10-CM | POA: Diagnosis not present

## 2018-10-27 LAB — CBC
HCT: 40.7 % (ref 36.0–46.0)
Hemoglobin: 13 g/dL (ref 12.0–15.0)
MCH: 31.5 pg (ref 26.0–34.0)
MCHC: 31.9 g/dL (ref 30.0–36.0)
MCV: 98.5 fL (ref 80.0–100.0)
Platelets: 222 10*3/uL (ref 150–400)
RBC: 4.13 MIL/uL (ref 3.87–5.11)
RDW: 12.9 % (ref 11.5–15.5)
WBC: 6.3 10*3/uL (ref 4.0–10.5)
nRBC: 0 % (ref 0.0–0.2)

## 2018-10-27 LAB — URINALYSIS, ROUTINE W REFLEX MICROSCOPIC
Bilirubin Urine: NEGATIVE
Glucose, UA: NEGATIVE mg/dL
Hgb urine dipstick: NEGATIVE
Ketones, ur: 20 mg/dL — AB
Leukocytes,Ua: NEGATIVE
Nitrite: NEGATIVE
Protein, ur: NEGATIVE mg/dL
Specific Gravity, Urine: >1.046 — ABNORMAL HIGH (ref 1.005–1.030)
pH: 5 (ref 5.0–8.0)

## 2018-10-27 LAB — COMPREHENSIVE METABOLIC PANEL
ALT: 27 U/L (ref 0–44)
AST: 26 U/L (ref 15–41)
Albumin: 4.1 g/dL (ref 3.5–5.0)
Alkaline Phosphatase: 48 U/L (ref 38–126)
Anion gap: 10 (ref 5–15)
BUN: 21 mg/dL (ref 8–23)
CO2: 24 mmol/L (ref 22–32)
Calcium: 9 mg/dL (ref 8.9–10.3)
Chloride: 107 mmol/L (ref 98–111)
Creatinine, Ser: 0.69 mg/dL (ref 0.44–1.00)
GFR calc Af Amer: >60 mL/min (ref >60)
GFR calc non Af Amer: >60 mL/min (ref >60)
Glucose, Bld: 90 mg/dL (ref 70–99)
Potassium: 3.5 mmol/L (ref 3.5–5.1)
Sodium: 141 mmol/L (ref 135–145)
Total Bilirubin: 0.4 mg/dL (ref 0.3–1.2)
Total Protein: 6.6 g/dL (ref 6.5–8.1)

## 2018-10-27 LAB — LIPASE, BLOOD: Lipase: 30 U/L (ref 11–51)

## 2018-10-27 MED ORDER — SODIUM CHLORIDE (PF) 0.9 % IJ SOLN
INTRAMUSCULAR | Status: AC
  Filled 2018-10-27: qty 50

## 2018-10-27 MED ORDER — IOHEXOL 300 MG/ML  SOLN
100.0000 mL | Freq: Once | INTRAMUSCULAR | Status: AC | PRN
  Administered 2018-10-27: 100 mL via INTRAVENOUS

## 2018-10-27 MED ORDER — SODIUM CHLORIDE 0.9% FLUSH: 3.0000 mL | Freq: Once | INTRAVENOUS | Status: DC

## 2018-10-27 NOTE — Telephone Encounter (Signed)
Pt states she is also considering going to the ED to get a CT scan. Please advise.

## 2018-10-27 NOTE — Telephone Encounter (Signed)
Dr. Fry please advise. Thanks  

## 2018-10-27 NOTE — Discharge Instructions (Addendum)
You were seen in the emergency department for some low abdominal pain and diarrhea.  You had blood work urinalysis and a CAT scan that did not show an obvious explanation for your symptoms.  The CAT scan did show an enlargement of the area around her ovary and this will need to be followed up with your gynecologist.  Included below is the report from the CAT scan.  These return if any worsening symptoms.  IMPRESSION:  1. No acute findings or explanation for the patient's symptoms.  There is no bowel wall thickening or bowel obstruction. The small  bowel anastomosis appears stable.  2. Slow growth of right adnexal lesion from 2014, suggesting a  low-grade cystic ovarian neoplasm. Stable mild enlargement of the  left ovary. Gynecology referral recommended. Ultrasound may be  helpful for further characterization.  3. Stable cystic lesion in the pancreatic tail consistent with a  benign finding.  4. Hepatic and renal cysts.  5.  Aortic Atherosclerosis (ICD10-I70.0).

## 2018-10-27 NOTE — Telephone Encounter (Signed)
I see she is in the ED. This is the best option

## 2018-10-27 NOTE — ED Triage Notes (Signed)
Patient reports that she has been having abdominal pain and diarrhea x 3 weeks. Patient states symptoms have been getting progressively worse. Patient denies any N/v.

## 2018-10-27 NOTE — ED Provider Notes (Signed)
[facility] DEPT Provider Note   CSN: [id number] Arrival date & time: 10/27/18  1230     History   Chief Complaint Chief Complaint  Patient presents with   Abdominal Pain   Diarrhea    HPI Tina May is a 73 y.o. female.  She is presenting to the emergency department with 3 weeks of diffuse lower abdominal pain cramps and diarrhea.  She had started 2 new medications prior to that and she stopped them with some improvement in her symptoms for a few days but then her symptoms recurred.  She has seen her primary care doctor and had some lab work done by them but he recommended that she come and get a CAT scan to make sure she does not have diverticulitis.  She said she had initially started on some medication for diverticulitis but that seemed to make her symptoms worse so she only took 1 days worth.  No fevers or chills no chest pain no shortness of breath.  She has not seen any gross blood per rectum.  She had her gynecologic exam today and was unremarkable.     The history is provided by the patient.  Abdominal Pain Pain location:  LLQ and RLQ Pain quality: cramping   Pain radiates to:  Does not radiate Pain severity:  Moderate Onset quality:  Gradual Timing:  Intermittent Progression:  Unchanged Chronicity:  New Context: not recent travel, not suspicious food intake and not trauma   Relieved by:  Nothing Worsened by:  Nothing Ineffective treatments:  None tried Associated symptoms: diarrhea and nausea   Associated symptoms: no chest pain, no chills, no constipation, no cough, no dysuria, no fever, no hematemesis, no hematochezia, no hematuria, no melena, no shortness of breath, no sore throat, no vaginal discharge and no vomiting   Diarrhea Associated symptoms: abdominal pain   Associated symptoms: no chills, no fever, no headaches and no vomiting     Past Medical History:  Diagnosis Date   Allergy    Arthritis    Asthma     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   Cataract    starting   Diverticulitis    Diverticulosis    External hemorrhoids    Hepatic cyst    Hyperlipidemia    Hypertension    Pancreatic lesion 03/11/13    Patient Active Problem List   Diagnosis Date Noted   Palpitations 41/32/4401   Chronic systolic CHF (congestive heart failure) ([place]) 01/27/2017   Pleural effusion on right 12/03/2016   CHF (congestive heart failure), NYHA class I, acute, systolic ([place]) 02/72/5366   Acute CHF ([place]) 10/26/2016   Plantar fasciitis, bilateral 03/17/2013   ANXIETY STATE, UNSPECIFIED 03/27/2009   Hyperlipidemia 05/04/2007   Benign essential HTN 05/04/2007   ALLERGIC RHINITIS 05/04/2007   Asthma 05/04/2007   Osteoarthritis 05/04/2007    Past Surgical History:  Procedure Laterality Date   ABDOMINAL HYSTERECTOMY     COLONOSCOPY  01/25/2014   per Dr. [name], diverticulae only, repeat in 10 yrs    LEFT HEART CATH AND CORONARY ANGIOGRAPHY N/A 01/27/2017   Procedure: LEFT HEART CATH AND CORONARY ANGIOGRAPHY;  Surgeon: Martinique, Peter M, MD;  Location: [facility];  Service: Cardiovascular;  Laterality: N/A;   SMALL INTESTINE SURGERY     from small intestine     OB History    Gravida  3   Para  3   Term      Preterm  AB      Living  3     SAB      TAB      Ectopic      Multiple      Live Births               Home Medications    Prior to Admission medications   Medication Sig Start Date End Date Taking? Authorizing Provider  amLODipine (NORVASC) 5 MG tablet Take 1 tablet (5 mg total) by mouth daily. 09/20/18   Daune Perch, NP  aspirin EC 81 MG tablet Take 81 mg by mouth daily.    [provider]  carvedilol (COREG) 25 MG tablet Take 1 tablet (25 mg total) by mouth 2 (two) times daily. 01/01/18 12/27/18  Nahser, Wonda Cheng, MD  cyclobenzaprine (FLEXERIL) 10 MG tablet TAKE 1 TABLET BY MOUTH THREE TIMES A DAY AS NEEDED FOR MUSCLE SPASMS  04/22/18   [name], MD  ENTRESTO 97-103 MG TAKE 1 TABLET BY MOUTH TWICE A DAY 09/06/18   Nahser, Wonda Cheng, MD  estradiol (ESTRACE) 0.5 MG tablet Take 0.5 mg by mouth once a week. For Menopausal hot flashes    [provider]  FIBER SELECT GUMMIES CHEW Chew 2 tablets by mouth daily.    [provider]  furosemide (LASIX) 40 MG tablet TAKE 1 TABLET BY MOUTH EVERY DAY 10/20/18   Daune Perch, NP  Hyaluronic Acid-Vitamin C (HYALURONIC ACID PO) Take 1 tablet by mouth daily. Women's Hyaluronic Acid    [provider]  meloxicam (MOBIC) 15 MG tablet Take 1 tablet (15 mg total) by mouth daily. 09/29/18   [name], MD  Misc Natural Products (TART CHERRY ADVANCED PO) Take 60 mLs by mouth at bedtime.    [provider]  Multiple Vitamin (MULTIVITAMIN WITH MINERALS) TABS tablet Take 1 tablet by mouth daily. One-A-Day    [provider]  potassium chloride (MICRO-K) 10 MEQ CR capsule Take 1 capsule (10 mEq total) by mouth daily. 07/18/18   Nahser, Wonda Cheng, MD  PROAIR HFA 108 [phone] Base) MCG/ACT inhaler INHALE 2 PUFFS INTO THE LUNGS EVERY 4 (FOUR) HOURS AS NEEDED FOR WHEEZING OR SHORTNESS OF BREATH. 04/27/18   [name], MD  RESTASIS 0.05 % ophthalmic emulsion Place 1 drop into both eyes 2 (two) times daily.  09/28/13   [provider]  rosuvastatin (CRESTOR) 5 MG tablet TAKE 1 TABLET (5 MG TOTAL) BY MOUTH DAILY. 09/29/18   [name], MD  spironolactone (ALDACTONE) 25 MG tablet Take 0.5 tablets (12.5 mg total) by mouth daily. 09/22/18   Daune Perch, NP  TURMERIC CURCUMIN PO Take 1,000 mg by mouth daily.    [provider]    Family History Family History  Problem Relation Age of Onset   Hyperlipidemia Other    Hypertension Other    Prostate cancer Other    Healthy Sister    Healthy Brother    Colon cancer Neg Hx     Social History Social History   Tobacco Use   Smoking status: Former Smoker    Packs/day:  1.00    Years: 20.00    Pack years: 20.00    Types: Cigarettes    Quit date: 05/06/1991    Years since quitting: 27.4   Smokeless tobacco: Never Used  Substance Use Topics   Alcohol use: Yes    Alcohol/week: 7.0 standard drinks    Types: 7 Glasses of wine per week  Comment: occ   Drug use: No     Allergies   Atorvastatin, Diclofenac, and Morphine and related   Review of Systems Review of Systems  Constitutional: Negative for chills and fever.  HENT: Negative for sore throat.   Eyes: Negative for visual disturbance.  Respiratory: Negative for cough and shortness of breath.   Cardiovascular: Negative for chest pain.  Gastrointestinal: Positive for abdominal pain, diarrhea and nausea. Negative for constipation, hematemesis, hematochezia, melena and vomiting.  Genitourinary: Negative for dysuria, hematuria and vaginal discharge.  Musculoskeletal: Negative for neck pain.  Skin: Negative for rash.  Neurological: Negative for headaches.     Physical Exam Updated Vital Signs BP (!) 148/101 (BP Location: Right Arm)    Pulse 70    Temp 98.1 F (36.7 C) (Oral)    Resp 18    Ht 5\' 2"  (1.575 m)    Wt 77.1 kg    SpO2 100%    BMI 31.09 kg/m   Physical Exam Vitals signs and nursing note reviewed.  Constitutional:      General: She is not in acute distress.    Appearance: She is well-developed.  HENT:     Head: Normocephalic and atraumatic.  Eyes:     Conjunctiva/sclera: Conjunctivae normal.  Neck:     Musculoskeletal: Neck supple.  Cardiovascular:     Rate and Rhythm: Normal rate and regular rhythm.     Heart sounds: No murmur.  Pulmonary:     Effort: Pulmonary effort is normal. No respiratory distress.     Breath sounds: Normal breath sounds.  Abdominal:     Palpations: Abdomen is soft.     Tenderness: There is no abdominal tenderness. There is no guarding or rebound.  Musculoskeletal: Normal range of motion.     Right lower leg: No edema.     Left lower leg: No  edema.  Skin:    General: Skin is warm and dry.     Capillary Refill: Capillary refill takes less than 2 seconds.  Neurological:     General: No focal deficit present.     Mental Status: She is alert and oriented to person, place, and time.     Sensory: No sensory deficit.     Motor: No weakness.      ED Treatments / Results  Labs (all labs ordered are listed, but only abnormal results are displayed) Labs Reviewed  URINALYSIS, ROUTINE W REFLEX MICROSCOPIC - Abnormal; Notable for the following components:      Result Value   Specific Gravity, Urine >1.046 (*)    Ketones, ur 20 (*)    All other components within normal limits  LIPASE, BLOOD  COMPREHENSIVE METABOLIC PANEL  CBC    EKG None  Radiology Ct Abdomen Pelvis W Contrast  Result Date: 10/27/2018 CLINICAL DATA:  Low abdominal pain with diarrhea for 3 weeks. 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 EXAM: CT ABDOMEN AND PELVIS WITH CONTRAST TECHNIQUE: Multidetector CT imaging of the abdomen and pelvis was performed using the standard protocol following bolus administration of intravenous contrast. CONTRAST:  154mL OMNIPAQUE IOHEXOL 300 MG/ML  SOLN COMPARISON:  CT 12/19/2016 and 03/12/2013. FINDINGS: Lower chest: The visualized lower chest appears stable without significant findings. Hepatobiliary: There are stable low-density hepatic lesions which are likely all cysts. No new or enlarging lesions. The gallbladder appears normal. There is mild extrahepatic biliary dilatation with the common bile duct measuring up to 9 mm in diameter, similar to the previous study. The duct  tapers distally. No evidence of choledocholithiasis. Pancreas: Stable 13 mm low-density lesion in the pancreatic tail on image 13. No pancreatic ductal dilatation or surrounding inflammation. Spleen: Normal in size without focal abnormality. Adrenals/Urinary Tract: Both adrenal glands appear stable without significant findings. A cyst posteriorly  in the mid right kidney has mildly enlarged, measuring 2.8 cm on image 37/2, although appears simple. No evidence of renal mass, urinary tract calculus or hydronephrosis. The bladder appears normal. Stomach/Bowel: No evidence of bowel wall thickening, distention or surrounding inflammatory change. The small bowel anastomosis in the mid abdomen appears stable. The appendix appears normal. There are mild diverticular changes in descending and sigmoid colon. Vascular/Lymphatic: There are no enlarged abdominal or pelvic lymph nodes. Mild aortic and branch vessel atherosclerosis. No acute vascular findings. Reproductive: The pelvis was not included on the 2018 study, although was imaged in 2014. The uterus is surgically absent. There is a stable calcification along the right aspect of the vaginal cuff. 5.8 x 4.7 cm mildly complex right adnexal mass has enlarged from 2014 at which time it measured 4.8 x 4.0 cm. Enlargement is most obvious on the coronal images where it measures up to 7.7 cm on image 93/5. The left ovary is also prominent for age, measuring 3.0 x 2.6 cm, similar to the previous study. Other: No ascites or peritoneal nodularity. Intact anterior abdominal wall. Musculoskeletal: No acute or significant osseous findings. There is extensive lumbar spondylosis with vacuum disc degeneration and probable resulting spinal stenosis, especially at L4-5. IMPRESSION: 1. No acute findings or explanation for the patient's symptoms. There is no bowel wall thickening or bowel obstruction. The small bowel anastomosis appears stable. 2. Slow growth of right adnexal lesion from 2014, suggesting a low-grade cystic ovarian neoplasm. Stable mild enlargement of the left ovary. Gynecology referral recommended. Ultrasound may be helpful for further characterization. 3. Stable cystic lesion in the pancreatic tail consistent with a benign finding. 4. Hepatic and renal cysts. 5.  Aortic Atherosclerosis (ICD10-I70.0). Electronically  Signed   By: Richardean Sale M.D.   On: 10/27/2018 18:41    Procedures Procedures (including critical care time)  Medications Ordered in ED Medications  iohexol (OMNIPAQUE) 300 MG/ML solution 100 mL (100 mLs Intravenous Contrast Given 10/27/18 1807)     Initial Impression / Assessment and Plan / ED Course  I have reviewed the triage vital signs and the nursing notes.  Pertinent labs & imaging results that were available during my care of the patient were reviewed by me and considered in my medical decision making (see chart for details).  Clinical Course as of Oct 27 1309  Wed Oct 27, 3867  652 73 year old female here with crampy lower abdominal pain and loose stool on and off for 3 weeks.  She is afebrile here and has a soft exam.  She had some labs done by her PCP a few days ago are fairly unremarkable.  She has a stool culture that is pending.  She was sent in by her PCP to get a CAT scan so will proceed with that and she is got some repeat labs cooking.  I am also going to add a C. difficile on her as would be in the differential.  Differential includes obstruction, colitis, diverticulitis, C. difficile, gastroenteritis.   [MB]  2979 The patient CT was reviewed by me.  There is no evidence of obstruction or abscess.  Radiologist does comment upon adnexal lesion that appears to be growing in size.  I will review this  with the patient and make sure that she follows up with her gynecologist.   [MB]    Clinical Course User Index [MB] Hayden Rasmussen, MD        Final Clinical Impressions(s) / ED Diagnoses   Final diagnoses:  Lower abdominal pain  Diarrhea, unspecified type  Adnexal mass    ED Discharge Orders    None       Hayden Rasmussen, MD 10/28/18 1311

## 2018-10-27 NOTE — Telephone Encounter (Signed)
Pt called back requesting to speak with a nurse. Pt states her diarrhea has come back. Please advise.

## 2018-10-29 LAB — STOOL CULTURE
MICRO NUMBER:: 593837
MICRO NUMBER:: 593838
MICRO NUMBER:: 593839
SHIGA RESULT:: NOT DETECTED
SPECIMEN QUALITY:: ADEQUATE
SPECIMEN QUALITY:: ADEQUATE
SPECIMEN QUALITY:: ADEQUATE

## 2018-11-04 DIAGNOSIS — N83291 Other ovarian cyst, right side: Secondary | ICD-10-CM | POA: Diagnosis not present

## 2018-11-09 ENCOUNTER — Telehealth: Payer: Self-pay

## 2018-11-09 NOTE — Telephone Encounter (Signed)
Pt has answered no to all prescreening questions and is aware of new office protocol.       COVID-19 Pre-Screening Questions:  . In the past 7 to 10 days have you had a cough,  shortness of breath, headache, congestion, fever (100 or greater) body aches, chills, sore throat, or sudden loss of taste or sense of smell? NO . Have you been around anyone with known Covid 19? NO . Have you been around anyone who is awaiting Covid 19 test results in the past 7 to 10 days? NO . Have you been around anyone who has been exposed to Covid 19, or has mentioned symptoms of Covid 19 within the past 7 to 10 days? NO  If you have any concerns/questions about symptoms patients report during screening (either on the phone or at threshold). Contact the provider seeing the patient or DOD for further guidance.  If neither are available contact a member of the leadership team.

## 2018-11-12 ENCOUNTER — Ambulatory Visit: Payer: PPO | Admitting: Cardiovascular Disease

## 2018-11-12 ENCOUNTER — Other Ambulatory Visit: Payer: Self-pay

## 2018-11-12 ENCOUNTER — Encounter: Payer: Self-pay | Admitting: Cardiovascular Disease

## 2018-11-12 VITALS — BP 122/84 | HR 71 | Ht 62.0 in | Wt 170.8 lb

## 2018-11-12 DIAGNOSIS — I5022 Chronic systolic (congestive) heart failure: Secondary | ICD-10-CM | POA: Diagnosis not present

## 2018-11-12 MED ORDER — METOPROLOL SUCCINATE ER 50 MG PO TB24: 50.0000 mg | ORAL_TABLET | Freq: Every day | ORAL | 3 refills | Status: DC

## 2018-11-12 NOTE — Patient Instructions (Addendum)
Medication Instructions:  Your physician has recommended you make the following change in your medication:   STOP Carvedilol (Coreg) START Metoprolol Succinate (Toprol XL) 50 mg once daily one day after stopping Carvedilol  If you need a refill on your cardiac medications before your next appointment, please call your pharmacy.    Lab work: None Ordered   Testing/Procedures: None Ordered   Follow-Up: At [facility], you and your health needs are our priority.  As part of our continuing mission to provide you with exceptional heart care, we have created designated Provider Care Teams.  These Care Teams include your primary Cardiologist (physician) and Advanced Practice Providers (APPs -  Physician Assistants and Nurse Practitioners) who all work together to provide you with the care you need, when you need it. You will need a follow up appointment in:  3 months with Mertie Moores, MD on Monday Oct. 12 at 9:00 am. In the future you may see one of the following Advanced Practice Providers on your designated Care Team: Richardson Dopp, PA-C [place], [place] . Daune Perch, NP

## 2018-11-12 NOTE — Progress Notes (Signed)
Cardiology Office Note:    Date:  11/12/2018   ID:  Tina, May Jun 24, 1945, MRN [id number]  PCP:  [name], MD  Cardiologist:  Mertie Moores, MD    Referring MD: [name], MD   Problem List 1. Chronic systolic CHF  2. Essential HTN 3. Hyperlipidemia 4. COPD  5. Pancreatic lesion:  No chief complaint on file.   Tina May is a 73 y.o. female with a hx of chronic systolic CHF in June, 8295.  I saw her in consultation while in the hospital .  EF by echo at that time was 25-30%.Her last week and is unsure and it looks her heart function is improving as we started at the 25-30 range 45 kg  She has been on Entresto 24-26 mg BID Coreg 6.25 BID Lasix 40 mg a day   Feeling much better Cath 01/27/17 showed mild RCA 10% stenosis - otherwise normal coronaries.  Feeling great. Tolerating the Entresto  No shortness of breath   Aug. 30, 2019:  Tina May is seen today for follow-up of her congestive heart failure.  She is done very well.  She is on Entresto 97-103 mg a day.  She is tolerating it quite well. Exercising regularly  - exercise capacity is much better.   Had a great trip to [place] recently  - fished, dog sledded, rode in a helicopter.   November 12, 2018   Continues to travel.   Back to [place] this past year.   car[place] cruise, traveled to [place].   Several months ago ,  Had tachypalps .  Saw [name] , NP  . Was started on amlodipine ( caused upset stomach, diarrhea )  Has continued to have GI problems.   She thinks it her carvedilol ( process of elimination )  She did a [facility] screening test.  She was found to have very mild right carotid artery disease.  Her ankle-brachial indexes were normal.  Ultrasound of the aorta was normal.  BMI was 32.  She needs to have GYN surgery ,   Needs oopherectomy. [name] No CP , no dypnsea She is at low risk for her upcoming GYN surgery .    Past Medical History:  Diagnosis Date  . Allergy   .  Arthritis   . Asthma   . 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 Cataract    starting  . Diverticulitis   . Diverticulosis   . External hemorrhoids   . Hepatic cyst   . Hyperlipidemia   . Hypertension   . Pancreatic lesion 03/11/13    Past Surgical History:  Procedure Laterality Date  . ABDOMINAL HYSTERECTOMY    . COLONOSCOPY  01/25/2014   per Dr. [name], diverticulae only, repeat in 10 yrs   . LEFT HEART CATH AND CORONARY ANGIOGRAPHY N/A 01/27/2017   Procedure: LEFT HEART CATH AND CORONARY ANGIOGRAPHY;  Surgeon: Martinique, Peter M, MD;  Location: [facility];  Service: Cardiovascular;  Laterality: N/A;  . SMALL INTESTINE SURGERY     from small intestine    Current Medications: Current Meds  Medication Sig  . aspirin EC 81 MG tablet Take 81 mg by mouth daily.  . cyclobenzaprine (FLEXERIL) 10 MG tablet TAKE 1 TABLET BY MOUTH THREE TIMES A DAY AS NEEDED FOR MUSCLE SPASMS  . ENTRESTO 97-103 MG TAKE 1 TABLET BY MOUTH TWICE A DAY  . estradiol (ESTRACE) 0.5 MG tablet Take 0.5 mg by mouth once  a week. For Menopausal hot flashes  . FIBER SELECT GUMMIES CHEW Chew 2 tablets by mouth daily.  . furosemide (LASIX) 40 MG tablet Take 40 mg by mouth daily as needed for fluid or edema.  . Hyaluronic Acid-Vitamin C (HYALURONIC ACID PO) Take 1 tablet by mouth daily. Women's Hyaluronic Acid  . meloxicam (MOBIC) 15 MG tablet Take 1 tablet (15 mg total) by mouth daily.  . Misc Natural Products (TART CHERRY ADVANCED PO) Take 60 mLs by mouth at bedtime.  . Multiple Vitamin (MULTIVITAMIN WITH MINERALS) TABS tablet Take 1 tablet by mouth daily. One-A-Day  . potassium chloride (K-DUR) 10 MEQ tablet Take 10 mEq by mouth daily as needed. Takes with Lasix  . PROAIR HFA 108 (90 Base) MCG/ACT inhaler INHALE 2 PUFFS INTO THE LUNGS EVERY 4 (FOUR) HOURS AS NEEDED FOR WHEEZING OR SHORTNESS OF BREATH.  . RESTASIS 0.05 % ophthalmic emulsion Place 1 drop into both eyes 2 (two) times daily.   . rosuvastatin (CRESTOR)  5 MG tablet TAKE 1 TABLET (5 MG TOTAL) BY MOUTH DAILY.  [name] TURMERIC CURCUMIN PO Take 1,000 mg by mouth daily.  . [DISCONTINUED] carvedilol (COREG) 25 MG tablet Take 1 tablet (25 mg total) by mouth 2 (two) times daily.     Allergies:   Amlodipine, Atorvastatin, Diclofenac, and Morphine and related   Social History   Socioeconomic History  . Marital status: Married    Spouse name: Not on file  . Number of children: 3  . Years of education: Not on file  . Highest education level: Not on file  Occupational History  . Occupation: Retired  Scientific laboratory technician  . Financial resource strain: Not on file  . Food insecurity    Worry: Not on file    Inability: Not on file  . Transportation needs    Medical: Not on file    Non-medical: Not on file  Tobacco Use  . Smoking status: Former Smoker    Packs/day: 1.00    Years: 20.00    Pack years: 20.00    Types: Cigarettes    Quit date: 05/06/1991    Years since quitting: 27.5  . Smokeless tobacco: Never Used  Substance and Sexual Activity  . Alcohol use: Yes    Alcohol/week: 7.0 standard drinks    Types: 7 Glasses of wine per week    Comment: occ  . Drug use: No  . Sexual activity: Yes    Birth control/protection: Surgical, Post-menopausal    Comment: partial hysterectomy  Lifestyle  . Physical activity    Days per week: Not on file    Minutes per session: Not on file  . Stress: Not on file  Relationships  . Social Herbalist on phone: Not on file    Gets together: Not on file    Attends religious service: Not on file    Active member of club or organization: Not on file    Attends meetings of clubs or organizations: Not on file    Relationship status: Not on file  Other Topics Concern  . Not on file  Social History Narrative  . Not on file     Family History: The patient's family history includes Healthy in her brother and sister; Hyperlipidemia in an other family member; Hypertension in an other family member; Prostate  cancer in an other family member. There is no history of Colon cancer. ROS:   Please see the history of present illness.     All  other systems reviewed and are negative.  EKGs/Labs/Other Studies Reviewed:    The following studies were reviewed today:   EKG:   Recent Labs: 09/20/2018: Magnesium 2.1; TSH 0.804 10/27/2018: ALT 27; BUN 21; Creatinine, Ser 0.69; Hemoglobin 13.0; Platelets 222; Potassium 3.5; Sodium 141  Recent Lipid Panel    Component Value Date/Time   CHOL 210 (H) 04/19/2018 0846   TRIG 152 (H) 04/19/2018 0846   HDL 63 04/19/2018 0846   CHOLHDL 3.3 04/19/2018 0846   CHOLHDL 4 09/11/2017 0814   VLDL 40.8 (H) 09/11/2017 0814   LDLCALC 117 (H) 04/19/2018 0846   LDLDIRECT 113.0 09/11/2017 0814    Physical Exam: Blood pressure 122/84, pulse 71, height 5\' 2"  (1.575 m), weight 170 lb 12.8 oz (77.5 kg), SpO2 98 %.  GEN:  Well nourished, well developed in no acute distress HEENT: Normal NECK: No JVD; No carotid bruits LYMPHATICS: No lymphadenopathy CARDIAC: RRR , no murmurs, rubs, gallops RESPIRATORY:  Clear to auscultation without rales, wheezing or rhonchi  ABDOMEN: Soft, non-tender, non-distended MUSCULOSKELETAL:  No edema; No deformity  SKIN: Warm and dry NEUROLOGIC:  Alert and oriented x 3   ASSESSMENT:    No diagnosis found. PLAN:    In order of problems listed above:  1. Chronic Systolic CHF:   Has GI upset with coreg.   Will DC coreg and start toprol XL  50 mg a day ( [facility]  Continue entresto,   Consider adding aldactone if BP needs.  Will see her in 3 months   2. Hyperlipidemia:      Labs are stable     3.  Pre- op: The patient has had some changes on a recent scan.  She needs to have an oophorectomy.  She is not had any episodes of chest pain or shortness of breath.  Her ejection fraction is stable in the 45 to 50% range.  She is at low risk for her upcoming GYN surgery.    Medication Adjustments/Labs and Tests Ordered: Current  medicines are reviewed at length with the patient today.  Concerns regarding medicines are outlined above.  No orders of the defined types were placed in this encounter.  Meds ordered this encounter  Medications  . metoprolol succinate (TOPROL-XL) 50 MG 24 hr tablet    Sig: Take 1 tablet (50 mg total) by mouth daily. Take with or immediately following a meal.    Dispense:  90 tablet    Refill:  3    Signed, Mertie Moores, MD  11/12/2018 6:49 PM    [place]

## 2018-11-13 ENCOUNTER — Ambulatory Visit
Admission: RE | Admit: 2018-11-13 | Discharge: 2018-11-13 | Disposition: A | Discharge status: Home / Self Care | Payer: PPO | Source: Ambulatory Visit | Attending: Family Medicine | Admitting: Family Medicine

## 2018-11-13 DIAGNOSIS — M48061 Spinal stenosis, lumbar region without neurogenic claudication: Secondary | ICD-10-CM | POA: Diagnosis not present

## 2018-11-13 DIAGNOSIS — G8929 Other chronic pain: Secondary | ICD-10-CM

## 2018-11-13 DIAGNOSIS — M5442 Lumbago with sciatica, left side: Secondary | ICD-10-CM

## 2018-11-17 ENCOUNTER — Telehealth: Payer: Self-pay | Admitting: Family Medicine

## 2018-11-17 NOTE — Telephone Encounter (Signed)
Pt read MR results by Dr Sarajane Jews 11/17/18. Pt would like to have physical therapy scheduled. Will route request to office.

## 2018-11-19 ENCOUNTER — Other Ambulatory Visit: Payer: Self-pay | Admitting: *Deleted

## 2018-11-19 DIAGNOSIS — G8929 Other chronic pain: Secondary | ICD-10-CM

## 2018-11-19 DIAGNOSIS — M159 Polyosteoarthritis, unspecified: Secondary | ICD-10-CM

## 2018-11-19 DIAGNOSIS — M5442 Lumbago with sciatica, left side: Secondary | ICD-10-CM

## 2018-11-19 NOTE — Telephone Encounter (Signed)
PT order put in for patient

## 2018-11-22 ENCOUNTER — Telehealth: Payer: Self-pay

## 2018-11-22 DIAGNOSIS — R1084 Generalized abdominal pain: Secondary | ICD-10-CM

## 2018-11-22 NOTE — Telephone Encounter (Signed)
Copied from [place] [id number]. Topic: Referral - Request for Referral >> Nov 22, 2018  8:27 AM Lennox Solders wrote: Has patient seen PCP for this complaint? Yes Reason for referral: abd pain . Pt would like to see GI specialist

## 2018-11-23 NOTE — Telephone Encounter (Signed)
Ok to place referral.

## 2018-11-24 NOTE — Telephone Encounter (Signed)
Left a detailed message on verified voice mail.   

## 2018-11-24 NOTE — Telephone Encounter (Signed)
I did the referral 

## 2018-11-26 ENCOUNTER — Other Ambulatory Visit: Payer: Self-pay

## 2018-11-26 ENCOUNTER — Ambulatory Visit: Payer: PPO | Attending: Family Medicine | Admitting: Physical Therapy

## 2018-11-26 ENCOUNTER — Encounter: Payer: Self-pay | Admitting: Physical Therapy

## 2018-11-26 DIAGNOSIS — M545 Low back pain, unspecified: Secondary | ICD-10-CM

## 2018-11-26 DIAGNOSIS — G8929 Other chronic pain: Secondary | ICD-10-CM

## 2018-11-26 DIAGNOSIS — M6281 Muscle weakness (generalized): Secondary | ICD-10-CM | POA: Diagnosis not present

## 2018-11-26 DIAGNOSIS — R2689 Other abnormalities of gait and mobility: Secondary | ICD-10-CM

## 2018-11-26 NOTE — Therapy (Signed)
[facility] [address] [place], [place], [address] Phone: [phone]   Fax:  [phone]  Physical Therapy Evaluation  Patient Details  Name: Tina May MRN: [id number] Date of Birth: [birth year] Referring Provider (PT): [name], MD   Encounter Date: 11/26/2018  PT End of Session - 11/26/18 1046    Visit Number  1    Date for PT Re-Evaluation  01/21/19    Authorization Type  [employer]    PT Start Time  0905    PT Stop Time  0950    PT Time Calculation (min)  45 min    Activity Tolerance  Patient tolerated treatment well    Behavior During Therapy  [facility] for tasks assessed/performed       Past Medical History:  Diagnosis Date  . Allergy   . Arthritis   . Asthma   . 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 Cataract    starting  . Diverticulitis   . Diverticulosis   . External hemorrhoids   . Hepatic cyst   . Hyperlipidemia   . Hypertension   . Pancreatic lesion 03/11/13    Past Surgical History:  Procedure Laterality Date  . ABDOMINAL HYSTERECTOMY    . COLONOSCOPY  01/25/2014   per Dr. [name], diverticulae only, repeat in 10 yrs   . LEFT HEART CATH AND CORONARY ANGIOGRAPHY N/A 01/27/2017   Procedure: LEFT HEART CATH AND CORONARY ANGIOGRAPHY;  Surgeon: Martinique, Peter M, MD;  Location: [facility];  Service: Cardiovascular;  Laterality: N/A;  . SMALL INTESTINE SURGERY     from small intestine    There were no vitals filed for this visit.   Subjective Assessment - 11/26/18 0910    Subjective  Pt states LBP started last fall while shoveling dirt.  Meds and chiropractor helped initially but inactivity due to COVID-19 has caused low back to be more achey and worse weakness in bil LEs.  Pt is very active with golf, gardening, 2.5 acre yardwork, housework etc.    How long can you walk comfortably?  leg weakness with stairs and walking - 15 min walking, 14 steps    Diagnostic tests   MRI 11/2018 multi-level spondylosis, diffuse central and lateral stenosis, advanced facet hypertrophy    Patient Stated Goals  get legs stronger, keep up with desired level of activity    Currently in Pain?  Yes    Pain Score  1     Pain Location  Back    Pain Orientation  Right;Left;Lower;Mid    Pain Descriptors / Indicators  Aching    Pain Type  Chronic pain    Pain Radiating Towards  across low back, used to go into LEs but not anymore    Pain Onset  More than a month ago    Pain Frequency  Intermittent    Aggravating Factors   as day goes on (active day) back gets more tired and achey    Pain Relieving Factors  take a break, sit down    Effect of Pain on Daily Activities  stairs, prolonged walking >15 min, end of busy day increased back ache         [facility] PT Assessment - 11/26/18 0001      Assessment   Medical Diagnosis  M54.42,M54.41,G89.29 (ICD-10-CM) - Chronic bilateral low back pain with bilateral sciatica    Referring Provider (PT)  [name], MD    Onset  Date/Surgical Date  --   fall 2019   Hand Dominance  Right    Next MD Visit  no    Prior Therapy  yes, chiropractic      Precautions   Precautions  None      Restrictions   Weight Bearing Restrictions  No      Balance Screen   Has the patient fallen in the past 6 months  No    Has the patient had a decrease in activity level because of a fear of falling?   No    Is the patient reluctant to leave their home because of a fear of falling?   No      Home Environment   Living Environment  Private residence    Living Arrangements  Alone    Type of [place] Access  Stairs to enter    Entrance Stairs-Number of Steps  3    Entrance Stairs-Rails  Right    Home Layout  Two level    Alternate Level Stairs-Number of Steps  14    Alternate Level Stairs-Rails  Right    Additional Comments  rowing machine      Prior Function   Level of Independence  Independent    Vocation  Retired    Office manager,  garden, Haematologist, travel      Cognition   Overall Cognitive Status  Within Functional Limits for tasks assessed      Observation/Other Assessments   Focus on Therapeutic Outcomes (FOTO)   --   wasn't entered in [place], needs next visit     Sensation   Light Touch  Appears Intact      Posture/Postural Control   Posture/Postural Control  Postural limitations    Postural Limitations  Weight shift left;Right pelvic obliquity      ROM / Strength   AROM / PROM / Strength  Strength;AROM      AROM   Overall AROM Comments  bil hip ER limited by 30%, bil trunk SB 10 deg, trunk ext 5 deg, trunk flexion [facility]      Strength   Overall Strength Comments  unable to perform Rt heel raise    Strength Assessment Site  Hip;Knee    Right/Left Hip  Right;Left    Right Hip Flexion  3+/5    Right Hip Extension  4-/5    Right Hip External Rotation   4-/5    Right Hip Internal Rotation  4/5    Right Hip ABduction  3+/5    Right Hip ADduction  4/5    Left Hip Flexion  3/5    Left Hip Extension  4-/5    Left Hip External Rotation  4-/5    Left Hip Internal Rotation  4/5    Left Hip ABduction  3+/5    Left Hip ADduction  4/5    Right/Left Knee  Right;Left    Right Knee Flexion  4-/5    Right Knee Extension  4+/5    Left Knee Flexion  4-/5    Left Knee Extension  4+/5      Flexibility   Soft Tissue Assessment /Muscle Length  yes    Hamstrings  limited end range bil    Piriformis  limited by 30%      Ambulation/Gait   Ambulation/Gait  Yes    Assistive device  None    Gait Pattern  Step-through pattern    Stairs  Yes  Stairs Assistance  7: Independent    Stair Management Technique  Alternating pattern;One rail Right   increased effort going up bil LEs, uses single UE, momentum   Number of Stairs  12    Height of Stairs  6    Gait Comments  + Trendelenburg Rt hip, abrupt WB through Rt LE      Standardized Balance Assessment   Standardized Balance Assessment  Five Times Sit to Stand     Five times sit to stand comments   8 sec, no UEs                Objective measurements completed on examination: See above findings.              PT Education - 11/26/18 0948    Education Details  Access Code: [email or web address]    Person(s) Educated  Patient    Methods  Explanation;Demonstration;Verbal cues;Handout    Comprehension  Verbalized understanding;Returned demonstration       PT Short Term Goals - 11/26/18 1157      PT SHORT TERM GOAL #1   Title  Pt ind in initial HEP.    Time  3    Period  Weeks    Status  New    Target Date  12/17/18      PT SHORT TERM GOAL #2   Title  Pt will perform Rt SLS with UE support with improved control of Rt hip abductors (negative Trendelenberg).    Time  3    Period  Weeks    Status  New    Target Date  12/17/18      PT SHORT TERM GOAL #3   Title  Pt will report at least 20% improvement in fatigue when climbing flight of stairs (14 steps) at home secondary to improving strength of bil LEs.    Time  4    Period  Weeks    Status  New    Target Date  12/24/18      PT SHORT TERM GOAL #4   Title  Pt will do FOTO survey and LTG will be set    Time  1    Period  Weeks    Status  New    Target Date  12/03/18        PT Long Term Goals - 11/26/18 1200      PT LONG TERM GOAL #1   Title  Pt will be ind in advanced HEP and understand how to safely progress.    Time  8    Period  Weeks    Status  New    Target Date  01/21/19      PT LONG TERM GOAL #2   Title  Pt will be able to perform walking/standing activities for at least 30 min before reaching LE fatigue requiring a seated break.    Time  8    Period  Weeks    Status  New    Target Date  01/21/19      PT LONG TERM GOAL #3   Title  Pt will report at least 70% improvement in fatigue levels with climbing flight of stairs at home (14 steps).    Time  8    Period  Weeks    Status  New    Target Date  01/21/19      PT LONG TERM GOAL #4   Title  Pt will achieve  at least 4+/5 in all LE muscle  groups to improve tolerance of desired daily tasks and recreation activities including golf, gardening, yardwork.    Time  8    Period  Weeks    Status  New    Target Date  01/21/19      PT LONG TERM GOAL #5   Title  Pt will achieve bil hip ROM to [facility] to reduce strain on lumbar spine.    Time  8    Period  Weeks    Status  New    Target Date  01/21/19             Plan - 11/26/18 1047    Clinical Impression Statement  Pt is a very active 73yo female who reports chronic LBP which no longer radiates into bil LEs.  Of note, she is concerned about weakness in bil LEs despite how active she is.  Tolerance for walking has reduced to approx 15 min, and she feels completely fatigued in bil LEs after climbing 14 stair flight in home.  She has gait asymmetry and dysfunction, + Trendelenberg on Rt in SLS/with climbing stairs/gait, uses momentum and UEs to climb stairs, and signif weakness in bil LEs Rt>Lt.  She is unable to perform Rt heel raise and has signif weakness in Rt LE in hip abductors, hamstrings and gluteals.  5x sit to stand is 8 sec without UEs.  Trunk ROM (bil SB, ext > flexion) and bil hip ER is limited but not painful.  Pt will benefit from skilled PT for strength, ROM (lumbar flexion for stenosis, hip ER), gait and stair training and body mechanics instruction for yard/gardening to improve tolerance of daily demands and activities.    Personal Factors and Comorbidities  Time since onset of injury/illness/exacerbation    Examination-Activity Limitations  Locomotion Level;Stairs    Examination-Participation Restrictions  Community Activity    Stability/Clinical Decision Making  Stable/Uncomplicated    Clinical Decision Making  Low    Rehab Potential  Excellent    PT Frequency  2x / week    PT Duration  8 weeks    PT Treatment/Interventions  ADLs/Self Care Home Management;Cryotherapy;Electrical Stimulation;Iontophoresis 4mg /ml Dexamethasone;Moist  Heat;Traction;Gait training;Stair training;Functional mobility training;Therapeutic activities;Therapeutic exercise;Balance training;Neuromuscular re-education;Patient/family education;Manual techniques;Passive range of motion;Dry needling;Taping;Spinal Manipulations;Joint Manipulations    PT Next Visit Plan  do FOTO (wasn't in system), NuStep, review HEP from initial eval, continue LE strength and flexibility, gait/stair training    PT Home Exercise Plan  Access Code: [email or web address]    Consulted and Agree with Plan of Care  Patient       Patient will benefit from skilled therapeutic intervention in order to improve the following deficits and impairments:  Abnormal gait, Decreased range of motion, Difficulty walking, Decreased endurance, Decreased activity tolerance, Pain, Impaired flexibility, Decreased balance, Hypomobility, Improper body mechanics, Decreased mobility, Decreased strength, Postural dysfunction  Visit Diagnosis: 1. Muscle weakness (generalized)   2. Chronic bilateral low back pain without sciatica   3. Other abnormalities of gait and mobility        Problem List Patient Active Problem List   Diagnosis Date Noted  . Palpitations 09/20/2018  . Chronic systolic CHF (congestive heart failure) ([place]) 01/27/2017  . Pleural effusion on right 12/03/2016  . CHF (congestive heart failure), NYHA class I, acute, systolic ([place]) 82/99/3716  . Acute CHF ([place]) 10/26/2016  . Plantar fasciitis, bilateral 03/17/2013  . ANXIETY STATE, UNSPECIFIED 03/27/2009  . Hyperlipidemia 05/04/2007  . Benign essential HTN 05/04/2007  . ALLERGIC RHINITIS 05/04/2007  .  Asthma 05/04/2007  . Osteoarthritis 05/04/2007   Baruch Merl, PT 11/26/18 12:07 PM   [facility] [address] [place], [place], [address] Phone: [phone]   Fax:  [phone]  Name: Tina May MRN: [id number] Date of Birth: 06-11-1945

## 2018-11-26 NOTE — Patient Instructions (Signed)
Access Code: [email or web address]  URL: [email or web address]  Date: 11/26/2018  Prepared by: Venetia Night Jeovany Huitron   Exercises  Supine Posterior Pelvic Tilt - 10 reps - 3 sets - 3 hold - 1x daily - 7x weekly  Supine Double Knee to Chest - 10 reps - 2 sets - 3 hold - 1x daily - 7x weekly  Supine Bridge - 10 reps - 3 sets - 1x daily - 7x weekly  Supine Piriformis Stretch with Leg Straight - 3 reps - 3 sets - 30 hold - 1x daily - 7x weekly  Heel rises with counter support - 10 reps - 3 sets - 1x daily - 7x weekly  Standing Hip Hiking - 10 reps - 1 sets - 5 hold - 1x daily - 7x weekly

## 2018-11-29 ENCOUNTER — Telehealth: Payer: Self-pay | Admitting: Cardiovascular Disease

## 2018-11-29 DIAGNOSIS — I5021 Acute systolic (congestive) heart failure: Secondary | ICD-10-CM

## 2018-11-29 DIAGNOSIS — I10 Essential (primary) hypertension: Secondary | ICD-10-CM

## 2018-11-29 MED ORDER — SPIRONOLACTONE 25 MG PO TABS: 25.0000 mg | ORAL_TABLET | Freq: Every day | ORAL | 3 refills | Status: DC

## 2018-11-29 NOTE — Telephone Encounter (Signed)
Lets start spironolactone 25 mg a day .  Check BMP in 1 week

## 2018-11-29 NOTE — Telephone Encounter (Signed)
° °  Pt c/o medication issue:  1. Name of Medication:  metoprolol succinate (TOPROL-XL) 50 MG 24 hr tablet  2. How are you currently taking this medication (dosage and times per day)? 50 mg once daily  3. Are you having a reaction (difficulty breathing--STAT)?   4. What is your medication issue? High hr and bp   STAT if HR is under 50 or over 120 (normal HR is 60-100 beats per minute)  1) What is your heart rate? 97  2) Do you have a log of your heart rate readings (document readings)?  See below  3) Do you have any other symptoms? High BP  Pt c/o BP issue: STAT if pt c/o blurred vision, one-sided weakness or slurred speech  1. What are your last 5 BP readings?  Thursday 137/85 HR 87 Friday 143/89 HR 73 Saturday 147/85 HR 79 Sunday 152/93 HR 86 Monday 142/89 HR 97  2. Are you having any other symptoms (ex. Dizziness, headache, blurred vision, passed out)? no  3. What is your BP issue? High BP  And pulse (for the patient). The patient states it has never been this high when she was on her other medication

## 2018-11-29 NOTE — Telephone Encounter (Signed)
Pt aware of recommendations and will have repeat lab done at the [facility] office ./cy

## 2018-11-29 NOTE — Telephone Encounter (Signed)
Spoke with Tina May and all the readings below are before meds.I had Tina May check B/P just now 1 hour after meds and B/P was 164/98 and HR 86 Per Tina May has cut out caffeine and monitors salt intake does eat 2 slices of bacon once a week Per Tina May feels bad when HR is high Will forward to Dr Acie Fredrickson for review ad recommendations ./cy Metoprolol 50 mg daily  Entresto 97/103 mg curremtly

## 2018-12-01 ENCOUNTER — Other Ambulatory Visit: Payer: Self-pay

## 2018-12-01 DIAGNOSIS — I5021 Acute systolic (congestive) heart failure: Secondary | ICD-10-CM | POA: Diagnosis not present

## 2018-12-01 DIAGNOSIS — N83291 Other ovarian cyst, right side: Secondary | ICD-10-CM | POA: Diagnosis not present

## 2018-12-01 DIAGNOSIS — I10 Essential (primary) hypertension: Secondary | ICD-10-CM | POA: Diagnosis not present

## 2018-12-02 LAB — BASIC METABOLIC PANEL
BUN/Creatinine Ratio: 35 — ABNORMAL HIGH (ref 12–28)
BUN: 24 mg/dL (ref 8–27)
CO2: 23 mmol/L (ref 20–29)
Calcium: 9.7 mg/dL (ref 8.7–10.3)
Chloride: 102 mmol/L (ref 96–106)
Creatinine, Ser: 0.68 mg/dL (ref 0.57–1.00)
GFR calc Af Amer: 100 mL/min/{1.73_m2} (ref >59)
GFR calc non Af Amer: 87 mL/min/{1.73_m2} (ref >59)
Glucose: 95 mg/dL (ref 65–99)
Potassium: 4.3 mmol/L (ref 3.5–5.2)
Sodium: 143 mmol/L (ref 134–144)

## 2018-12-03 ENCOUNTER — Telehealth: Payer: Self-pay | Admitting: Cardiovascular Disease

## 2018-12-03 NOTE — Telephone Encounter (Signed)
Follow Up   Patient calling back in stating that the medication is not working to regulate her blood pressure. Patient states that she is still having high blood pressure readings in the middle of the day. Please give patient a call back to assist.

## 2018-12-03 NOTE — Telephone Encounter (Signed)
Lm to call back ./cy 

## 2018-12-03 NOTE — Telephone Encounter (Signed)
Follow up ° ° °Patient is returning call. Please call. °

## 2018-12-06 ENCOUNTER — Encounter: Payer: Self-pay | Admitting: Physical Therapy

## 2018-12-06 ENCOUNTER — Other Ambulatory Visit: Payer: Self-pay

## 2018-12-06 ENCOUNTER — Ambulatory Visit: Payer: PPO | Attending: Family Medicine | Admitting: Physical Therapy

## 2018-12-06 ENCOUNTER — Telehealth: Payer: Self-pay | Admitting: *Deleted

## 2018-12-06 DIAGNOSIS — M6281 Muscle weakness (generalized): Secondary | ICD-10-CM | POA: Insufficient documentation

## 2018-12-06 DIAGNOSIS — M545 Low back pain, unspecified: Secondary | ICD-10-CM

## 2018-12-06 DIAGNOSIS — G8929 Other chronic pain: Secondary | ICD-10-CM | POA: Diagnosis not present

## 2018-12-06 DIAGNOSIS — R2689 Other abnormalities of gait and mobility: Secondary | ICD-10-CM | POA: Insufficient documentation

## 2018-12-06 NOTE — Telephone Encounter (Signed)
Spoke with pt who reports she was recently taking Coreg which was d/c d/t upset stomach.  That was on 11/12/2018 and she was changed to Metoprolol.  Advised pt I will review with Dr Acie Fredrickson and call her back.  Aware in the meanwhile to continue current medications, avoiding salt and sodium in foods, continue exercising and continue to monitor BP.  Pt was in agreement.

## 2018-12-06 NOTE — Telephone Encounter (Signed)
  Patient is calling back to follow up from prior phone note regarding her medication

## 2018-12-06 NOTE — Patient Instructions (Signed)
Access Code: [email or web address]  URL: [email or web address]  Date: 12/06/2018  Prepared by: Earlie Counts   Exercises Supine Posterior Pelvic Tilt - 10 reps - 3 sets - 3 hold - 1x daily - 7x weekly Supine Double Knee to Chest - 2 reps - 1 sets - 15 sec hold - 1x daily - 7x weekly Supine Bridge - 5 reps - 1 sets - 1x daily - 7x weekly Supine Piriformis Stretch with Leg Straight - 2 reps - 1 sets - 30 hold - 1x daily - 7x weekly Heel rises with counter support - 10 reps - 3 sets - 1x daily - 7x weekly Standing Hip Hiking - 10 reps - 1 sets - 5 hold - 1x daily - 7x weekly Supine Hamstring Stretch - 2 reps - 1 sets - 30 sec hold - 1x daily - 7x weekly [facility] [address] [place], [place] [address] Phone # [phone] Fax [phone]

## 2018-12-06 NOTE — Therapy (Signed)
[facility] [address] [place], [place], [address] Phone: [phone]   Fax:  [phone]  Physical Therapy Treatment  Patient Details  Name: Tina May MRN: [id number] Date of Birth: 1945/05/07 Referring Provider (PT): [name], MD   Encounter Date: 12/06/2018  PT End of Session - 12/06/18 0940    Visit Number  2    Date for PT Re-Evaluation  01/21/19    Authorization Type  [employer]    PT Start Time  0900    PT Stop Time  0940    PT Time Calculation (min)  40 min    Activity Tolerance  Patient tolerated treatment well    Behavior During Therapy  [facility] for tasks assessed/performed       Past Medical History:  Diagnosis Date  . Allergy   . Arthritis   . Asthma   . 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 Cataract    starting  . Diverticulitis   . Diverticulosis   . External hemorrhoids   . Hepatic cyst   . Hyperlipidemia   . Hypertension   . Pancreatic lesion 03/11/13    Past Surgical History:  Procedure Laterality Date  . ABDOMINAL HYSTERECTOMY    . COLONOSCOPY  01/25/2014   per Dr. [name], diverticulae only, repeat in 10 yrs   . LEFT HEART CATH AND CORONARY ANGIOGRAPHY N/A 01/27/2017   Procedure: LEFT HEART CATH AND CORONARY ANGIOGRAPHY;  Surgeon: Martinique, Peter M, MD;  Location: [facility];  Service: Cardiovascular;  Laterality: N/A;  . SMALL INTESTINE SURGERY     from small intestine    There were no vitals filed for this visit.  Subjective Assessment - 12/06/18 0906    Subjective  I have a achy back but my legs are very weak.    How long can you walk comfortably?  leg weakness with stairs and walking - 15 min walking, 14 steps    Diagnostic tests  MRI 11/2018 multi-level spondylosis, diffuse central and lateral stenosis, advanced facet hypertrophy    Patient Stated Goals  get legs stronger, keep up with desired level of activity    Currently in Pain?  Yes    Pain Score   1     Pain Location  Back    Pain Orientation  Right;Left;Lower;Mid    Pain Descriptors / Indicators  Aching    Pain Type  Chronic pain    Pain Onset  More than a month ago    Pain Frequency  Intermittent    Aggravating Factors   as day goes on ( active dat) back gets more tired and achey    Pain Relieving Factors  take a break, sit down    Multiple Pain Sites  No                       [facility] Adult PT Treatment/Exercise - 12/06/18 0001      Lumbar Exercises: Stretches   Passive Hamstring Stretch  Right;Left;2 reps;30 seconds    Passive Hamstring Stretch Limitations  supine    Double Knee to Chest Stretch  1 rep;30 seconds    Piriformis Stretch  Right;Left;2 reps;30 seconds    Piriformis Stretch Limitations  supine with opposite leg straight      Lumbar Exercises: Aerobic   Nustep  6 min, level 3 seat # 6 just legs      Lumbar Exercises: Standing  Heel Raises  20 reps;1 second    Other Standing Lumbar Exercises  hip hike on right 10x with VC to not lean trunk    Other Standing Lumbar Exercises  lift opposite arm and leg leaning on mat      Lumbar Exercises: Supine   Pelvic Tilt  10 reps   with breath   Pelvic Tilt Limitations  do not raise hips VC    Glut Set  10 reps;5 seconds    Glut Set Limitations  knees on bolster    Clam  10 reps   each leg   Bridge  5 reps    Bridge Limitations  stopped due to cramps in legs,     Isometric Hip Flexion  10 reps;5 seconds   each leg     Knee/Hip Exercises: Supine   Quad Sets  Strengthening;Right;Left;1 set;5 reps   hold 5 sec   Straight Leg Raises  Strengthening;Right;Left;1 set;10 reps    Straight Leg Raises Limitations  had cramps             PT Education - 12/06/18 0929    Education Details  Access Code: [email or web address]    Person(s) Educated  Patient    Methods  Explanation;Demonstration;Verbal cues;Handout    Comprehension  Returned demonstration;Verbalized understanding       PT Short Term Goals -  12/06/18 0943      PT SHORT TERM GOAL #1   Title  Pt ind in initial HEP.    Time  3    Period  Weeks    Status  On-going      PT SHORT TERM GOAL #2   Title  Pt will perform Rt SLS with UE support with improved control of Rt hip abductors (negative Trendelenberg).    Time  3    Period  Weeks    Status  On-going    Target Date  12/17/18      PT SHORT TERM GOAL #3   Title  Pt will report at least 20% improvement in fatigue when climbing flight of stairs (14 steps) at home secondary to improving strength of bil LEs.    Time  4    Period  Weeks    Status  On-going    Target Date  12/24/18        PT Long Term Goals - 11/26/18 1200      PT LONG TERM GOAL #1   Title  Pt will be ind in advanced HEP and understand how to safely progress.    Time  8    Period  Weeks    Status  New    Target Date  01/21/19      PT LONG TERM GOAL #2   Title  Pt will be able to perform walking/standing activities for at least 30 min before reaching LE fatigue requiring a seated break.    Time  8    Period  Weeks    Status  New    Target Date  01/21/19      PT LONG TERM GOAL #3   Title  Pt will report at least 70% improvement in fatigue levels with climbing flight of stairs at home (14 steps).    Time  8    Period  Weeks    Status  New    Target Date  01/21/19      PT LONG TERM GOAL #4   Title  Pt will achieve at least 4+/5 in all LE muscle  groups to improve tolerance of desired daily tasks and recreation activities including golf, gardening, yardwork.    Time  8    Period  Weeks    Status  New    Target Date  01/21/19      PT LONG TERM GOAL #5   Title  Pt will achieve bil hip ROM to [facility] to reduce strain on lumbar spine.    Time  8    Period  Weeks    Status  New    Target Date  01/21/19            Plan - 12/06/18 0941    Clinical Impression Statement  Patient home exercise program was modified due to leg cramps and reducing reps for the stretches. Patient is only able to do 5  bridges prior to her legs cramping. Patient walks with decreased trunk extension and limp on right. Patient is able to contract her abdominals for the stabilization exercises. Patient will benefit from skilled therapy to improve tolerance of daily demands and activities.    Personal Factors and Comorbidities  Time since onset of injury/illness/exacerbation    Examination-Activity Limitations  Locomotion Level;Stairs    Examination-Participation Restrictions  Community Activity    Stability/Clinical Decision Making  Stable/Uncomplicated    Rehab Potential  Excellent    PT Frequency  2x / week    PT Duration  8 weeks    PT Treatment/Interventions  ADLs/Self Care Home Management;Cryotherapy;Electrical Stimulation;Iontophoresis 4mg /ml Dexamethasone;Moist Heat;Traction;Gait training;Stair training;Functional mobility training;Therapeutic activities;Therapeutic exercise;Balance training;Neuromuscular re-education;Patient/family education;Manual techniques;Passive range of motion;Dry needling;Taping;Spinal Manipulations;Joint Manipulations    PT Next Visit Plan  NuStep,  continue LE strength and flexibility, gait/stair training    PT Home Exercise Plan  Access Code: [email or web address]    Recommended Other Services  MD signed initial eval    Consulted and Agree with Plan of Care  Patient       Patient will benefit from skilled therapeutic intervention in order to improve the following deficits and impairments:  Abnormal gait, Decreased range of motion, Difficulty walking, Decreased endurance, Decreased activity tolerance, Pain, Impaired flexibility, Decreased balance, Hypomobility, Improper body mechanics, Decreased mobility, Decreased strength, Postural dysfunction  Visit Diagnosis: 1. Chronic bilateral low back pain without sciatica   2. Muscle weakness (generalized)   3. Other abnormalities of gait and mobility        Problem List Patient Active Problem List   Diagnosis Date Noted  . Palpitations  09/20/2018  . Chronic systolic CHF (congestive heart failure) ([place]) 01/27/2017  . Pleural effusion on right 12/03/2016  . CHF (congestive heart failure), NYHA class I, acute, systolic ([place]) 10/62/6948  . Acute CHF ([place]) 10/26/2016  . Plantar fasciitis, bilateral 03/17/2013  . ANXIETY STATE, UNSPECIFIED 03/27/2009  . Hyperlipidemia 05/04/2007  . Benign essential HTN 05/04/2007  . ALLERGIC RHINITIS 05/04/2007  . Asthma 05/04/2007  . Osteoarthritis 05/04/2007    Earlie Counts, PT 12/06/18 9:45 AM    [facility] [address], [place], [place], [address] Phone: [phone]   Fax:  [phone]  Name: Tina May MRN: [id number] Date of Birth: Feb 06, 1946

## 2018-12-06 NOTE — Telephone Encounter (Signed)
Left message on VM to c/b to discuss medication changes and Dr Elmarie Shiley recommendations.

## 2018-12-06 NOTE — Telephone Encounter (Signed)
Lets DC metoprolol and Start Coreg 25 mg PO BID Make sure she is avoiding salt. Continue to monitor ,  We will consider increasing the spironolactone or adding HCTZ if BP remains elevated.  Is she getting aerobic exercise

## 2018-12-06 NOTE — Telephone Encounter (Signed)
LMTCB

## 2018-12-06 NOTE — Telephone Encounter (Signed)
Please see other phone note from today.

## 2018-12-06 NOTE — Telephone Encounter (Signed)
Patient had called back last Friday to report continued elevation in BP.  This RN called pt in follow this morning.  Despite taking Entresto 97-103 mg BID, Metoprolol succinate 50 mg daily and starting Spironolactone 25 mg daily she has not seen any improvement in BP.  She states "My blood pressure is just as bad when I get up as it is 2 hours after I've taken my medications." 7/30 147/93 in AM         140/100 in PM 7/31 142/88 in AM HR 82         142/82 in PM 8/1   142/89 in AM HR 95         142/81 in PM 8/2   145/92 in AM HR 82         151/81 in PM HR 89 This AM 152/85 HR 87.  She reports the only difference she has noticed since starting Spironolactone has been a jittery feeling and headache after yard work or playing golf (3 times a week). Advised to continue to monitor and I will notify Dr Acie Fredrickson for review and c/b with further instructions/orders.  Pt states understanding.

## 2018-12-07 MED ORDER — BISOPROLOL FUMARATE 5 MG PO TABS: 5.0000 mg | ORAL_TABLET | Freq: Every day | ORAL | 11 refills | Status: DC

## 2018-12-07 NOTE — Telephone Encounter (Signed)
No answer on home phone Left message on cell requesting that patient call back and ask for me

## 2018-12-07 NOTE — Telephone Encounter (Signed)
Pt reminded [place] that she has tried coreg in the past but did not tolerate it Is on metoprolol XL 50 mg a day HR remains a bit elevated Please increase Toprol XL to 100 mg a day If her BP is still elevated, will consider increasing her Spironolactone

## 2018-12-07 NOTE — Telephone Encounter (Signed)
Left detailed message with medication instructions to stop metoprolol succinate and start bisoprolol 5 mg daily on patient's voice mail Advised that I am scheduling a follow-up with our pharmacist so that BP can be monitored and medication can be adjusted as needed. Asked her to call back to the office with questions or concerns.

## 2018-12-07 NOTE — Telephone Encounter (Signed)
Spoke with patient who states BP continues to be high despite recent addition of spironolactone. She was taken off carvedilol and changed to metoprolol succinate 50 mg at her last ov on 7/10 due to stomach upset. She states she is very careful with her diet to avoid salt and processed foods. Is very active doing yard work, Marketing executive and using the elliptical. Denies use of meloxicam or ibuprofen x past 3 weeks. States she continues to have GI distress with metoprolol succinate although it is not as bad as with carvedilol. She is seeing a GI doctor in 3 weeks. States systolic BP is >034 at various times during the day.  States she drinks 8 8oz glasses of water daily and drinks water with electrolytes when she has been outside working. She only takes furosemide as needed and denies swelling at present. She asks if there is a different beta-blocker that is less likely to cause GI upset and improve BP. I advised that I will reach out to our pharmacists for advice and call her back. She verbalized understanding and agreement and thanked me for the call.

## 2018-12-07 NOTE — Telephone Encounter (Signed)
Bisoprolol is the only other beta blocker approved in [place] that pt has not tried yet - can try stopping metoprolol and starting bisoprolol 5mg  daily. If BP remains elevated and HR allows, can increase bisoprolol to 10mg  daily or increase spironolactone to 50mg  daily to target BP < 130/109mmHg.

## 2018-12-08 ENCOUNTER — Ambulatory Visit: Payer: PPO

## 2018-12-08 ENCOUNTER — Other Ambulatory Visit: Payer: Self-pay

## 2018-12-08 DIAGNOSIS — M6281 Muscle weakness (generalized): Secondary | ICD-10-CM

## 2018-12-08 DIAGNOSIS — M545 Low back pain: Secondary | ICD-10-CM | POA: Diagnosis not present

## 2018-12-08 DIAGNOSIS — G8929 Other chronic pain: Secondary | ICD-10-CM

## 2018-12-08 DIAGNOSIS — R2689 Other abnormalities of gait and mobility: Secondary | ICD-10-CM

## 2018-12-08 NOTE — Telephone Encounter (Signed)
Called back and spoke with patient who is aware of orders and f/u appt as scheduled.  She asked if she should continue taking Spironolactone as listed.  Advised Dr Acie Fredrickson did not give orders to d/c it at this time.  Pt denies any further questions.  Will continue to monitor her BP and call back prior to scheduled appt if any questions or concerns,

## 2018-12-08 NOTE — Telephone Encounter (Signed)
Follow up    Pt is returning call, please call back

## 2018-12-08 NOTE — Therapy (Signed)
[facility] [address], [place], [place], [address] Phone: [phone]   Fax:  [phone]  Physical Therapy Treatment  Patient Details  Name: Tina May MRN: [id number] Date of Birth: [birth year] Referring Provider (PT): [name], MD   Encounter Date: 12/08/2018  PT End of Session - 12/08/18 1442    Visit Number  3    Date for PT Re-Evaluation  01/21/19    Authorization Type  [employer]    PT Start Time  1400    PT Stop Time  1441    PT Time Calculation (min)  41 min    Activity Tolerance  Patient tolerated treatment well    Behavior During Therapy  [facility] for tasks assessed/performed       Past Medical History:  Diagnosis Date  . Allergy   . Arthritis   . Asthma   . 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 Cataract    starting  . Diverticulitis   . Diverticulosis   . External hemorrhoids   . Hepatic cyst   . Hyperlipidemia   . Hypertension   . Pancreatic lesion 03/11/13    Past Surgical History:  Procedure Laterality Date  . ABDOMINAL HYSTERECTOMY    . COLONOSCOPY  01/25/2014   per Dr. [name], diverticulae only, repeat in 10 yrs   . LEFT HEART CATH AND CORONARY ANGIOGRAPHY N/A 01/27/2017   Procedure: LEFT HEART CATH AND CORONARY ANGIOGRAPHY;  Surgeon: Martinique, Peter M, MD;  Location: [facility];  Service: Cardiovascular;  Laterality: N/A;  . SMALL INTESTINE SURGERY     from small intestine    There were no vitals filed for this visit.  Subjective Assessment - 12/08/18 1400    Subjective  I'm doing OK.  The modifications made last session were good.    Currently in Pain?  Yes    Pain Score  2     Pain Location  Back    Pain Orientation  Left;Right    Pain Descriptors / Indicators  Aching    Pain Type  Chronic pain                       [facility] Adult PT Treatment/Exercise - 12/08/18 0001      Exercises   Exercises  Knee/Hip;Lumbar;Ankle      Lumbar  Exercises: Stretches   Piriformis Stretch  Right;Left;2 reps;30 seconds    Piriformis Stretch Limitations  supine with opposite leg straight      Lumbar Exercises: Aerobic   Nustep  8 min, level 3 seat # 6 just legs   PT present to discuss progress     Lumbar Exercises: Standing   Heel Raises  20 reps;1 second      Knee/Hip Exercises: Standing   Forward Step Up  2 sets;10 reps;Both    Rebounder  weight shifting 3 ways x 1 minute      Knee/Hip Exercises: Seated   Long Arc Quad  Strengthening;Both;2 sets;10 reps    Sit to [employer]  without UE support;10 reps;2 sets      Knee/Hip Exercises: Supine   Quad Sets  Strengthening;Right;Left;1 set;5 reps   hold 5 sec   Straight Leg Raises  Strengthening;Right;Left;1 set;10 reps      Knee/Hip Exercises: Sidelying   Clams  2x10               PT Short Term Goals - 12/06/18 8938  PT SHORT TERM GOAL #1   Title  Pt ind in initial HEP.    Time  3    Period  Weeks    Status  On-going      PT SHORT TERM GOAL #2   Title  Pt will perform Rt SLS with UE support with improved control of Rt hip abductors (negative Trendelenberg).    Time  3    Period  Weeks    Status  On-going    Target Date  12/17/18      PT SHORT TERM GOAL #3   Title  Pt will report at least 20% improvement in fatigue when climbing flight of stairs (14 steps) at home secondary to improving strength of bil LEs.    Time  4    Period  Weeks    Status  On-going    Target Date  12/24/18        PT Long Term Goals - 11/26/18 1200      PT LONG TERM GOAL #1   Title  Pt will be ind in advanced HEP and understand how to safely progress.    Time  8    Period  Weeks    Status  New    Target Date  01/21/19      PT LONG TERM GOAL #2   Title  Pt will be able to perform walking/standing activities for at least 30 min before reaching LE fatigue requiring a seated break.    Time  8    Period  Weeks    Status  New    Target Date  01/21/19      PT LONG TERM GOAL #3    Title  Pt will report at least 70% improvement in fatigue levels with climbing flight of stairs at home (14 steps).    Time  8    Period  Weeks    Status  New    Target Date  01/21/19      PT LONG TERM GOAL #4   Title  Pt will achieve at least 4+/5 in all LE muscle groups to improve tolerance of desired daily tasks and recreation activities including golf, gardening, yardwork.    Time  8    Period  Weeks    Status  New    Target Date  01/21/19      PT LONG TERM GOAL #5   Title  Pt will achieve bil hip ROM to [facility] to reduce strain on lumbar spine.    Time  8    Period  Weeks    Status  New    Target Date  01/21/19            Plan - 12/08/18 1407    Clinical Impression Statement  Pt with continued LE weakness and is working on strength exercise as able.  Pt with LE cramping with some exercises at home and is modifying the number of repetitions.  Pt remains active at home with golf, rowing and walking.  Pt requires minor verbal cues for technique with exercises. Pt is challenged with hip hike when in Rt single limb stance and heel raises.   Pt will continue to benefit from skilled PT for strength and flexibility.    PT Frequency  2x / week    PT Duration  8 weeks    PT Treatment/Interventions  ADLs/Self Care Home Management;Cryotherapy;Electrical Stimulation;Iontophoresis 4mg /ml Dexamethasone;Moist Heat;Traction;Gait training;Stair training;Functional mobility training;Therapeutic activities;Therapeutic exercise;Balance training;Neuromuscular re-education;Patient/family education;Manual techniques;Passive range of motion;Dry  needling;Taping;Spinal Manipulations;Joint Manipulations    PT Next Visit Plan  NuStep,  continue LE strength and flexibility, gait/stair training    PT Home Exercise Plan  Access Code: [email or web address]    Consulted and Agree with Plan of Care  Patient       Patient will benefit from skilled therapeutic intervention in order to improve the following deficits and  impairments:  Abnormal gait, Decreased range of motion, Difficulty walking, Decreased endurance, Decreased activity tolerance, Pain, Impaired flexibility, Decreased balance, Hypomobility, Improper body mechanics, Decreased mobility, Decreased strength, Postural dysfunction  Visit Diagnosis: 1. Muscle weakness (generalized)   2. Other abnormalities of gait and mobility   3. Chronic bilateral low back pain without sciatica        Problem List Patient Active Problem List   Diagnosis Date Noted  . Palpitations 09/20/2018  . Chronic systolic CHF (congestive heart failure) ([place]) 01/27/2017  . Pleural effusion on right 12/03/2016  . CHF (congestive heart failure), NYHA class I, acute, systolic ([place]) 27/25/3664  . Acute CHF ([place]) 10/26/2016  . Plantar fasciitis, bilateral 03/17/2013  . ANXIETY STATE, UNSPECIFIED 03/27/2009  . Hyperlipidemia 05/04/2007  . Benign essential HTN 05/04/2007  . ALLERGIC RHINITIS 05/04/2007  . Asthma 05/04/2007  . Osteoarthritis 05/04/2007     Sigurd Sos, PT 12/08/18 2:43 PM  [facility] [address], [place], [place], [address] Phone: [phone]   Fax:  [phone]  Name: Tina May MRN: [id number] Date of Birth: [birth year]

## 2018-12-13 ENCOUNTER — Ambulatory Visit: Payer: PPO | Admitting: Physical Therapy

## 2018-12-13 ENCOUNTER — Other Ambulatory Visit: Payer: Self-pay

## 2018-12-13 ENCOUNTER — Encounter: Payer: Self-pay | Admitting: Physical Therapy

## 2018-12-13 DIAGNOSIS — G8929 Other chronic pain: Secondary | ICD-10-CM

## 2018-12-13 DIAGNOSIS — R2689 Other abnormalities of gait and mobility: Secondary | ICD-10-CM

## 2018-12-13 DIAGNOSIS — M545 Low back pain: Secondary | ICD-10-CM | POA: Diagnosis not present

## 2018-12-13 DIAGNOSIS — M6281 Muscle weakness (generalized): Secondary | ICD-10-CM

## 2018-12-13 NOTE — Therapy (Signed)
[facility] [address], [place], [place], [address] Phone: [phone]   Fax:  [phone]  Physical Therapy Treatment  Patient Details  Name: Tina May MRN: [id number] Date of Birth: [birth year] Referring Provider (PT): [name], MD   Encounter Date: 12/13/2018  PT End of Session - 12/13/18 0937    Visit Number  4    Date for PT Re-Evaluation  01/21/19    Authorization Type  [employer]    PT Start Time  0940    PT Stop Time  1018    PT Time Calculation (min)  38 min    Activity Tolerance  Patient tolerated treatment well    Behavior During Therapy  [facility] for tasks assessed/performed       Past Medical History:  Diagnosis Date  . Allergy   . Arthritis   . Asthma   . 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 Cataract    starting  . Diverticulitis   . Diverticulosis   . External hemorrhoids   . Hepatic cyst   . Hyperlipidemia   . Hypertension   . Pancreatic lesion 03/11/13    Past Surgical History:  Procedure Laterality Date  . ABDOMINAL HYSTERECTOMY    . COLONOSCOPY  01/25/2014   per Dr. [name], diverticulae only, repeat in 10 yrs   . LEFT HEART CATH AND CORONARY ANGIOGRAPHY N/A 01/27/2017   Procedure: LEFT HEART CATH AND CORONARY ANGIOGRAPHY;  Surgeon: Martinique, Peter M, MD;  Location: [place] [facility];  Service: Cardiovascular;  Laterality: N/A;  . SMALL INTESTINE SURGERY     from small intestine    There were no vitals filed for this visit.  Subjective Assessment - 12/13/18 0943    Subjective  Remaining active.  No real change noted yet in PT - still getting fatigued at the top of my 14 stair flight    How long can you walk comfortably?  leg weakness with stairs and walking - 15 min walking, 14 steps    Diagnostic tests  MRI 11/2018 multi-level spondylosis, diffuse central and lateral stenosis, advanced facet hypertrophy    Patient Stated Goals  get legs stronger, keep up with  desired level of activity    Currently in Pain?  Yes    Pain Score  1     Pain Location  Back    Pain Orientation  Right;Left    Pain Descriptors / Indicators  Aching    Pain Type  Chronic pain    Pain Onset  More than a month ago    Pain Frequency  Intermittent    Aggravating Factors   tired as day progresses    Pain Relieving Factors  sit down    Effect of Pain on Daily Activities  stairs, prolonged walking    Multiple Pain Sites  No                       [facility] Adult PT Treatment/Exercise - 12/13/18 0001      Exercises   Exercises  Lumbar;Knee/Hip      Lumbar Exercises: Aerobic   Nustep  8 min, level 3 seat # 6 just legs      Lumbar Exercises: Standing   Heel Raises  20 reps;1 second      Lumbar Exercises: Seated   Sit to Stand Limitations  3 sets of 5 reps, as squats without sitting in between each rep  Lumbar Exercises: Supine   Glut Set  10 reps;3 seconds    Straight Leg Raise  15 reps    Straight Leg Raises Limitations  3 sets of 5, PT VC to use abs to stabilize      Lumbar Exercises: Sidelying   Clam  Both;10 reps   2 sets   Clam Limitations  green band, added to HEP      Knee/Hip Exercises: Stretches   Active Hamstring Stretch  Left;Right;2 reps;30 seconds    Active Hamstring Stretch Limitations  seated    Piriformis Stretch  Left;Right;2 reps;30 seconds      Knee/Hip Exercises: Standing   Forward Step Up  Both;1 set;10 reps;Hand Hold: 1   hand hold on Lt to simulate home set up   Forward Step Up Limitations  fatigue at 10 bil             PT Education - 12/13/18 1018    Education Details  Access Code: [email or web address]    Person(s) Educated  Patient    Methods  Explanation;Demonstration;Verbal cues    Comprehension  Verbalized understanding;Returned demonstration       PT Short Term Goals - 12/13/18 0944      PT SHORT TERM GOAL #3   Title  Pt will report at least 20% improvement in fatigue when climbing flight of stairs (14 steps)  at home secondary to improving strength of bil LEs.    Baseline  in the AM better, not as day progresses    Status  On-going        PT Long Term Goals - 11/26/18 1200      PT LONG TERM GOAL #1   Title  Pt will be ind in advanced HEP and understand how to safely progress.    Time  8    Period  Weeks    Status  New    Target Date  01/21/19      PT LONG TERM GOAL #2   Title  Pt will be able to perform walking/standing activities for at least 30 min before reaching LE fatigue requiring a seated break.    Time  8    Period  Weeks    Status  New    Target Date  01/21/19      PT LONG TERM GOAL #3   Title  Pt will report at least 70% improvement in fatigue levels with climbing flight of stairs at home (14 steps).    Time  8    Period  Weeks    Status  New    Target Date  01/21/19      PT LONG TERM GOAL #4   Title  Pt will achieve at least 4+/5 in all LE muscle groups to improve tolerance of desired daily tasks and recreation activities including golf, gardening, yardwork.    Time  8    Period  Weeks    Status  New    Target Date  01/21/19      PT LONG TERM GOAL #5   Title  Pt will achieve bil hip ROM to [facility] to reduce strain on lumbar spine.    Time  8    Period  Weeks    Status  New    Target Date  01/21/19            Plan - 12/13/18 1018    Clinical Impression Statement  Pt continues to display signs of neurological fatigue in L5, S1 muscle groups.  She is quick to fatigue in gluteals, hamstrings and gastroc/soleus complex.  She is able to perform 10 rapid step ups each on Rt/Lt before fatigue.  PT added strength progression for hips and quads to HEP today and will continue to monitor gains in L5 and S1 muscle groups closely as POC progresses.    Rehab Potential  Excellent    PT Frequency  2x / week    PT Duration  8 weeks    PT Treatment/Interventions  ADLs/Self Care Home Management;Cryotherapy;Electrical Stimulation;Iontophoresis 4mg /ml Dexamethasone;Moist  Heat;Traction;Gait training;Stair training;Functional mobility training;Therapeutic activities;Therapeutic exercise;Balance training;Neuromuscular re-education;Patient/family education;Manual techniques;Passive range of motion;Dry needling;Taping;Spinal Manipulations;Joint Manipulations    PT Next Visit Plan  NuStep, continue step ups and add to HEP, f/u on updated LE strength added last visit, gluteal strength, monitor for leg cramping    PT Home Exercise Plan  Access Code: [email or web address]    Consulted and Agree with Plan of Care  Patient       Patient will benefit from skilled therapeutic intervention in order to improve the following deficits and impairments:  Abnormal gait, Decreased range of motion, Difficulty walking, Decreased endurance, Decreased activity tolerance, Pain, Impaired flexibility, Decreased balance, Hypomobility, Improper body mechanics, Decreased mobility, Decreased strength, Postural dysfunction  Visit Diagnosis: 1. Muscle weakness (generalized)   2. Other abnormalities of gait and mobility   3. Chronic bilateral low back pain without sciatica        Problem List Patient Active Problem List   Diagnosis Date Noted  . Palpitations 09/20/2018  . Chronic systolic CHF (congestive heart failure) ([place]) 01/27/2017  . Pleural effusion on right 12/03/2016  . CHF (congestive heart failure), NYHA class I, acute, systolic ([place]) 55/97/4163  . Acute CHF ([place]) 10/26/2016  . Plantar fasciitis, bilateral 03/17/2013  . ANXIETY STATE, UNSPECIFIED 03/27/2009  . Hyperlipidemia 05/04/2007  . Benign essential HTN 05/04/2007  . ALLERGIC RHINITIS 05/04/2007  . Asthma 05/04/2007  . Osteoarthritis 05/04/2007    Baruch Merl, PT 12/13/18 10:22 AM   [facility] [address] [place], [place], [address] Phone: [phone]   Fax:  [phone]  Name: MARIYA MOTTLEY MRN: [id number] Date of Birth: [birth year]

## 2018-12-13 NOTE — Patient Instructions (Signed)
Access Code: [email or web address]  URL: [email or web address]  Date: 12/13/2018  Prepared by: Venetia Night Beuhring   Exercises  Supine Posterior Pelvic Tilt - 10 reps - 3 sets - 3 hold - 1x daily - 7x weekly  Supine Double Knee to Chest - 2 reps - 1 sets - 15 sec hold - 1x daily - 7x weekly  Supine Bridge - 5 reps - 1 sets - 1x daily - 7x weekly  Supine Piriformis Stretch with Leg Straight - 2 reps - 1 sets - 30 hold - 1x daily - 7x weekly  Heel rises with counter support - 10 reps - 3 sets - 1x daily - 7x weekly  Standing Hip Hiking - 10 reps - 1 sets - 5 hold - 1x daily - 7x weekly  Supine Hamstring Stretch - 2 reps - 1 sets - 30 sec hold - 1x daily - 7x weekly  Seated Hamstring Stretch - 2 reps - 1 sets - 30 hold - 1x daily - 7x weekly  Sitting Knee Extension with Resistance - 15 reps - 2 sets - 1 hold - 1x daily - 7x weekly  Clamshell with Resistance - 10 reps - 2 sets - 1 hold - 1x daily - 7x weekly

## 2018-12-15 ENCOUNTER — Other Ambulatory Visit: Payer: Self-pay

## 2018-12-15 ENCOUNTER — Encounter: Payer: Self-pay | Admitting: Physical Therapy

## 2018-12-15 ENCOUNTER — Ambulatory Visit: Payer: PPO | Admitting: Physical Therapy

## 2018-12-15 DIAGNOSIS — M545 Low back pain, unspecified: Secondary | ICD-10-CM

## 2018-12-15 DIAGNOSIS — M6281 Muscle weakness (generalized): Secondary | ICD-10-CM

## 2018-12-15 DIAGNOSIS — G8929 Other chronic pain: Secondary | ICD-10-CM

## 2018-12-15 DIAGNOSIS — R2689 Other abnormalities of gait and mobility: Secondary | ICD-10-CM

## 2018-12-15 NOTE — Therapy (Signed)
[facility] [address], [place], [place], [address] Phone: [phone]   Fax:  [phone]  Physical Therapy Treatment  Patient Details  Name: Tina May MRN: [id number] Date of Birth: July 17, 1945 Referring Provider (PT): [name], MD   Encounter Date: 12/15/2018  PT End of Session - 12/15/18 0859    Visit Number  5    Date for PT Re-Evaluation  01/21/19    Authorization Type  [employer]    PT Start Time  0900    PT Stop Time  0940    PT Time Calculation (min)  40 min    Activity Tolerance  Patient tolerated treatment well    Behavior During Therapy  [facility] for tasks assessed/performed       Past Medical History:  Diagnosis Date  . Allergy   . Arthritis   . Asthma   . 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 Cataract    starting  . Diverticulitis   . Diverticulosis   . External hemorrhoids   . Hepatic cyst   . Hyperlipidemia   . Hypertension   . Pancreatic lesion 03/11/13    Past Surgical History:  Procedure Laterality Date  . ABDOMINAL HYSTERECTOMY    . COLONOSCOPY  01/25/2014   per Dr. [name], diverticulae only, repeat in 10 yrs   . LEFT HEART CATH AND CORONARY ANGIOGRAPHY N/A 01/27/2017   Procedure: LEFT HEART CATH AND CORONARY ANGIOGRAPHY;  Surgeon: Martinique, Peter M, MD;  Location: [place] [facility];  Service: Cardiovascular;  Laterality: N/A;  . SMALL INTESTINE SURGERY     from small intestine    There were no vitals filed for this visit.  Subjective Assessment - 12/15/18 0859    Subjective  I got a bad cramp in the Rt inner thigh trying to do my SLR exercise yesterday. Made me afraid to do more.  Took awhile for it to go away.    How long can you walk comfortably?  leg weakness with stairs and walking - 15 min walking, 14 steps    Diagnostic tests  MRI 11/2018 multi-level spondylosis, diffuse central and lateral stenosis, advanced facet hypertrophy    Patient Stated Goals  get  legs stronger, keep up with desired level of activity    Currently in Pain?  Yes    Pain Score  1     Pain Location  Back    Pain Orientation  Right;Left    Pain Descriptors / Indicators  Aching    Pain Type  Chronic pain    Pain Onset  More than a month ago    Pain Frequency  Intermittent    Aggravating Factors   tired as day progresses    Pain Relieving Factors  sit down    Effect of Pain on Daily Activities  stairs, prolonged walking                       [facility] Adult PT Treatment/Exercise - 12/15/18 0001      Exercises   Exercises  Lumbar;Knee/Hip      Lumbar Exercises: Stretches   Active Hamstring Stretch  Left;Right;3 reps;20 seconds    Active Hamstring Stretch Limitations  seated    Other Lumbar Stretch Exercise  seated trunk flexion ball rollouts green flexion and flex with SB x 10 each      Lumbar Exercises: Aerobic   Nustep  8 min, level 3 legs only,  seat 7, PT present to discuss HEP      Lumbar Exercises: Sidelying   Clam  Both;10 reps   2 sets   Clam Limitations  green band      Knee/Hip Exercises: Machines for Strengthening   Total Gym Leg Press  70# bil LEs x 30 reps, 30# single leg Rt/Lt x 15 each, PT cued heel press for glute activation      Knee/Hip Exercises: Standing   Forward Step Up  Both;1 set;10 reps;Hand Hold: 1   hand hold on Lt to simulate home set up   Forward Step Up Limitations  fatigue at 10 bil      Knee/Hip Exercises: Seated   Long Arc Quad  Strengthening;Both;10 reps    Long Arc Quad Limitations  GREEN band    Sit to [employer]  15 reps;with UE support      Knee/Hip Exercises: Sidelying   Hip ABduction  Strengthening;Right;Left;5 reps    Hip ABduction Limitations  quick to fatigue on Rt               PT Short Term Goals - 12/13/18 0944      PT SHORT TERM GOAL #3   Title  Pt will report at least 20% improvement in fatigue when climbing flight of stairs (14 steps) at home secondary to improving strength of bil LEs.     Baseline  in the AM better, not as day progresses    Status  On-going        PT Long Term Goals - 11/26/18 1200      PT LONG TERM GOAL #1   Title  Pt will be ind in advanced HEP and understand how to safely progress.    Time  8    Period  Weeks    Status  New    Target Date  01/21/19      PT LONG TERM GOAL #2   Title  Pt will be able to perform walking/standing activities for at least 30 min before reaching LE fatigue requiring a seated break.    Time  8    Period  Weeks    Status  New    Target Date  01/21/19      PT LONG TERM GOAL #3   Title  Pt will report at least 70% improvement in fatigue levels with climbing flight of stairs at home (14 steps).    Time  8    Period  Weeks    Status  New    Target Date  01/21/19      PT LONG TERM GOAL #4   Title  Pt will achieve at least 4+/5 in all LE muscle groups to improve tolerance of desired daily tasks and recreation activities including golf, gardening, yardwork.    Time  8    Period  Weeks    Status  New    Target Date  01/21/19      PT LONG TERM GOAL #5   Title  Pt will achieve bil hip ROM to [facility] to reduce strain on lumbar spine.    Time  8    Period  Weeks    Status  New    Target Date  01/21/19            Plan - 12/15/18 0941    Clinical Impression Statement  Pt continues to struggle with cramping during HEP.  PT adjusted HEP and added more strength and stretching.  Pt continues to tolerate  strengthening well with careful monitoring of cramping tendencies.  She did well with leg press and PT cued emphasis on heel press to engage gluteals bil.  PT advised Pt to work to fatigue on all strengthening aspects of HEP, rotate through strength options as two different sets alternating days, and to perform stretches daily.  She will continue to benefit from skilled PT along current POC for strength and functional training.    PT Frequency  2x / week    PT Duration  8 weeks    PT Treatment/Interventions  ADLs/Self Care  Home Management;Cryotherapy;Electrical Stimulation;Iontophoresis 4mg /ml Dexamethasone;Moist Heat;Traction;Gait training;Stair training;Functional mobility training;Therapeutic activities;Therapeutic exercise;Balance training;Neuromuscular re-education;Patient/family education;Manual techniques;Passive range of motion;Dry needling;Taping;Spinal Manipulations;Joint Manipulations    PT Next Visit Plan  NuStep, leg press, gluteal and abduction strength, f/u on updated HEP last visit (stretches, new strength)    PT Home Exercise Plan  Access Code: [email or web address]    Consulted and Agree with Plan of Care  Patient       Patient will benefit from skilled therapeutic intervention in order to improve the following deficits and impairments:     Visit Diagnosis: 1. Muscle weakness (generalized)   2. Other abnormalities of gait and mobility   3. Chronic bilateral low back pain without sciatica        Problem List Patient Active Problem List   Diagnosis Date Noted  . Palpitations 09/20/2018  . Chronic systolic CHF (congestive heart failure) ([place]) 01/27/2017  . Pleural effusion on right 12/03/2016  . CHF (congestive heart failure), NYHA class I, acute, systolic ([place]) 00/17/4944  . Acute CHF ([place]) 10/26/2016  . Plantar fasciitis, bilateral 03/17/2013  . ANXIETY STATE, UNSPECIFIED 03/27/2009  . Hyperlipidemia 05/04/2007  . Benign essential HTN 05/04/2007  . ALLERGIC RHINITIS 05/04/2007  . Asthma 05/04/2007  . Osteoarthritis 05/04/2007    Baruch Merl, PT 12/15/18 9:44 AM   [facility] [address], [place], [place], [address] Phone: [phone]   Fax:  [phone]  Name: WILMA MICHAELSON MRN: [id number] Date of Birth: 07/30/1945

## 2018-12-17 DIAGNOSIS — L814 Other melanin hyperpigmentation: Secondary | ICD-10-CM | POA: Diagnosis not present

## 2018-12-17 DIAGNOSIS — L578 Other skin changes due to chronic exposure to nonionizing radiation: Secondary | ICD-10-CM | POA: Diagnosis not present

## 2018-12-17 DIAGNOSIS — H02826 Cysts of left eye, unspecified eyelid: Secondary | ICD-10-CM | POA: Diagnosis not present

## 2018-12-17 DIAGNOSIS — Z808 Family history of malignant neoplasm of other organs or systems: Secondary | ICD-10-CM | POA: Diagnosis not present

## 2018-12-17 DIAGNOSIS — Z85828 Personal history of other malignant neoplasm of skin: Secondary | ICD-10-CM | POA: Diagnosis not present

## 2018-12-17 DIAGNOSIS — Z1283 Encounter for screening for malignant neoplasm of skin: Secondary | ICD-10-CM | POA: Diagnosis not present

## 2018-12-17 DIAGNOSIS — Z87891 Personal history of nicotine dependence: Secondary | ICD-10-CM | POA: Diagnosis not present

## 2018-12-17 DIAGNOSIS — D1801 Hemangioma of skin and subcutaneous tissue: Secondary | ICD-10-CM | POA: Diagnosis not present

## 2018-12-17 DIAGNOSIS — L738 Other specified follicular disorders: Secondary | ICD-10-CM | POA: Diagnosis not present

## 2018-12-17 DIAGNOSIS — L821 Other seborrheic keratosis: Secondary | ICD-10-CM | POA: Diagnosis not present

## 2018-12-17 DIAGNOSIS — L409 Psoriasis, unspecified: Secondary | ICD-10-CM | POA: Diagnosis not present

## 2018-12-17 DIAGNOSIS — D229 Melanocytic nevi, unspecified: Secondary | ICD-10-CM | POA: Diagnosis not present

## 2018-12-20 ENCOUNTER — Encounter: Payer: Self-pay | Admitting: Physical Therapy

## 2018-12-20 ENCOUNTER — Other Ambulatory Visit: Payer: Self-pay

## 2018-12-20 ENCOUNTER — Ambulatory Visit: Payer: PPO | Admitting: Physical Therapy

## 2018-12-20 DIAGNOSIS — G8929 Other chronic pain: Secondary | ICD-10-CM

## 2018-12-20 DIAGNOSIS — M545 Low back pain: Secondary | ICD-10-CM | POA: Diagnosis not present

## 2018-12-20 DIAGNOSIS — R2689 Other abnormalities of gait and mobility: Secondary | ICD-10-CM

## 2018-12-20 DIAGNOSIS — M6281 Muscle weakness (generalized): Secondary | ICD-10-CM

## 2018-12-20 NOTE — Therapy (Signed)
[facility] [address] [place], [place], [address] Phone: [phone]   Fax:  [phone]  Physical Therapy Treatment  Patient Details  Name: Tina May MRN: [id number] Date of Birth: 1945-06-24 Referring Provider (PT): [name], MD   Encounter Date: 12/20/2018  PT End of Session - 12/20/18 0930    Visit Number  6    Date for PT Re-Evaluation  01/21/19    Authorization Type  [employer]    PT Start Time  0931    PT Stop Time  1010    PT Time Calculation (min)  39 min    Activity Tolerance  Patient tolerated treatment well    Behavior During Therapy  [facility] for tasks assessed/performed       Past Medical History:  Diagnosis Date  . Allergy   . Arthritis   . Asthma   . 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 Cataract    starting  . Diverticulitis   . Diverticulosis   . External hemorrhoids   . Hepatic cyst   . Hyperlipidemia   . Hypertension   . Pancreatic lesion 03/11/13    Past Surgical History:  Procedure Laterality Date  . ABDOMINAL HYSTERECTOMY    . COLONOSCOPY  01/25/2014   per Dr. [name], diverticulae only, repeat in 10 yrs   . LEFT HEART CATH AND CORONARY ANGIOGRAPHY N/A 01/27/2017   Procedure: LEFT HEART CATH AND CORONARY ANGIOGRAPHY;  Surgeon: Martinique, Peter M, MD;  Location: [place] [facility];  Service: Cardiovascular;  Laterality: N/A;  . SMALL INTESTINE SURGERY     from small intestine    There were no vitals filed for this visit.  Subjective Assessment - 12/20/18 0936    Subjective  I feel about the same.  The exercises are going fine at home.  I still don't notice any difference in going up and down stairs.    Currently in Pain?  Yes    Pain Score  1     Pain Location  Back    Pain Orientation  Right;Left    Pain Descriptors / Indicators  Aching    Pain Type  Chronic pain    Pain Radiating Towards  across lower back    Pain Onset  More than a month ago    Pain  Frequency  Intermittent    Multiple Pain Sites  No                       [facility] Adult PT Treatment/Exercise - 12/20/18 0001      Lumbar Exercises: Aerobic   Nustep  8 min, level 3 legs only, seat 7, PT present to discuss HEP      Lumbar Exercises: Standing   Heel Raises  20 reps;1 second   10     Lumbar Exercises: Supine   Bent Knee Raise  20 reps   2# ankle weight     Knee/Hip Exercises: Stretches   Active Hamstring Stretch  Left;Right;5 reps;10 seconds    Active Hamstring Stretch Limitations  supine with quad set    Piriformis Stretch  Left;Right;30 seconds;1 rep      Knee/Hip Exercises: Machines for Strengthening   Total Gym Leg Press  80# bil LEs x 30 reps, 40# single leg Rt/Lt x 15 each, PT cued heel press for glute activation   seat 6     Knee/Hip Exercises: Standing   Hip Abduction  Stengthening;Both;2 sets;10 reps   2#   Hip Extension  Stengthening;Both;2 sets;10 reps   2#     Knee/Hip Exercises: Seated   Clamshell with TheraBand  Green    Sit to [place]  15 reps;with UE support      Knee/Hip Exercises: Supine   Straight Leg Raises  Strengthening;Right;Left;1 set;10 reps   1.5#              PT Short Term Goals - 12/13/18 0944      PT SHORT TERM GOAL #3   Title  Pt will report at least 20% improvement in fatigue when climbing flight of stairs (14 steps) at home secondary to improving strength of bil LEs.    Baseline  in the AM better, not as day progresses    Status  On-going        PT Long Term Goals - 11/26/18 1200      PT LONG TERM GOAL #1   Title  Pt will be ind in advanced HEP and understand how to safely progress.    Time  8    Period  Weeks    Status  New    Target Date  01/21/19      PT LONG TERM GOAL #2   Title  Pt will be able to perform walking/standing activities for at least 30 min before reaching LE fatigue requiring a seated break.    Time  8    Period  Weeks    Status  New    Target Date  01/21/19      PT  LONG TERM GOAL #3   Title  Pt will report at least 70% improvement in fatigue levels with climbing flight of stairs at home (14 steps).    Time  8    Period  Weeks    Status  New    Target Date  01/21/19      PT LONG TERM GOAL #4   Title  Pt will achieve at least 4+/5 in all LE muscle groups to improve tolerance of desired daily tasks and recreation activities including golf, gardening, yardwork.    Time  8    Period  Weeks    Status  New    Target Date  01/21/19      PT LONG TERM GOAL #5   Title  Pt will achieve bil hip ROM to [facility] to reduce strain on lumbar spine.    Time  8    Period  Weeks    Status  New    Target Date  01/21/19            Plan - 12/20/18 1008    Clinical Impression Statement  Pt did well and was able to add resistance to exercises.  Overall she continues to feel fatigued throughout her day.  She struggles with calf raises.  She needed strong tactile cues to stabilize pelvis during sidelying hip abduciton.  Pt will continue to benefit from skilled PT to progress strength for return to full functional activities.    PT Treatment/Interventions  ADLs/Self Care Home Management;Cryotherapy;Electrical Stimulation;Iontophoresis 4mg /ml Dexamethasone;Moist Heat;Traction;Gait training;Stair training;Functional mobility training;Therapeutic activities;Therapeutic exercise;Balance training;Neuromuscular re-education;Patient/family education;Manual techniques;Passive range of motion;Dry needling;Taping;Spinal Manipulations;Joint Manipulations    PT Next Visit Plan  NuStep, leg press, gluteal and abduction strength    PT Home Exercise Plan  Access Code: [email or web address]    Consulted and Agree with Plan of Care  Patient       Patient will benefit from  skilled therapeutic intervention in order to improve the following deficits and impairments:  Abnormal gait, Decreased range of motion, Difficulty walking, Decreased endurance, Decreased activity tolerance, Pain, Impaired flexibility,  Decreased balance, Hypomobility, Improper body mechanics, Decreased mobility, Decreased strength, Postural dysfunction  Visit Diagnosis: 1. Muscle weakness (generalized)   2. Other abnormalities of gait and mobility   3. Chronic bilateral low back pain without sciatica        Problem List Patient Active Problem List   Diagnosis Date Noted  . Palpitations 09/20/2018  . Chronic systolic CHF (congestive heart failure) ([place]) 01/27/2017  . Pleural effusion on right 12/03/2016  . CHF (congestive heart failure), NYHA class I, acute, systolic ([place]) 26/94/8546  . Acute CHF ([place]) 10/26/2016  . Plantar fasciitis, bilateral 03/17/2013  . ANXIETY STATE, UNSPECIFIED 03/27/2009  . Hyperlipidemia 05/04/2007  . Benign essential HTN 05/04/2007  . ALLERGIC RHINITIS 05/04/2007  . Asthma 05/04/2007  . Osteoarthritis 05/04/2007    Jule Ser, PT 12/20/2018, 10:24 AM  [facility] [address] [place], [place], [address] Phone: [phone]   Fax:  [phone]  Name: Tina May MRN: [id number] Date of Birth: [birth year]

## 2018-12-22 ENCOUNTER — Encounter: Payer: Self-pay | Admitting: Physical Therapy

## 2018-12-22 ENCOUNTER — Ambulatory Visit: Payer: PPO | Admitting: Physical Therapy

## 2018-12-22 ENCOUNTER — Other Ambulatory Visit: Payer: Self-pay

## 2018-12-22 DIAGNOSIS — G8929 Other chronic pain: Secondary | ICD-10-CM

## 2018-12-22 DIAGNOSIS — R2689 Other abnormalities of gait and mobility: Secondary | ICD-10-CM

## 2018-12-22 DIAGNOSIS — M6281 Muscle weakness (generalized): Secondary | ICD-10-CM

## 2018-12-22 DIAGNOSIS — M545 Low back pain: Secondary | ICD-10-CM | POA: Diagnosis not present

## 2018-12-22 NOTE — Therapy (Signed)
[facility] [address] [place], [place], [address] Phone: [phone]   Fax:  [phone]  Physical Therapy Treatment  Patient Details  Name: Tina May MRN: [id number] Date of Birth: [birth year] Referring Provider (PT): [name], MD   Encounter Date: 12/22/2018  PT End of Session - 12/22/18 0942    Visit Number  7    Date for PT Re-Evaluation  01/21/19    Authorization Type  [employer]    PT Start Time  0900    PT Stop Time  0942    PT Time Calculation (min)  42 min    Activity Tolerance  Patient tolerated treatment well    Behavior During Therapy  [facility] for tasks assessed/performed       Past Medical History:  Diagnosis Date  . Allergy   . Arthritis   . Asthma   . 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 Cataract    starting  . Diverticulitis   . Diverticulosis   . External hemorrhoids   . Hepatic cyst   . Hyperlipidemia   . Hypertension   . Pancreatic lesion 03/11/13    Past Surgical History:  Procedure Laterality Date  . ABDOMINAL HYSTERECTOMY    . COLONOSCOPY  01/25/2014   per Dr. [name], diverticulae only, repeat in 10 yrs   . LEFT HEART CATH AND CORONARY ANGIOGRAPHY N/A 01/27/2017   Procedure: LEFT HEART CATH AND CORONARY ANGIOGRAPHY;  Surgeon: Martinique, Peter M, MD;  Location: [place] [facility];  Service: Cardiovascular;  Laterality: N/A;  . SMALL INTESTINE SURGERY     from small intestine    There were no vitals filed for this visit.  Subjective Assessment - 12/22/18 0901    Subjective  Pt states things are going well. She still feels weak with going up the stairs.    Currently in Pain?  No/denies    Pain Onset  More than a month ago                       [facility] Adult PT Treatment/Exercise - 12/22/18 0001      Lumbar Exercises: Supine   Other Supine Lumbar Exercises  hooklying low trunk rotation x10 reps each direction      Lumbar Exercises: Sidelying   Other Sidelying Lumbar Exercises  thoracic open book stretch x12 reps Lt and Rt       Knee/Hip Exercises: Machines for Strengthening   Total Gym Leg Press  Seat #6: BLE #90 x15 reps, increased to 110# x20 reps; single leg #60 x20 reps each      Knee/Hip Exercises: Standing   Heel Raises  Both;1 set;10 reps    Knee Flexion  Strengthening;Both;2 sets;10 reps    Knee Flexion Limitations  3# ankle weights    Hip Abduction  Stengthening;1 set;10 reps    Abduction Limitations  yellow TB around feet     Hip Extension  Stengthening;Both;1 set;10 reps    Extension Limitations  yellow TB around ankles, step back motion     Forward Step Up  Both;1 set;10 reps    Other Standing Knee Exercises  BUE pressdown with blue TB 2x15 reps       Knee/Hip Exercises: Seated   Sit to Sand  1 set;15 reps   blue TB resistance            PT Education - 12/22/18 0942    Education Details  updated to HEP; technique with therex    Person(s) Educated  Patient    Methods  Explanation;Handout;Verbal cues    Comprehension  Returned demonstration;Verbalized understanding       PT Short Term Goals - 12/13/18 0944      PT SHORT TERM GOAL #3   Title  Pt will report at least 20% improvement in fatigue when climbing flight of stairs (14 steps) at home secondary to improving strength of bil LEs.    Baseline  in the AM better, not as day progresses    Status  On-going        PT Long Term Goals - 11/26/18 1200      PT LONG TERM GOAL #1   Title  Pt will be ind in advanced HEP and understand how to safely progress.    Time  8    Period  Weeks    Status  New    Target Date  01/21/19      PT LONG TERM GOAL #2   Title  Pt will be able to perform walking/standing activities for at least 30 min before reaching LE fatigue requiring a seated break.    Time  8    Period  Weeks    Status  New    Target Date  01/21/19      PT LONG TERM GOAL #3   Title  Pt will report at least 70% improvement in fatigue levels  with climbing flight of stairs at home (14 steps).    Time  8    Period  Weeks    Status  New    Target Date  01/21/19      PT LONG TERM GOAL #4   Title  Pt will achieve at least 4+/5 in all LE muscle groups to improve tolerance of desired daily tasks and recreation activities including golf, gardening, yardwork.    Time  8    Period  Weeks    Status  New    Target Date  01/21/19      PT LONG TERM GOAL #5   Title  Pt will achieve bil hip ROM to [facility] to reduce strain on lumbar spine.    Time  8    Period  Weeks    Status  New    Target Date  01/21/19            Plan - 12/22/18 0943    Clinical Impression Statement  Pt continues to feel that she has LE fatigue and weakness with stairs and other functional activity at home. Therapist reminded pt that strength improvements take time and that she is making adequate progressions in her therex from session to session. Pt was able to increase leg press resistance with bilateral and single leg resistance. There was no difficulty with this, but she did report fatigue with the last couple of repetitions. Pt's HEP was updated to reflect improvements in functional strength and she denied any increase in low back pain end of today's session.    PT Treatment/Interventions  ADLs/Self Care Home Management;Cryotherapy;Electrical Stimulation;Iontophoresis 4mg /ml Dexamethasone;Moist Heat;Traction;Gait training;Stair training;Functional mobility training;Therapeutic activities;Therapeutic exercise;Balance training;Neuromuscular re-education;Patient/family education;Manual techniques;Passive range of motion;Dry needling;Taping;Spinal Manipulations;Joint Manipulations    PT Next Visit Plan  NuStep for LE endurance, leg press continue to progress resistance, gluteal and abduction strength progression    PT Home Exercise Plan  Access Code: [email or web address]    Consulted and Agree with Plan of Care  Patient  Patient will benefit from skilled therapeutic  intervention in order to improve the following deficits and impairments:  Abnormal gait, Decreased range of motion, Difficulty walking, Decreased endurance, Decreased activity tolerance, Pain, Impaired flexibility, Decreased balance, Hypomobility, Improper body mechanics, Decreased mobility, Decreased strength, Postural dysfunction  Visit Diagnosis: 1. Muscle weakness (generalized)   2. Other abnormalities of gait and mobility   3. Chronic bilateral low back pain without sciatica        Problem List Patient Active Problem List   Diagnosis Date Noted  . Palpitations 09/20/2018  . Chronic systolic CHF (congestive heart failure) ([place]) 01/27/2017  . Pleural effusion on right 12/03/2016  . CHF (congestive heart failure), NYHA class I, acute, systolic ([place]) 83/01/4075  . Acute CHF ([place]) 10/26/2016  . Plantar fasciitis, bilateral 03/17/2013  . ANXIETY STATE, UNSPECIFIED 03/27/2009  . Hyperlipidemia 05/04/2007  . Benign essential HTN 05/04/2007  . ALLERGIC RHINITIS 05/04/2007  . Asthma 05/04/2007  . Osteoarthritis 05/04/2007   9:54 AM,12/22/18 Sherol Dade PT, DPT [facility] [address] [place], [place], [address] Phone: [phone]   Fax:  [phone]  Name: Tina May MRN: [id number] Date of Birth: Oct 17, 1945

## 2018-12-22 NOTE — Patient Instructions (Signed)
Access Code: [email or web address]  URL: [email or web address]  Date: 12/22/2018  Prepared by: Sherol Dade   Exercises  Supine Posterior Pelvic Tilt - 10 reps - 3 sets - 3 hold - 1x daily - 7x weekly  Supine Double Knee to Chest - 2 reps - 1 sets - 15 sec hold - 1x daily - 7x weekly  Supine Piriformis Stretch with Leg Straight - 2 reps - 1 sets - 30 hold - 1x daily - 7x weekly  Heel rises with counter support - 15-20 reps - 3 sets - 1x daily - 7x weekly  Standing Hip Hiking - 10 reps - 1 sets - 5 hold - 1x daily - 7x weekly  Seated Hamstring Stretch - 2 reps - 1 sets - 30 hold - 1x daily - 7x weekly  Sidelying Thoracic Lumbar Rotation - 10 reps - 2x daily - 7x weekly  Supine Lower Trunk Rotation - 10 reps - 3 sets - 1x daily - 7x weekly  Runner's Climb - 10 reps - 1x daily - 7x weekly  Adductor Stretch on Chair - 2 reps - 2 sets - 30 hold - 1x daily - 7x weekly  Sidelying Hip Abduction - 15 reps - 3 sets - 1x daily - 7x weekly  Sit to Stand without Arm Support - 15 reps - 2 sets - 1x daily - 7x weekly    [facility] [address] [place], [place] [address] Phone # [phone] Fax [phone]

## 2018-12-23 ENCOUNTER — Other Ambulatory Visit: Payer: Self-pay | Admitting: Obstetrics and Gynecology

## 2018-12-24 ENCOUNTER — Ambulatory Visit: Payer: PPO | Admitting: Gastroenterology

## 2018-12-24 ENCOUNTER — Encounter: Payer: Self-pay | Admitting: Gastroenterology

## 2018-12-24 VITALS — BP 112/70 | HR 79 | Temp 98.1°F | Ht 62.0 in | Wt 167.0 lb

## 2018-12-24 DIAGNOSIS — K869 Disease of pancreas, unspecified: Secondary | ICD-10-CM | POA: Diagnosis not present

## 2018-12-24 DIAGNOSIS — Z9049 Acquired absence of other specified parts of digestive tract: Secondary | ICD-10-CM

## 2018-12-24 DIAGNOSIS — Z1211 Encounter for screening for malignant neoplasm of colon: Secondary | ICD-10-CM

## 2018-12-24 DIAGNOSIS — R140 Abdominal distension (gaseous): Secondary | ICD-10-CM | POA: Diagnosis not present

## 2018-12-24 NOTE — Patient Instructions (Signed)
If you are age 73 or older, your body mass index should be between 23-30. Your Body mass index is 30.54 kg/m. If this is out of the aforementioned range listed, please consider follow up with your Primary Care Provider.  If you are age 33 or younger, your body mass index should be between 19-25. Your Body mass index is 30.54 kg/m. If this is out of the aformentioned range listed, please consider follow up with your Primary Care Provider.    Continue using fiber, could consider fibercon once daily.   If gas/bloating becomes worse then consider: GasX( simethicone 150mg  2-4 times daily as needed.   Thank you for choosing me and [facility].  Dr. Rush Landmark

## 2018-12-24 NOTE — Progress Notes (Signed)
[place] VISIT   Primary Care Provider [name], MD [place] [address] [phone]  Referring Provider [name], [place],  [place] [address] [phone]  Patient Profile: Tina May is a 73 y.o. 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  The patient presents to the [facility] for an evaluation and management of problem(s) noted below:  Problem List 1. Abdominal distention   2. Bloating   3. Pancreatic lesion   4. Colon cancer screening   5. Hx of resection of small bowel     History of Present Illness This is the patient's first visit to the outpatient [place] clinic in years.  She was previously followed by Dr. Ernest Mallick before her retirement.  Last visit was in 2015 for a colonoscopy and was told to have a 10-year follow-up.  Today, the patient states she is doing well.  However approximately 1.5 years ago she was dealing with a persistent chronic sinus infection that required multiple doses of antibiotics and eventually after completing her antibiotic therapy she had for approximately 3 to 4 months abdominal issues including distention, bloating, pain and changes in bowel habits for slight period in time.  She have increased abdominal grumbling as well.  This resolved after a few months and she did well until approximately July.  She started to have increased gas eructation as well as flatus but mostly flatus.  No indigestion or GERD symptoms.  She continued to have normal bowel movements on a daily basis in the morning.  Grumbling of her abdomen persisted.  She began taking a probiotic.  She takes fiber supplementation on a daily basis as a nonsugar fiber supplement.  Her symptoms  have now improved significantly from where they were in July she is not sure she will need much more at this point in time.  She has no nausea or vomiting.  She has no pain currently.  GI Review of Systems Positive as above Negative for pyrosis, dysphagia, odynophagia, pain, change in bowel habits, melena, hematochezia  Review of Systems General: Denies fevers/chills/weight loss HEENT: Denies oral lesions Cardiovascular: Denies chest pain Pulmonary: Denies shortness of breath/nocturnal cough Gastroenterological: See HPI Genitourinary: Denies darkened urine Hematological: Denies easy bruising/bleeding Endocrine: Denies temperature intolerance Dermatological: Denies jaundice Psychological: Mood is stable   Medications Current Outpatient Medications  Medication Sig Dispense Refill   aspirin EC 81 MG tablet Take 81 mg by mouth daily.     bisoprolol (ZEBETA) 5 MG tablet Take 1 tablet (5 mg total) by mouth daily. 30 tablet 11   ENTRESTO 97-103 MG TAKE 1 TABLET BY MOUTH TWICE A DAY 60 tablet 6   estradiol (ESTRACE) 0.5 MG tablet Take 0.5 mg by mouth once a week. For Menopausal hot flashes     FIBER SELECT GUMMIES CHEW Chew 2 tablets by mouth daily.     Hyaluronic Acid-Vitamin C (HYALURONIC ACID PO) Take 1 tablet by mouth daily. Women's Hyaluronic Acid     meloxicam (MOBIC) 15 MG tablet Take 1 tablet (15 mg total) by mouth daily. 90 tablet 3   Misc Natural Products (TART CHERRY ADVANCED PO) Take 60 mLs by mouth at bedtime.     Multiple Vitamin (MULTIVITAMIN WITH MINERALS) TABS tablet Take 1 tablet by mouth daily. One-A-Day     PROAIR HFA 108 (  90 Base) MCG/ACT inhaler INHALE 2 PUFFS INTO THE LUNGS EVERY 4 (FOUR) HOURS AS NEEDED FOR WHEEZING OR SHORTNESS OF BREATH. 25.5 Inhaler 3   RESTASIS 0.05 % ophthalmic emulsion Place 1 drop into both eyes 2 (two) times daily.      rosuvastatin (CRESTOR) 5 MG tablet TAKE 1 TABLET (5 MG TOTAL) BY MOUTH DAILY. 90 tablet 3   spironolactone  (ALDACTONE) 25 MG tablet Take 1 tablet (25 mg total) by mouth daily. 90 tablet 3   TURMERIC CURCUMIN PO Take 1,000 mg by mouth daily.     No current facility-administered medications for this visit.     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 [name], diverticulae only, repeat in 10 yrs    LEFT HEART CATH AND CORONARY ANGIOGRAPHY N/A 01/27/2017   Procedure: LEFT HEART CATH AND CORONARY ANGIOGRAPHY;  Surgeon: Martinique, Peter M, MD;  Location: [place] [facility];  Service: Cardiovascular;  Laterality: N/A;   SMALL INTESTINE SURGERY     from small intestine   Social History   Socioeconomic History   Marital status: Married    Spouse name: Not on file   Number of children: 3   Years of education: Not on file   Highest education level: Not on file  Occupational History   Occupation: Retired  Scientist, product/process development strain: Not on file   Food insecurity    Worry: Not on file    Inability: Not on Lexicographer needs    Medical: Not on file    Non-medical: Not on file  Tobacco Use   Smoking status: Former Smoker    Packs/day: 1.00    Years: 20.00    Pack years: 20.00    Types: Cigarettes    Quit date: 05/06/1991    Years since quitting: 27.6   Smokeless tobacco: Never Used  Substance and Sexual Activity   Alcohol use: Yes    Alcohol/week:  7.0 standard drinks    Types: 7 Glasses of wine per week    Comment: occ   Drug use: No   Sexual activity: Yes    Birth control/protection: Surgical, Post-menopausal    Comment: partial hysterectomy  Lifestyle   Physical activity    Days per week: Not on file    Minutes per session: Not on file   Stress: Not on file  Relationships   Social connections    Talks on phone: Not on file    Gets together: Not on file    Attends religious service: Not on file    Active member of club or organization: Not on file    Attends meetings of clubs or organizations: Not on file    Relationship status: Not on file  Intimate partner violence    Fear of current or ex partner: Not on file    Emotionally abused: Not on file    Physically abused: Not on file    Forced sexual activity: Not on file  Other Topics Concern   Not on file  Social History Narrative   Not on file   Family History  Problem Relation Age of Onset   Hyperlipidemia Other    Hypertension Other    Prostate cancer Other    Healthy Sister    Healthy Brother    Colon cancer Neg Hx    Esophageal cancer Neg Hx    Inflammatory bowel disease Neg Hx    Liver disease Neg Hx    Pancreatic cancer Neg Hx    Rectal cancer Neg Hx    Stomach cancer Neg Hx    I have reviewed her medical, social, and family history in detail and updated the electronic medical record as necessary.    PHYSICAL EXAMINATION  BP 112/70 (BP Location: Left Arm)    Pulse 79    Temp 98.1 F (36.7 C)    Ht 5\' 2"  (1.575 m)    Wt 167 lb (75.8 kg)    SpO2 98%    BMI 30.54 kg/m  Wt Readings from Last 3 Encounters:  12/24/18 167 lb (75.8 kg)  11/12/18 170 lb 12.8 oz (77.5 kg)  10/27/18 170 lb (77.1 kg)  GEN: NAD, appears stated age, doesn't appear chronically ill PSYCH: Cooperative, without pressured speech EYE: Conjunctivae pink, sclerae anicteric ENT: MMM, without oral ulcers, no erythema or exudates noted NECK: Supple CV: RR without  R/Gs  RESP: CTAB posteriorly, without wheezing GI: NABS, soft, midline surgical scar vertically that is well-healed (from prior small bowel resection), NT/ND, without rebound or guarding, no HSM appreciated MSK/EXT: No lower extremity edema SKIN: No jaundice NEURO:  Alert & Oriented x 3, no focal deficits   REVIEW OF DATA  I reviewed the following data at the time of this encounter:  GI Procedures and Studies  2015 colonoscopy ENDOSCOPIC IMPRESSION: There was moderate diverticulosis noted in the sigmoid colon complete resolution of acute colitis  Laboratory Studies  Reviewed in epic  Imaging Studies  June 2020 CT abdomen pelvis with contrast IMPRESSION: 1. No acute findings or explanation for the patient's symptoms. There is no bowel wall thickening or bowel obstruction. The small bowel anastomosis appears stable. 2. Slow growth of right adnexal lesion from 2014, suggesting a low-grade cystic ovarian neoplasm. Stable mild enlargement of the left ovary. Gynecology referral recommended. Ultrasound may be helpful for further characterization. 3. Stable cystic lesion in the pancreatic tail consistent with a benign finding. 4. Hepatic and renal cysts. 5.  Aortic Atherosclerosis (ICD10-I70.0).  August 2018 CT abdomen IMPRESSION: 1. 14 mm well-defined homogeneous cystic lesion in the tail of pancreas has increased minimally from 9 mm on the study of nearly 4 years ago. This is most likely a benign finding. In a patient of this age having a cystic lesion of this size, consensus guidelines suggests imaging every 2 years unless there is demonstrated growth and then 1 year follow-up is recommended. Since there has been minimal growth in the 4 year interval and to establish a new baseline, follow-up CT in 12 months is recommended. This recommendation follows ACR consensus guidelines: Management of Incidental Pancreatic Cysts: A White Paper of the ACR Incidental Findings Committee.  J Am Coll Radiol B4951161. 2.  Aortic Atherosclerois (ICD10-170.0) 3. Hepatic and right renal cysts.  2014 CTAP IMPRESSION: 1. Apparent mild colonic wall thickening along the ascending, transverse and proximal descending colon. This could reflect a mild infectious process, or possibly relative decompression. 2. Scattered diverticulosis along the proximal sigmoid colon, without evidence of diverticulitis. 3. 1.3 cm hypodense lesion noted near the tail of the pancreas; this could reflect a pancreatic cyst, though malignancy could have a similar appearance. MRCP is recommended for further evaluation, when and as deemed clinically appropriate. 4. Small hepatic cysts and right renal cyst noted. 5. Scattered calcification along the abdominal aorta and its branches. 6. Bilateral ovarian prominence, likely at the upper limits of normal; no suspicious mass seen.   ASSESSMENT  Ms. Sabic is a 73 y.o. 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  The patient is seen today for evaluation and management of:  1. Abdominal distention   2. Bloating   3. Pancreatic lesion   4. Colon cancer screening   5. Hx of resection of small bowel    Overall, the patient is clinically and hemodynamically stable.  Her symptoms have slowly improved over the course of the last few weeks.  I suspect that she does have some increased risk for the possibility of bacterial overgrowth in the setting of her prior small bowel resection for hemangioma.  However, she is doing well on probiotics and her symptoms are improving.  I have offered her the use of simethicone to try and aid with gas bloating if this continues to be an issue.  If her symptoms worsen significantly we will consider the rule out of bacterial overgrowth and  exocrine pancreatic insufficiency via testing.  For now we will monitor.  Her upcoming postsurgery for ovarian work-up is moving forward and although I do not think this is a reason for her symptoms be interesting to see how she does that she likely will get periprocedural antibiotics.  We know that any changes to the microbiology in regards to antibiotics can lead to weeks or months of changes prior to stabilization of prior microbiology.  I think continuing her probiotics that she is doing is a good idea however we know that the data for probiotics is very low in this particular setting.  After the clinic visit was completed I further reviewed the patient's previous cross-sectional imaging and had seen that a pancreas cystic lesion has been noted in the pancreas tail going back to 2014.  This is never been fully characterized with an MRI/MRCP, so the possibility of an IPMN or MCN needs to be considered.  I will send a MyChart message to the patient as well so that she is aware of my desire of a MRI/MRCP being performed within 6 months to further characterize the findings and then decide on surveillance needs via imaging versus endoscopic ultrasound needs.  All patient questions were answered, to the best of my ability, and the patient agrees to the aforementioned plan of action with follow-up as indicated.   PLAN  Continue probiotics daily Continue fiber supplementation daily (consider transitioning from sugar-free fiber to FiberCon based on symptoms) If gas/bloating persists or worsens consider the use of Gas-X 150 mg 2-4 times daily If symptoms persist will want to rule out small intestine bacterial overgrowth via breath testing and EPI via fecal elastase Plan for MRI/MRCP in 6 months from last imaging study to further characterize pancreatic cystic lesion Colon cancer screening  in 2025   No orders of the defined types were placed in this encounter.   New Prescriptions   No medications on file    Modified Medications   No medications on file    Planned Follow Up Return in about 6 months (around 06/26/2019).   Justice Britain, MD [facility] Office # [phone]

## 2018-12-26 DIAGNOSIS — R140 Abdominal distension (gaseous): Secondary | ICD-10-CM | POA: Insufficient documentation

## 2018-12-26 DIAGNOSIS — Z9049 Acquired absence of other specified parts of digestive tract: Secondary | ICD-10-CM | POA: Insufficient documentation

## 2018-12-26 DIAGNOSIS — K869 Disease of pancreas, unspecified: Secondary | ICD-10-CM | POA: Insufficient documentation

## 2018-12-26 DIAGNOSIS — Z1211 Encounter for screening for malignant neoplasm of colon: Secondary | ICD-10-CM | POA: Insufficient documentation

## 2018-12-27 ENCOUNTER — Encounter: Payer: PPO | Admitting: Physical Therapy

## 2018-12-29 ENCOUNTER — Encounter: Payer: PPO | Admitting: Physical Therapy

## 2018-12-30 ENCOUNTER — Other Ambulatory Visit: Payer: Self-pay

## 2018-12-30 ENCOUNTER — Ambulatory Visit (INDEPENDENT_AMBULATORY_CARE_PROVIDER_SITE_OTHER): Payer: PPO | Admitting: Pharmacist

## 2018-12-30 VITALS — BP 120/70 | HR 68 | Wt 167.0 lb

## 2018-12-30 DIAGNOSIS — I10 Essential (primary) hypertension: Secondary | ICD-10-CM | POA: Diagnosis not present

## 2018-12-30 DIAGNOSIS — I5022 Chronic systolic (congestive) heart failure: Secondary | ICD-10-CM | POA: Diagnosis not present

## 2018-12-30 NOTE — Patient Instructions (Signed)
It was great to meet you today  Your blood pressure is excellent today and at goal <130/36mmHg  Continue taking your current medications  Call clinic with any concerns

## 2018-12-30 NOTE — Progress Notes (Signed)
Patient ID: Tina May                 DOB: [birth year]                      MRN: [id number]     HPI: Tina May is a 73 y.o. female referred by [name] to HTN clinic. PMH is significant for chronic systolic CHF, mild RCA stenosis, HTN, and HLD. At last visit in July, pt reported GI upset with carvedilol and was started on Toprol. Pt called clinic 2 weeks later with reports of elevated BP and HR. She was started on spironolactone 25mg  daily. She called back a few days later with reports of BP remaining elevated. Toprol was increased to 100mg  daily. She reported continued GI upset on metoprolol however it was not as bad as on carvedilol. She was then changed to bisoprolol 5mg  daily and presents today for follow up.  Pt presents today in good spirits. She is feeling the best she has in a while. Feels great on bisoprolol compared to carvedilol and metoprolol. Denies dizziness, falls, and LEE. She stays very active with yard work, golfing, and using the elliptical. Diet is excellent. She monitors her BP every day - BP < 120/70 and HR in the 60s. Does report some cramping, she stays hydrated with water. Does have spinal stenosis, is doing PT for this.  Pt saw GI on 12/24/18, she was advised to continue with probiotics and fiber supplementation and use Gas-X prn. BP was well controlled at 112/70 and HR was 79 at the time. GI symptoms much improved on bisoprolol.  BMET was checked 2 days after starting spironolactone, has not been checked since.  Current HF meds: bisoprolol 5mg  daily, Entresto 97-103mg  BID, spironolactone 25mg  daily Previously tried: amlodipine (upset stomach, diarrhea), carvedilol (GI upset) BP goal: <130/75mmHg  Family History: The patient's family history includes Healthy in her brother and sister; Hyperlipidemia in an other family member; Hypertension in an other family member; Prostate cancer in an other family member. There is no history of Colon cancer.  Social History:  Former smoker 1 PPD for 20 years, quit in 1993. Drinks 7 glasses of wine per week, denies illicit drug use.   Diet: Started the [employer] - eats a lot of salads, fish, vegetables, meat. Does not add salt to her food. No caffeine.  Exercise: Stays active with yard work, golf 3x a week, and elliptical.  Home BP readings: 1120/60s before meds, 100/60s after meds, HR 60s  Wt Readings from Last 3 Encounters:  12/24/18 167 lb (75.8 kg)  11/12/18 170 lb 12.8 oz (77.5 kg)  10/27/18 170 lb (77.1 kg)   BP Readings from Last 3 Encounters:  12/24/18 112/70  11/12/18 122/84  10/27/18 (!) 152/54   Pulse Readings from Last 3 Encounters:  12/24/18 79  11/12/18 71  10/27/18 77    Renal function: CrCl cannot be calculated (Patient's most recent lab result is older than the maximum 21 days allowed.).  Past Medical History:  Diagnosis Date  . Allergy   . Arthritis   . Asthma   . 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 Cataract    starting  . Diverticulitis   . Diverticulosis   . External hemorrhoids   . Hepatic cyst   . Hyperlipidemia   . Hypertension   . Pancreatic lesion 03/11/13    Current Outpatient Medications on File Prior to Visit  Medication  Sig Dispense Refill  . aspirin EC 81 MG tablet Take 81 mg by mouth daily.    . bisoprolol (ZEBETA) 5 MG tablet Take 1 tablet (5 mg total) by mouth daily. 30 tablet 11  . ENTRESTO 97-103 MG TAKE 1 TABLET BY MOUTH TWICE A DAY 60 tablet 6  . estradiol (ESTRACE) 0.5 MG tablet Take 0.5 mg by mouth once a week. For Menopausal hot flashes    . FIBER SELECT GUMMIES CHEW Chew 2 tablets by mouth daily.    [name] Hyaluronic Acid-Vitamin C (HYALURONIC ACID PO) Take 1 tablet by mouth daily. Women's Hyaluronic Acid    . meloxicam (MOBIC) 15 MG tablet Take 1 tablet (15 mg total) by mouth daily. 90 tablet 3  . Misc Natural Products (TART CHERRY ADVANCED PO) Take 60 mLs by mouth at bedtime.    . Multiple Vitamin (MULTIVITAMIN WITH MINERALS) TABS tablet Take 1  tablet by mouth daily. One-A-Day    . PROAIR HFA 108 (90 Base) MCG/ACT inhaler INHALE 2 PUFFS INTO THE LUNGS EVERY 4 (FOUR) HOURS AS NEEDED FOR WHEEZING OR SHORTNESS OF BREATH. 25.5 Inhaler 3  . RESTASIS 0.05 % ophthalmic emulsion Place 1 drop into both eyes 2 (two) times daily.     . rosuvastatin (CRESTOR) 5 MG tablet TAKE 1 TABLET (5 MG TOTAL) BY MOUTH DAILY. 90 tablet 3  . spironolactone (ALDACTONE) 25 MG tablet Take 1 tablet (25 mg total) by mouth daily. 90 tablet 3  . TURMERIC CURCUMIN PO Take 1,000 mg by mouth daily.     No current facility-administered medications on file prior to visit.     Allergies  Allergen Reactions  . Amlodipine   . Atorvastatin Other (See Comments)     muscle cramps  muscle cramps REACTION: muscle cramps  . Diclofenac Nausea And Vomiting  . Morphine And Related Nausea And Vomiting     Assessment/Plan:  1. HFrEF - BP at goal < 130/57mmHg at home and in clinic, HR in the 60s. Will continue bisoprolol 5mg  daily, Entresto 97-103mg  BID, and spironolactone 25mg  daily. Checking BMET today. Encouraged pt to continue with healthy diet and exercise. F/u in pharmacy clinic as needed since HF medications are optimized and pt is tolerating therapy well.  Tina May, PharmD, BCACP, [place] [id number] [place], [place] [address] Phone: [phone]; Fax: [phone] 12/30/2018 1:46 PM

## 2018-12-31 LAB — BASIC METABOLIC PANEL
BUN/Creatinine Ratio: 33 — ABNORMAL HIGH (ref 12–28)
BUN: 29 mg/dL — ABNORMAL HIGH (ref 8–27)
CO2: 24 mmol/L (ref 20–29)
Calcium: 10 mg/dL (ref 8.7–10.3)
Chloride: 103 mmol/L (ref 96–106)
Creatinine, Ser: 0.87 mg/dL (ref 0.57–1.00)
GFR calc Af Amer: 76 mL/min/{1.73_m2} (ref >59)
GFR calc non Af Amer: 66 mL/min/{1.73_m2} (ref >59)
Glucose: 105 mg/dL — ABNORMAL HIGH (ref 65–99)
Potassium: 5 mmol/L (ref 3.5–5.2)
Sodium: 142 mmol/L (ref 134–144)

## 2019-01-03 ENCOUNTER — Encounter: Payer: Self-pay | Admitting: Physical Therapy

## 2019-01-03 ENCOUNTER — Other Ambulatory Visit: Payer: Self-pay

## 2019-01-03 ENCOUNTER — Ambulatory Visit: Payer: PPO | Admitting: Physical Therapy

## 2019-01-03 DIAGNOSIS — G8929 Other chronic pain: Secondary | ICD-10-CM

## 2019-01-03 DIAGNOSIS — M545 Low back pain: Secondary | ICD-10-CM | POA: Diagnosis not present

## 2019-01-03 DIAGNOSIS — R2689 Other abnormalities of gait and mobility: Secondary | ICD-10-CM

## 2019-01-03 DIAGNOSIS — M6281 Muscle weakness (generalized): Secondary | ICD-10-CM

## 2019-01-03 NOTE — Therapy (Signed)
[facility] [address] [place], [place], [address] Phone: [phone]   Fax:  [phone]  Physical Therapy Treatment  Patient Details  Name: Tina May MRN: [id number] Date of Birth: 01/09/1946 Referring Provider (PT): [name], MD   Encounter Date: 01/03/2019  PT End of Session - 01/03/19 0854    Visit Number  8    Date for PT Re-Evaluation  01/21/19    Authorization Type  [employer]    PT Start Time  0855    PT Stop Time  0934    PT Time Calculation (min)  39 min    Activity Tolerance  Patient tolerated treatment well    Behavior During Therapy  [facility] for tasks assessed/performed       Past Medical History:  Diagnosis Date  . Allergy   . Arthritis   . Asthma   . 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 Cataract    starting  . Diverticulitis   . Diverticulosis   . External hemorrhoids   . Hepatic cyst   . Hyperlipidemia   . Hypertension   . Pancreatic lesion 03/11/13    Past Surgical History:  Procedure Laterality Date  . ABDOMINAL HYSTERECTOMY    . COLONOSCOPY  01/25/2014   per Dr. [name], diverticulae only, repeat in 10 yrs   . LEFT HEART CATH AND CORONARY ANGIOGRAPHY N/A 01/27/2017   Procedure: LEFT HEART CATH AND CORONARY ANGIOGRAPHY;  Surgeon: Martinique, Peter M, MD;  Location: [place] [facility];  Service: Cardiovascular;  Laterality: N/A;  . SMALL INTESTINE SURGERY     from small intestine    There were no vitals filed for this visit.  Subjective Assessment - 01/03/19 0858    Subjective  I played golf 4 days last week at the [place] with my friends.  It was great.  As long as I'm moving I can stay standing and active x 4 hours.  Static standing and stairs still give me a lot of fatigue. I did my HEP every day last week.    How long can you stand comfortably?  5 min    How long can you walk comfortably?  leg weakness with stairs and walking - 15 min walking, 14 steps     Diagnostic tests  MRI 11/2018 multi-level spondylosis, diffuse central and lateral stenosis, advanced facet hypertrophy    Patient Stated Goals  get legs stronger, keep up with desired level of activity    Currently in Pain?  No/denies    Aggravating Factors   tired as day progresses    Pain Relieving Factors  rest, sitting, staying active if on my feet vs static standing                       [facility] Adult PT Treatment/Exercise - 01/03/19 0001      Self-Care   Self-Care  Posture    Posture  standing posture, avoid lumbar ext/sway back, use TrA      Lumbar Exercises: Stretches   Active Hamstring Stretch  Left;Right;30 seconds    Active Hamstring Stretch Limitations  supine    Double Knee to Chest Stretch  5 reps;10 seconds    Lower Trunk Rotation  5 reps;10 seconds    Pelvic Tilt  20 reps    Piriformis Stretch  Left;Right;2 reps;30 seconds    Piriformis Stretch Limitations  supine    Other Lumbar Stretch Exercise  prayer,  prayer with SB      Lumbar Exercises: Aerobic   Nustep  seat 6 level 3 x 8' legs only, PT present to review goals and progress      Lumbar Exercises: Standing   Heel Raises  20 reps   2x10, very fatiguing     Knee/Hip Exercises: Standing   Forward Step Up  Both;15 reps;Hand Hold: 1;Step Height: 6"    Forward Step Up Limitations  very fagtiguing    Other Standing Knee Exercises  3# ankle weights ham curls modified plank on elevated mat table, hip ext no weights same positiion 3x5 each bil      Ankle Exercises: Machines for Strengthening   Cybex Leg Press  90# bil x 10, 110# x 20, PT cued push through heels for glute activation             PT Education - 01/03/19 0935    Education Details  Access Code: [email or web address]    Person(s) Educated  Patient    Methods  Explanation;Demonstration;Handout;Verbal cues    Comprehension  Verbalized understanding;Returned demonstration       PT Short Term Goals - 12/13/18 0944      PT SHORT TERM GOAL #3    Title  Pt will report at least 20% improvement in fatigue when climbing flight of stairs (14 steps) at home secondary to improving strength of bil LEs.    Baseline  in the AM better, not as day progresses    Status  On-going        PT Long Term Goals - 01/03/19 0854      PT LONG TERM GOAL #1   Title  Pt will be ind in advanced HEP and understand how to safely progress.    Status  On-going      PT LONG TERM GOAL #2   Title  Pt will be able to perform walking/standing activities for at least 30 min before reaching LE fatigue requiring a seated break.    Baseline  played golf for 4 hours x 4 times last week, static standing is more fatiguing    Status  Achieved      PT LONG TERM GOAL #3   Title  Pt will report at least 70% improvement in fatigue levels with climbing flight of stairs at home (14 steps).    Baseline  no progress at this time    Status  On-going      PT LONG TERM GOAL #4   Title  Pt will achieve at least 4+/5 in all LE muscle groups to improve tolerance of desired daily tasks and recreation activities including golf, gardening, yardwork.    Baseline  yes for golf, no for stair climbing    Status  On-going      PT LONG TERM GOAL #5   Title  Pt will achieve bil hip ROM to [facility] to reduce strain on lumbar spine.    Status  Achieved            Plan - 01/03/19 0900    Clinical Impression Statement  Pt continues to be compliant with HEP.  She reports she was able to golf x 4 hours x 4 days last week.  She continues to be limited in static standing x 5 min doing better when she is moving more.  PT went over standing posture to avoid lumbar locking of joints into extension today with use of TrA in standing.  Stairs continue to feel very fatiguing at home  without improvement noted at this time, although Pt able to complete 15 today vs 10 before fatigue.  Pt has difficulty activation gluteals Rt>Lt.  PT updated HEP for more lumbar decompression positions (prayer, prayer with  SB) and added propped hip extension leg straight to HEP for more targeted glute strength.  Continue current POC for strength, postural re-ed, stabilization.    Personal Factors and Comorbidities  Time since onset of injury/illness/exacerbation    Examination-Activity Limitations  Locomotion Level;Stairs    PT Frequency  2x / week    PT Duration  8 weeks    PT Treatment/Interventions  ADLs/Self Care Home Management;Cryotherapy;Electrical Stimulation;Iontophoresis 4mg /ml Dexamethasone;Moist Heat;Traction;Gait training;Stair training;Functional mobility training;Therapeutic activities;Therapeutic exercise;Balance training;Neuromuscular re-education;Patient/family education;Manual techniques;Passive range of motion;Dry needling;Taping;Spinal Manipulations;Joint Manipulations    PT Next Visit Plan  NuStep for LE endurance, leg press continue to progress resistance, gluteal and abduction strength progression    PT Home Exercise Plan  Access Code: [email or web address]    Consulted and Agree with Plan of Care  Patient       Patient will benefit from skilled therapeutic intervention in order to improve the following deficits and impairments:     Visit Diagnosis: Muscle weakness (generalized)  Other abnormalities of gait and mobility  Chronic bilateral low back pain without sciatica     Problem List Patient Active Problem List   Diagnosis Date Noted  . Abdominal distention 12/26/2018  . Bloating 12/26/2018  . Pancreatic lesion 12/26/2018  . Colon cancer screening 12/26/2018  . Hx of resection of small bowel 12/26/2018  . Palpitations 09/20/2018  . Chronic systolic CHF (congestive heart failure) ([place]) 01/27/2017  . Pleural effusion on right 12/03/2016  . CHF (congestive heart failure), NYHA class I, acute, systolic ([place]) 99991111  . Acute CHF ([place]) 10/26/2016  . Plantar fasciitis, bilateral 03/17/2013  . ANXIETY STATE, UNSPECIFIED 03/27/2009  . Hyperlipidemia 05/04/2007  . Benign essential HTN  05/04/2007  . ALLERGIC RHINITIS 05/04/2007  . Asthma 05/04/2007  . Osteoarthritis 05/04/2007    Baruch Merl, PT 01/03/19 9:39 AM   [facility] [address] [place], [place], [address] Phone: [phone]   Fax:  [phone]  Name: DALIDA WLODARSKI MRN: [id number] Date of Birth: [birth year]

## 2019-01-03 NOTE — Patient Instructions (Signed)
Access Code: [email or web address]  URL: [email or web address]  Date: 01/03/2019  Prepared by: Venetia Night Dinh Ayotte   Exercises  Supine Posterior Pelvic Tilt - 10 reps - 3 sets - 3 hold - 1x daily - 7x weekly  Supine Double Knee to Chest - 2 reps - 1 sets - 15 sec hold - 1x daily - 7x weekly  Supine Piriformis Stretch with Leg Straight - 2 reps - 1 sets - 30 hold - 1x daily - 7x weekly  Heel rises with counter support - 15-20 reps - 3 sets - 1x daily - 7x weekly  Standing Hip Hiking - 10 reps - 1 sets - 5 hold - 1x daily - 7x weekly  Seated Hamstring Stretch - 2 reps - 1 sets - 30 hold - 1x daily - 7x weekly  Sidelying Thoracic Lumbar Rotation - 10 reps - 2x daily - 7x weekly  Supine Lower Trunk Rotation - 10 reps - 3 sets - 1x daily - 7x weekly  Runner's Climb - 10 reps - 1x daily - 7x weekly  Adductor Stretch on Chair - 2 reps - 2 sets - 30 hold - 1x daily - 7x weekly  Sidelying Hip Abduction - 15 reps - 3 sets - 1x daily - 7x weekly  Sit to Stand without Arm Support - 15 reps - 2 sets - 1x daily - 7x weekly  Child's Pose Stretch - 3 reps - 1 sets - 30 hold - 1x daily - 7x weekly  Child's Pose with Sidebending - 3 reps - 1 sets - 30 hold - 1x daily - 7x weekly  Prone Hip Extension on Table - 10 reps - 3 sets - 1x daily - 7x weekly

## 2019-01-05 ENCOUNTER — Ambulatory Visit: Payer: PPO | Attending: Family Medicine | Admitting: Physical Therapy

## 2019-01-05 ENCOUNTER — Encounter: Payer: Self-pay | Admitting: Physical Therapy

## 2019-01-05 ENCOUNTER — Other Ambulatory Visit: Payer: Self-pay

## 2019-01-05 DIAGNOSIS — M545 Low back pain: Secondary | ICD-10-CM | POA: Insufficient documentation

## 2019-01-05 DIAGNOSIS — R2689 Other abnormalities of gait and mobility: Secondary | ICD-10-CM | POA: Insufficient documentation

## 2019-01-05 DIAGNOSIS — G8929 Other chronic pain: Secondary | ICD-10-CM | POA: Insufficient documentation

## 2019-01-05 DIAGNOSIS — M6281 Muscle weakness (generalized): Secondary | ICD-10-CM | POA: Diagnosis not present

## 2019-01-05 NOTE — Therapy (Signed)
[facility] [address] [place], [place], [address] Phone: [phone]   Fax:  [phone]  Physical Therapy Treatment  Patient Details  Name: Tina May MRN: [id number] Date of Birth: [birth year] Referring Provider (PT): [name], MD   Encounter Date: 01/05/2019  PT End of Session - 01/05/19 0902    Visit Number  9    Date for PT Re-Evaluation  01/21/19    Authorization Type  [employer]    PT Start Time  0858    PT Stop Time  0940    PT Time Calculation (min)  42 min    Activity Tolerance  Patient tolerated treatment well    Behavior During Therapy  [facility] for tasks assessed/performed       Past Medical History:  Diagnosis Date  . Allergy   . Arthritis   . Asthma   . 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 Cataract    starting  . Diverticulitis   . Diverticulosis   . External hemorrhoids   . Hepatic cyst   . Hyperlipidemia   . Hypertension   . Pancreatic lesion 03/11/13    Past Surgical History:  Procedure Laterality Date  . ABDOMINAL HYSTERECTOMY    . COLONOSCOPY  01/25/2014   per Dr. [name], diverticulae only, repeat in 10 yrs   . LEFT HEART CATH AND CORONARY ANGIOGRAPHY N/A 01/27/2017   Procedure: LEFT HEART CATH AND CORONARY ANGIOGRAPHY;  Surgeon: Martinique, Peter M, MD;  Location: [place] [facility];  Service: Cardiovascular;  Laterality: N/A;  . SMALL INTESTINE SURGERY     from small intestine    There were no vitals filed for this visit.  Subjective Assessment - 01/05/19 0900    Subjective  I am learning a lot and trying to stand with better posture you showed me last time.  It might be making a difference.    How long can you stand comfortably?  5 min    How long can you walk comfortably?  leg weakness with stairs and walking - 15 min walking, 14 steps    Diagnostic tests  MRI 11/2018 multi-level spondylosis, diffuse central and lateral stenosis, advanced facet hypertrophy    Patient Stated Goals  get legs stronger, keep up with desired level of activity    Currently in Pain?  No/denies                       [facility] Adult PT Treatment/Exercise - 01/05/19 0001      Neuro Re-ed    Neuro Re-ed Details   standing posture, yellow tband hands to hips shoulder ext for TrA, shoulder flexion hands to hips for lumbar MF, 10x5 sec both, then repeated with foot on step and cue to "stand tall through stance leg hip" 10x5 sec fwd/bwd yellow band shoulder pull bil, PT present to verify maintained alignment,       Exercises   Exercises  Lumbar;Knee/Hip;Shoulder      Lumbar Exercises: Stretches   Active Hamstring Stretch  Left;Right;30 seconds    Active Hamstring Stretch Limitations  supine    Lower Trunk Rotation  5 reps;10 seconds    Pelvic Tilt  20 reps    Piriformis Stretch  Left;Right;2 reps;30 seconds    Piriformis Stretch Limitations  supine      Lumbar Exercises: Aerobic   Nustep  seat 7 L3 x 8'      Knee/Hip Exercises: Seated  Sit to Sand  15 reps   with 10# dumbbell              PT Short Term Goals - 12/13/18 0944      PT SHORT TERM GOAL #3   Title  Pt will report at least 20% improvement in fatigue when climbing flight of stairs (14 steps) at home secondary to improving strength of bil LEs.    Baseline  in the AM better, not as day progresses    Status  On-going        PT Long Term Goals - 01/03/19 0854      PT LONG TERM GOAL #1   Title  Pt will be ind in advanced HEP and understand how to safely progress.    Status  On-going      PT LONG TERM GOAL #2   Title  Pt will be able to perform walking/standing activities for at least 30 min before reaching LE fatigue requiring a seated break.    Baseline  played golf for 4 hours x 4 times last week, static standing is more fatiguing    Status  Achieved      PT LONG TERM GOAL #3   Title  Pt will report at least 70% improvement in fatigue levels with climbing flight of stairs at  home (14 steps).    Baseline  no progress at this time    Status  On-going      PT LONG TERM GOAL #4   Title  Pt will achieve at least 4+/5 in all LE muscle groups to improve tolerance of desired daily tasks and recreation activities including golf, gardening, yardwork.    Baseline  yes for golf, no for stair climbing    Status  On-going      PT LONG TERM GOAL #5   Title  Pt will achieve bil hip ROM to [facility] to reduce strain on lumbar spine.    Status  Achieved            Plan - 01/05/19 0924    Clinical Impression Statement  PT focused on lumbopelvic stabilization in standing and with sit to stand today, challenging patient to find neutral/improved standing posture with core activation and dynamic movement such as sit to stand.  She was able to achieve improved standing posture with only min cueing for not locking out lumbar spine at least 80% of the time.  Pt tolerated more standing ther ex before fatigue in LEs today than previous visits.  Pt will continue to benefit from ongoing stabilization training and LE strength along current POC.    PT Frequency  2x / week    PT Duration  8 weeks    PT Treatment/Interventions  ADLs/Self Care Home Management;Cryotherapy;Electrical Stimulation;Iontophoresis 4mg /ml Dexamethasone;Moist Heat;Traction;Gait training;Stair training;Functional mobility training;Therapeutic activities;Therapeutic exercise;Balance training;Neuromuscular re-education;Patient/family education;Manual techniques;Passive range of motion;Dry needling;Taping;Spinal Manipulations;Joint Manipulations    PT Next Visit Plan  continue standing stabilization training as tol, LE strength, update HEP with standing stab using UE tbands    PT Home Exercise Plan  Access Code: [email or web address]    Consulted and Agree with Plan of Care  Patient       Patient will benefit from skilled therapeutic intervention in order to improve the following deficits and impairments:     Visit Diagnosis: Muscle  weakness (generalized)  Other abnormalities of gait and mobility  Chronic bilateral low back pain without sciatica     Problem List Patient Active Problem List  Diagnosis Date Noted  . Abdominal distention 12/26/2018  . Bloating 12/26/2018  . Pancreatic lesion 12/26/2018  . Colon cancer screening 12/26/2018  . Hx of resection of small bowel 12/26/2018  . Palpitations 09/20/2018  . Chronic systolic CHF (congestive heart failure) ([place]) 01/27/2017  . Pleural effusion on right 12/03/2016  . CHF (congestive heart failure), NYHA class I, acute, systolic ([place]) 99991111  . Acute CHF ([place]) 10/26/2016  . Plantar fasciitis, bilateral 03/17/2013  . ANXIETY STATE, UNSPECIFIED 03/27/2009  . Hyperlipidemia 05/04/2007  . Benign essential HTN 05/04/2007  . ALLERGIC RHINITIS 05/04/2007  . Asthma 05/04/2007  . Osteoarthritis 05/04/2007    Baruch Merl, PT 01/05/19 9:40 AM    [facility] [address], [place], [place], [address] Phone: [phone]   Fax:  [phone]  Name: AKEMI BURLEW MRN: [id number] Date of Birth: 07-02-1945

## 2019-01-06 NOTE — H&P (Signed)
Tina May is a 73 y.o. female S/P Hysterectomy,  P: 3-0-0-3 , who  presents for removal of both of her ovaries and tubes because of a complex right ovarian cyst.  In June 2020 the patient began to experience significant abdominal pain and  gastrointestinal dysfunction.  Consequently she underwent a CT scan of abdomen and pelvis for evaluation. That scan showed a complex right ovarian cyst measuring 7.7 cm at its largest dimension that had been seen previously in 2014 and measuring 4.8 cm.   A CA-125 and CEA were both negative and a follow up ultrasound in July 2020 revealed: surgically absent uterus and cervix with complex cystic masses with numerous septations seen in bilateral adnexae; normal ovarian tissue was  not visualized; atrial-venous blood flow documented in both masses; right adnexa-7.4 cm and left adnexa-3.7 cm.  Her abdominal pain was eventually attributed to medications that when changed, resolved her problem.  Since that time she denies any gastrointestinal or bladder dysfunction, vaginitis symptoms or fever.  Given the findings on recent imaging,  the patient has decided to proceed with removal of both ovaries as a part of management of a complex right ovarian cyst.   Past Medical History  OB History: G: 3;  P: 3-0-0-3  SVB: 1968, 1970 and 1980  GYN History:   menarche: 73 YO;   menopause age 47;   Denies history of abnormal PAP smear.  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 Amlodipine   . Atorvastatin Other (See Comments)     muscle cramps  muscle cramps REACTION: muscle cramps  . Diclofenac Nausea And Vomiting  . Morphine And Related Nausea And Vomiting    Denies sensitivity to peanuts, shellfish, soy, latex or adhesives.   ROS: Admits to glasses, occasional joint aches but   denies headache, vision changes, nasal congestion, dysphagia, tinnitus, dizziness, hoarseness, cough,  chest pain, shortness of breath, nausea, vomiting, diarrhea,constipation,  urinary frequency, urgency  dysuria, hematuria, vaginitis symptoms, pelvic pain, swelling of joints,easy bruising,  myalgias, arthralgias, skin rashes, unexplained weight loss and except as is mentioned in the history of present illness, patient's review of systems is otherwise negative.     Physical Exam  Bp: 110/68   P: 78 bpm  R: 16  Weight: 165 lbs.  Height: 5\' 1"   BMI: 31.2  Neck: supple without masses or thyromegaly Lungs: clear to auscultation Heart: regular rate and rhythm Abdomen: soft, non-tender and no organomegaly Pelvic:EGBUS- wnl; vagina-normal  uterus/cervix surgically absent; adnexae-no tenderness or masses Extremities:  no clubbing, cyanosis or edema   Assesment: Complex Right Ovarian Cyst   Disposition:  A discussion was held with patient regarding the indication for her procedure(s) along with the risks, which include but are not limited to: reaction to anesthesia, damage to adjacent organs, infection and excessive  bleeding.  The patient verbalized understanding of these risks and has consented to proceed with a Robot Assisted Bilateral Salpingo-oophorectomy at [facility] on January 13, 2019.   CSN# [id number]   Vicente Weidler J. Florene Glen, PA-C  for Dr. Dede Query. Rivard

## 2019-01-11 ENCOUNTER — Other Ambulatory Visit: Payer: Self-pay

## 2019-01-11 ENCOUNTER — Other Ambulatory Visit (HOSPITAL_COMMUNITY)
Admission: RE | Admit: 2019-01-11 | Discharge: 2019-01-11 | Disposition: A | Discharge status: Home / Self Care | Payer: PPO | Source: Ambulatory Visit | Attending: Obstetrics and Gynecology | Admitting: Obstetrics and Gynecology

## 2019-01-11 ENCOUNTER — Other Ambulatory Visit (HOSPITAL_COMMUNITY): Payer: PPO

## 2019-01-11 ENCOUNTER — Encounter (HOSPITAL_COMMUNITY)
Admission: RE | Admit: 2019-01-11 | Discharge: 2019-01-11 | Disposition: A | Discharge status: Home / Self Care | Payer: PPO | Source: Ambulatory Visit

## 2019-01-11 DIAGNOSIS — Z87891 Personal history of nicotine dependence: Secondary | ICD-10-CM | POA: Diagnosis not present

## 2019-01-11 DIAGNOSIS — Z79899 Other long term (current) drug therapy: Secondary | ICD-10-CM | POA: Diagnosis not present

## 2019-01-11 DIAGNOSIS — Z888 Allergy status to other drugs, medicaments and biological substances status: Secondary | ICD-10-CM | POA: Diagnosis not present

## 2019-01-11 DIAGNOSIS — I110 Hypertensive heart disease with heart failure: Secondary | ICD-10-CM | POA: Diagnosis not present

## 2019-01-11 DIAGNOSIS — Z20828 Contact with and (suspected) exposure to other viral communicable diseases: Secondary | ICD-10-CM | POA: Diagnosis not present

## 2019-01-11 DIAGNOSIS — D270 Benign neoplasm of right ovary: Secondary | ICD-10-CM | POA: Diagnosis not present

## 2019-01-11 DIAGNOSIS — N83292 Other ovarian cyst, left side: Secondary | ICD-10-CM | POA: Diagnosis present

## 2019-01-11 DIAGNOSIS — D271 Benign neoplasm of left ovary: Secondary | ICD-10-CM | POA: Diagnosis not present

## 2019-01-11 DIAGNOSIS — E7800 Pure hypercholesterolemia, unspecified: Secondary | ICD-10-CM | POA: Diagnosis not present

## 2019-01-11 DIAGNOSIS — N83291 Other ovarian cyst, right side: Secondary | ICD-10-CM | POA: Diagnosis present

## 2019-01-11 DIAGNOSIS — I509 Heart failure, unspecified: Secondary | ICD-10-CM | POA: Diagnosis not present

## 2019-01-11 DIAGNOSIS — Z79890 Hormone replacement therapy (postmenopausal): Secondary | ICD-10-CM | POA: Diagnosis not present

## 2019-01-11 DIAGNOSIS — Z885 Allergy status to narcotic agent status: Secondary | ICD-10-CM | POA: Diagnosis not present

## 2019-01-11 DIAGNOSIS — M1990 Unspecified osteoarthritis, unspecified site: Secondary | ICD-10-CM | POA: Diagnosis not present

## 2019-01-11 DIAGNOSIS — N736 Female pelvic peritoneal adhesions (postinfective): Secondary | ICD-10-CM | POA: Diagnosis not present

## 2019-01-11 LAB — CBC
HCT: 41.8 % (ref 36.0–46.0)
Hemoglobin: 13.4 g/dL (ref 12.0–15.0)
MCH: 31.3 pg (ref 26.0–34.0)
MCHC: 32.1 g/dL (ref 30.0–36.0)
MCV: 97.7 fL (ref 80.0–100.0)
Platelets: 234 10*3/uL (ref 150–400)
RBC: 4.28 MIL/uL (ref 3.87–5.11)
RDW: 13.2 % (ref 11.5–15.5)
WBC: 7.1 10*3/uL (ref 4.0–10.5)
nRBC: 0 % (ref 0.0–0.2)

## 2019-01-11 LAB — BASIC METABOLIC PANEL
Anion gap: 11 (ref 5–15)
BUN: 33 mg/dL — ABNORMAL HIGH (ref 8–23)
CO2: 22 mmol/L (ref 22–32)
Calcium: 9.3 mg/dL (ref 8.9–10.3)
Chloride: 109 mmol/L (ref 98–111)
Creatinine, Ser: 0.85 mg/dL (ref 0.44–1.00)
GFR calc Af Amer: >60 mL/min (ref >60)
GFR calc non Af Amer: >60 mL/min (ref >60)
Glucose, Bld: 103 mg/dL — ABNORMAL HIGH (ref 70–99)
Potassium: 4 mmol/L (ref 3.5–5.1)
Sodium: 142 mmol/L (ref 135–145)

## 2019-01-12 ENCOUNTER — Other Ambulatory Visit: Payer: Self-pay

## 2019-01-12 ENCOUNTER — Encounter (HOSPITAL_BASED_OUTPATIENT_CLINIC_OR_DEPARTMENT_OTHER): Payer: Self-pay | Admitting: *Deleted

## 2019-01-12 ENCOUNTER — Encounter: Payer: PPO | Admitting: Physical Therapy

## 2019-01-12 LAB — SARS CORONAVIRUS 2 (TAT 6-24 HRS): SARS Coronavirus 2: NEGATIVE

## 2019-01-12 NOTE — Progress Notes (Addendum)
Spoke w/ pt via phone for pre-op interview.  Pt verbalized understanding npo after mn w/ exception clear liquids until 0530 then nothing by mouth. Pt picked up drink with handout instructions at lab appt 01-11-2019.  Per pt will not drink ensure does not like strawberry.  Arrive at [employer].  Current lab results and covid results 01-11-2019 in epic and chart.  Current ekg in chart and epic.     PCP -   Dr Annie Main fry Cardiologist -  Dr Cathie Olden (hx CHF systolic chronic ,  NICM, HTN)  lov note w/ clearance dated 11-12-2018 in chart and epic  Chest x-ray -  10-26-2016 epic EKG -  09-20-2018 epic Stress Test -  Stress echo 12-22-2016 epic ECHO -  04-22-2018  epic Cardiac Cath -  01-27-2017 epic  Sleep Study -  No CPAP -   Fasting Blood Sugar -  N/A Checks Blood Sugar _____ times a day  Blood Thinner Instructions:  no Aspirin Instructions:  ASA 81mg  Last Dose:  Today, per pt was told by dr rivard she needed to stop  Anesthesia review:   Pt denies any cardiac s&s, sob, peripheral swelling.  Patient denies shortness of breath, fever, cough and chest pain at phone interview.   ADDENDUM:  Chart reviewed by anesthesia, Konrad Felix PA, ok to proceed.

## 2019-01-12 NOTE — Anesthesia Preprocedure Evaluation (Addendum)
Anesthesia Evaluation  Patient identified by MRN, date of birth, ID band Patient awake    Airway Mallampati: II  TM Distance: >3 FB     Dental   Pulmonary former smoker,    breath sounds clear to auscultation       Cardiovascular hypertension, +CHF   Rhythm:Regular Rate:Normal     Neuro/Psych    GI/Hepatic negative GI ROS, Neg liver ROS,   Endo/Other  negative endocrine ROS  Renal/GU      Musculoskeletal   Abdominal   Peds  Hematology   Anesthesia Other Findings   Reproductive/Obstetrics                           Anesthesia Physical Anesthesia Plan  ASA: III  Anesthesia Plan: General   Post-op Pain Management:    Induction: Intravenous  PONV Risk Score and Plan: 3 and Ondansetron, Dexamethasone and Midazolam  Airway Management Planned: Oral ETT  Additional Equipment:   Intra-op Plan:   Post-operative Plan:   Informed Consent: I have reviewed the patients History and Physical, chart, labs and discussed the procedure including the risks, benefits and alternatives for the proposed anesthesia with the patient or authorized representative who has indicated his/her understanding and acceptance.     Dental advisory given  Plan Discussed with: Anesthesiologist and CRNA  Anesthesia Plan Comments: (Per cardiologist, Dr. Cathie Olden, 11/12/2018, "The patient has had some changes on a recent scan.  She needs to have an oophorectomy.  She is not had any episodes of chest pain or shortness of breath.  Her ejection fraction is stable in the 45 to 50% range.  She is at low risk for her upcoming GYN surgery.")      Anesthesia Quick Evaluation

## 2019-01-13 ENCOUNTER — Ambulatory Visit (HOSPITAL_BASED_OUTPATIENT_CLINIC_OR_DEPARTMENT_OTHER): Payer: PPO | Admitting: Physician Assistant

## 2019-01-13 ENCOUNTER — Encounter (HOSPITAL_BASED_OUTPATIENT_CLINIC_OR_DEPARTMENT_OTHER)
Admission: RE | Disposition: A | Discharge status: Home / Self Care | Payer: Self-pay | Source: Home / Self Care | Attending: Obstetrics and Gynecology

## 2019-01-13 ENCOUNTER — Encounter (HOSPITAL_BASED_OUTPATIENT_CLINIC_OR_DEPARTMENT_OTHER): Payer: Self-pay | Admitting: General Practice

## 2019-01-13 ENCOUNTER — Ambulatory Visit (HOSPITAL_BASED_OUTPATIENT_CLINIC_OR_DEPARTMENT_OTHER)
Admission: RE | Admit: 2019-01-13 | Discharge: 2019-01-13 | Disposition: A | Discharge status: Home / Self Care | Payer: PPO | Attending: Obstetrics and Gynecology | Admitting: Obstetrics and Gynecology

## 2019-01-13 DIAGNOSIS — N83292 Other ovarian cyst, left side: Secondary | ICD-10-CM | POA: Diagnosis not present

## 2019-01-13 DIAGNOSIS — Z885 Allergy status to narcotic agent status: Secondary | ICD-10-CM | POA: Diagnosis not present

## 2019-01-13 DIAGNOSIS — N83201 Unspecified ovarian cyst, right side: Secondary | ICD-10-CM

## 2019-01-13 DIAGNOSIS — I110 Hypertensive heart disease with heart failure: Secondary | ICD-10-CM | POA: Diagnosis not present

## 2019-01-13 DIAGNOSIS — D271 Benign neoplasm of left ovary: Secondary | ICD-10-CM | POA: Diagnosis not present

## 2019-01-13 DIAGNOSIS — Z20828 Contact with and (suspected) exposure to other viral communicable diseases: Secondary | ICD-10-CM | POA: Diagnosis not present

## 2019-01-13 DIAGNOSIS — N83209 Unspecified ovarian cyst, unspecified side: Secondary | ICD-10-CM | POA: Diagnosis not present

## 2019-01-13 DIAGNOSIS — D270 Benign neoplasm of right ovary: Secondary | ICD-10-CM | POA: Diagnosis not present

## 2019-01-13 DIAGNOSIS — Z87891 Personal history of nicotine dependence: Secondary | ICD-10-CM | POA: Insufficient documentation

## 2019-01-13 DIAGNOSIS — Z79890 Hormone replacement therapy (postmenopausal): Secondary | ICD-10-CM | POA: Insufficient documentation

## 2019-01-13 DIAGNOSIS — Z79899 Other long term (current) drug therapy: Secondary | ICD-10-CM | POA: Insufficient documentation

## 2019-01-13 DIAGNOSIS — R8569 Abnormal cytological findings in specimens from other digestive organs and abdominal cavity: Secondary | ICD-10-CM | POA: Diagnosis not present

## 2019-01-13 DIAGNOSIS — I509 Heart failure, unspecified: Secondary | ICD-10-CM | POA: Insufficient documentation

## 2019-01-13 DIAGNOSIS — N736 Female pelvic peritoneal adhesions (postinfective): Secondary | ICD-10-CM | POA: Insufficient documentation

## 2019-01-13 DIAGNOSIS — E7800 Pure hypercholesterolemia, unspecified: Secondary | ICD-10-CM | POA: Insufficient documentation

## 2019-01-13 DIAGNOSIS — N83291 Other ovarian cyst, right side: Secondary | ICD-10-CM | POA: Diagnosis not present

## 2019-01-13 DIAGNOSIS — I5022 Chronic systolic (congestive) heart failure: Secondary | ICD-10-CM | POA: Diagnosis not present

## 2019-01-13 DIAGNOSIS — Z888 Allergy status to other drugs, medicaments and biological substances status: Secondary | ICD-10-CM | POA: Diagnosis not present

## 2019-01-13 DIAGNOSIS — M1990 Unspecified osteoarthritis, unspecified site: Secondary | ICD-10-CM | POA: Diagnosis not present

## 2019-01-13 DIAGNOSIS — N7011 Chronic salpingitis: Secondary | ICD-10-CM

## 2019-01-13 HISTORY — DX: Personal history of other malignant neoplasm of skin: Z85.828

## 2019-01-13 HISTORY — DX: Personal history of other malignant neoplasm of skin: Z98.890

## 2019-01-13 HISTORY — DX: Unspecified asthma, uncomplicated: J45.909

## 2019-01-13 HISTORY — PX: ROBOTIC ASSISTED SALPINGO OOPHERECTOMY: SHX6082

## 2019-01-13 HISTORY — DX: Other cardiomyopathies: I42.8

## 2019-01-13 HISTORY — DX: Other ovarian cyst, right side: N83.291

## 2019-01-13 HISTORY — DX: Palpitations: R00.2

## 2019-01-13 SURGERY — SALPINGO-OOPHORECTOMY, ROBOT-ASSISTED
Anesthesia: General | Site: Abdomen | Laterality: Bilateral

## 2019-01-13 MED ORDER — LIDOCAINE HCL (CARDIAC) PF 100 MG/5ML IV SOSY
PREFILLED_SYRINGE | INTRAVENOUS | Status: DC | PRN
  Administered 2019-01-13: 60 mg via INTRAVENOUS

## 2019-01-13 MED ORDER — KETOROLAC TROMETHAMINE 30 MG/ML IJ SOLN
INTRAMUSCULAR | Status: AC
  Filled 2019-01-13: qty 1

## 2019-01-13 MED ORDER — EPHEDRINE 5 MG/ML INJ
INTRAVENOUS | Status: AC
  Filled 2019-01-13: qty 10

## 2019-01-13 MED ORDER — CELECOXIB 200 MG PO CAPS
ORAL_CAPSULE | ORAL | Status: AC
  Filled 2019-01-13: qty 2

## 2019-01-13 MED ORDER — ENSURE PRE-SURGERY PO LIQD
296.0000 mL | Freq: Once | ORAL | Status: DC
  Filled 2019-01-13: qty 296

## 2019-01-13 MED ORDER — FENTANYL CITRATE (PF) 100 MCG/2ML IJ SOLN
INTRAMUSCULAR | Status: AC
  Filled 2019-01-13: qty 2

## 2019-01-13 MED ORDER — ACETAMINOPHEN 500 MG PO TABS
1000.0000 mg | ORAL_TABLET | ORAL | Status: AC
  Administered 2019-01-13: 07:00:00 1000 mg via ORAL
  Filled 2019-01-13: qty 2

## 2019-01-13 MED ORDER — GABAPENTIN 300 MG PO CAPS
ORAL_CAPSULE | ORAL | Status: AC
  Filled 2019-01-13: qty 1

## 2019-01-13 MED ORDER — SUGAMMADEX SODIUM 200 MG/2ML IV SOLN
INTRAVENOUS | Status: DC | PRN
  Administered 2019-01-13: 160 mg via INTRAVENOUS

## 2019-01-13 MED ORDER — ROCURONIUM BROMIDE 10 MG/ML (PF) SYRINGE
PREFILLED_SYRINGE | INTRAVENOUS | Status: AC
  Filled 2019-01-13: qty 10

## 2019-01-13 MED ORDER — SUGAMMADEX SODIUM 200 MG/2ML IV SOLN: INTRAVENOUS | Status: DC | PRN

## 2019-01-13 MED ORDER — FENTANYL CITRATE (PF) 100 MCG/2ML IJ SOLN
25.0000 ug | INTRAMUSCULAR | Status: DC | PRN
  Filled 2019-01-13: qty 1

## 2019-01-13 MED ORDER — SODIUM CHLORIDE 0.9 % IR SOLN
Status: DC | PRN
  Administered 2019-01-13: 3000 mL

## 2019-01-13 MED ORDER — DEXAMETHASONE SODIUM PHOSPHATE 10 MG/ML IJ SOLN
INTRAMUSCULAR | Status: DC | PRN
  Administered 2019-01-13: 8 mg via INTRAVENOUS

## 2019-01-13 MED ORDER — IBUPROFEN 600 MG PO TABS: ORAL_TABLET | ORAL | 0 refills | Status: DC

## 2019-01-13 MED ORDER — SCOPOLAMINE 1 MG/3DAYS TD PT72
MEDICATED_PATCH | TRANSDERMAL | Status: AC
  Filled 2019-01-13: qty 1

## 2019-01-13 MED ORDER — PHENYLEPHRINE 40 MCG/ML (10ML) SYRINGE FOR IV PUSH (FOR BLOOD PRESSURE SUPPORT)
PREFILLED_SYRINGE | INTRAVENOUS | Status: DC | PRN
  Administered 2019-01-13 (×3): 80 ug via INTRAVENOUS

## 2019-01-13 MED ORDER — FENTANYL CITRATE (PF) 250 MCG/5ML IJ SOLN
INTRAMUSCULAR | Status: AC
  Filled 2019-01-13: qty 5

## 2019-01-13 MED ORDER — EPHEDRINE SULFATE-NACL 50-0.9 MG/10ML-% IV SOSY
PREFILLED_SYRINGE | INTRAVENOUS | Status: DC | PRN
  Administered 2019-01-13 (×2): 5 mg via INTRAVENOUS
  Administered 2019-01-13: 2.5 mg via INTRAVENOUS
  Administered 2019-01-13: 5 mg via INTRAVENOUS

## 2019-01-13 MED ORDER — LACTATED RINGERS IV SOLN
INTRAVENOUS | Status: DC
  Administered 2019-01-13 (×2): via INTRAVENOUS
  Filled 2019-01-13: qty 1000

## 2019-01-13 MED ORDER — DEXAMETHASONE SODIUM PHOSPHATE 4 MG/ML IJ SOLN
4.0000 mg | INTRAMUSCULAR | Status: DC
  Filled 2019-01-13: qty 1

## 2019-01-13 MED ORDER — KETOROLAC TROMETHAMINE 30 MG/ML IJ SOLN
INTRAMUSCULAR | Status: DC | PRN
  Administered 2019-01-13: 30 mg via INTRAVENOUS

## 2019-01-13 MED ORDER — ACETAMINOPHEN 500 MG PO TABS
ORAL_TABLET | ORAL | Status: AC
  Filled 2019-01-13: qty 2

## 2019-01-13 MED ORDER — LIDOCAINE 2% (20 MG/ML) 5 ML SYRINGE
INTRAMUSCULAR | Status: AC
  Filled 2019-01-13: qty 5

## 2019-01-13 MED ORDER — PROPOFOL 10 MG/ML IV BOLUS
INTRAVENOUS | Status: DC | PRN
  Administered 2019-01-13: 140 mg via INTRAVENOUS

## 2019-01-13 MED ORDER — CELECOXIB 400 MG PO CAPS
400.0000 mg | ORAL_CAPSULE | ORAL | Status: AC
  Administered 2019-01-13: 07:00:00 400 mg via ORAL
  Filled 2019-01-13: qty 1

## 2019-01-13 MED ORDER — SODIUM CHLORIDE 0.9 % IV SOLN
INTRAVENOUS | Status: DC | PRN
  Administered 2019-01-13: 09:00:00 100 mL

## 2019-01-13 MED ORDER — DEXAMETHASONE SODIUM PHOSPHATE 10 MG/ML IJ SOLN
INTRAMUSCULAR | Status: AC
  Filled 2019-01-13: qty 1

## 2019-01-13 MED ORDER — LACTATED RINGERS IV SOLN
INTRAVENOUS | Status: DC
  Administered 2019-01-13: 07:00:00 via INTRAVENOUS
  Filled 2019-01-13: qty 1000

## 2019-01-13 MED ORDER — ONDANSETRON HCL 4 MG/2ML IJ SOLN
INTRAMUSCULAR | Status: AC
  Filled 2019-01-13: qty 2

## 2019-01-13 MED ORDER — GABAPENTIN 300 MG PO CAPS
300.0000 mg | ORAL_CAPSULE | ORAL | Status: AC
  Administered 2019-01-13: 07:00:00 300 mg via ORAL
  Filled 2019-01-13: qty 1

## 2019-01-13 MED ORDER — ONDANSETRON HCL 4 MG/2ML IJ SOLN
INTRAMUSCULAR | Status: DC | PRN
  Administered 2019-01-13: 4 mg via INTRAVENOUS

## 2019-01-13 MED ORDER — ROCURONIUM BROMIDE 100 MG/10ML IV SOLN
INTRAVENOUS | Status: DC | PRN
  Administered 2019-01-13: 50 mg via INTRAVENOUS

## 2019-01-13 MED ORDER — PROPOFOL 10 MG/ML IV BOLUS
INTRAVENOUS | Status: AC
  Filled 2019-01-13: qty 20

## 2019-01-13 MED ORDER — FENTANYL CITRATE (PF) 250 MCG/5ML IJ SOLN
INTRAMUSCULAR | Status: DC | PRN
  Administered 2019-01-13: 150 ug via INTRAVENOUS
  Administered 2019-01-13: 50 ug via INTRAVENOUS

## 2019-01-13 MED ORDER — SCOPOLAMINE 1 MG/3DAYS TD PT72
1.0000 | MEDICATED_PATCH | TRANSDERMAL | Status: DC
  Administered 2019-01-13: 07:00:00 1.5 mg via TRANSDERMAL
  Filled 2019-01-13: qty 1

## 2019-01-13 MED ORDER — TRAMADOL HCL 50 MG PO TABS: ORAL_TABLET | ORAL | 0 refills | Status: DC

## 2019-01-13 SURGICAL SUPPLY — 69 items
BARRIER ADHS 3X4 INTERCEED (GAUZE/BANDAGES/DRESSINGS) IMPLANT
BLADE LAP MORCELLATOR 15X9.5 (ELECTROSURGICAL) IMPLANT
BLADE MORCELLATOR EXT  12.5X15 (ELECTROSURGICAL)
BLADE MORCELLATOR EXT 12.5X15 (ELECTROSURGICAL) IMPLANT
CANISTER SUCT 3000ML PPV (MISCELLANEOUS) IMPLANT
COVER BACK TABLE 60X90IN (DRAPES) ×2 IMPLANT
COVER TIP SHEARS 8 DVNC (MISCELLANEOUS) ×1 IMPLANT
COVER TIP SHEARS 8MM DA VINCI (MISCELLANEOUS) ×1
COVER WAND RF STERILE (DRAPES) ×2 IMPLANT
DECANTER SPIKE VIAL GLASS SM (MISCELLANEOUS) ×8 IMPLANT
DEFOGGER SCOPE WARMER CLEARIFY (MISCELLANEOUS) ×2 IMPLANT
DERMABOND ADVANCED (GAUZE/BANDAGES/DRESSINGS) ×1
DERMABOND ADVANCED .7 DNX12 (GAUZE/BANDAGES/DRESSINGS) ×1 IMPLANT
DRAPE ARM DVNC X/XI (DISPOSABLE) ×4 IMPLANT
DRAPE COLUMN DVNC XI (DISPOSABLE) ×1 IMPLANT
DRAPE DA VINCI XI ARM (DISPOSABLE) ×4
DRAPE DA VINCI XI COLUMN (DISPOSABLE) ×1
DURAPREP 26ML APPLICATOR (WOUND CARE) ×2 IMPLANT
ELECT REM PT RETURN 9FT ADLT (ELECTROSURGICAL) ×2
ELECTRODE REM PT RTRN 9FT ADLT (ELECTROSURGICAL) ×1 IMPLANT
GAUZE 4X4 16PLY RFD (DISPOSABLE) ×2 IMPLANT
GLOVE BIO SURGEON STRL SZ 6.5 (GLOVE) ×2 IMPLANT
GLOVE BIO SURGEON STRL SZ7 (GLOVE) ×4 IMPLANT
GLOVE BIOGEL PI IND STRL 6.5 (GLOVE) ×1 IMPLANT
GLOVE BIOGEL PI IND STRL 7.0 (GLOVE) ×5 IMPLANT
GLOVE BIOGEL PI INDICATOR 6.5 (GLOVE) ×1
GLOVE BIOGEL PI INDICATOR 7.0 (GLOVE) ×5
GLOVE ECLIPSE 6.5 STRL STRAW (GLOVE) ×6 IMPLANT
IRRIG SUCT STRYKERFLOW 2 WTIP (MISCELLANEOUS) ×2
IRRIGATION SUCT STRKRFLW 2 WTP (MISCELLANEOUS) ×1 IMPLANT
LEGGING LITHOTOMY PAIR STRL (DRAPES) ×2 IMPLANT
MANIFOLD NEPTUNE II (INSTRUMENTS) ×2 IMPLANT
NS IRRIG 500ML POUR BTL (IV SOLUTION) ×2 IMPLANT
OBTURATOR OPTICAL STANDARD 8MM (TROCAR) ×1
OBTURATOR OPTICAL STND 8 DVNC (TROCAR) ×1
OBTURATOR OPTICALSTD 8 DVNC (TROCAR) ×1 IMPLANT
OCCLUDER COLPOPNEUMO (BALLOONS) ×2 IMPLANT
PACK ROBOT WH (CUSTOM PROCEDURE TRAY) ×2 IMPLANT
PACK ROBOTIC GOWN (GOWN DISPOSABLE) ×2 IMPLANT
PACK TRENDGUARD 450 HYBRID PRO (MISCELLANEOUS) ×1 IMPLANT
PAD PREP 24X48 CUFFED NSTRL (MISCELLANEOUS) ×2 IMPLANT
POUCH SPECIMEN RETRIEVAL 10MM (ENDOMECHANICALS) ×2 IMPLANT
PROTECTOR NERVE ULNAR (MISCELLANEOUS) ×6 IMPLANT
SEAL CANN UNIV 5-8 DVNC XI (MISCELLANEOUS) ×3 IMPLANT
SEAL XI 5MM-8MM UNIVERSAL (MISCELLANEOUS) ×3
SEALER VESSEL DA VINCI XI (MISCELLANEOUS) ×1
SEALER VESSEL EXT DVNC XI (MISCELLANEOUS) ×1 IMPLANT
SET TRI-LUMEN FLTR TB AIRSEAL (TUBING) ×2 IMPLANT
STRIP CLOSURE SKIN 1/4X3 (GAUZE/BANDAGES/DRESSINGS) ×2 IMPLANT
STRIP CLOSURE SKIN 1/4X4 (GAUZE/BANDAGES/DRESSINGS) ×2 IMPLANT
SUT MNCRL AB 3-0 PS2 27 (SUTURE) ×4 IMPLANT
SUT VIC AB 0 CT1 27 (SUTURE) ×4
SUT VIC AB 0 CT1 27XBRD ANBCTR (SUTURE) ×2 IMPLANT
SUT VICRYL 0 UR6 27IN ABS (SUTURE) ×4 IMPLANT
SUT VLOC 180 0 9IN  GS21 (SUTURE)
SUT VLOC 180 0 9IN GS21 (SUTURE) IMPLANT
TIP RUMI ORANGE 6.7MMX12CM (TIP) IMPLANT
TIP UTERINE 5.1X6CM LAV DISP (MISCELLANEOUS) IMPLANT
TIP UTERINE 6.7X10CM GRN DISP (MISCELLANEOUS) IMPLANT
TIP UTERINE 6.7X6CM WHT DISP (MISCELLANEOUS) IMPLANT
TIP UTERINE 6.7X8CM BLUE DISP (MISCELLANEOUS) IMPLANT
TOWEL OR 17X26 10 PK STRL BLUE (TOWEL DISPOSABLE) ×2 IMPLANT
TRAP SPECIMEN MUCOUS 40CC (MISCELLANEOUS) ×2 IMPLANT
TRAY FOLEY W/BAG SLVR 16FR (SET/KITS/TRAYS/PACK) ×1
TRAY FOLEY W/BAG SLVR 16FR ST (SET/KITS/TRAYS/PACK) ×1 IMPLANT
TRENDGUARD 450 HYBRID PRO PACK (MISCELLANEOUS) ×2
TROCAR PORT AIRSEAL 8X120 (TROCAR) ×2 IMPLANT
TROCAR XCEL NON-BLD 11X100MML (ENDOMECHANICALS) ×2 IMPLANT
WATER STERILE IRR 1000ML POUR (IV SOLUTION) IMPLANT

## 2019-01-13 NOTE — Op Note (Signed)
Preoperative diagnosis: Bilateral complex ovarian cysts with normal tumor markers  Postoperative diagnosis: same  Anesthesia: General   Anesthesiologist: Dr. Nyoka Cowden  Procedure: Robotically assisted bilateral salpyngo-oophorectomy with pelvic washings  Surgeon: Dr. Katharine Look Julita Ozbun   Assistant: Earnstine Regal P.A.-C .  Estimated blood loss: minimal  Procedure:   After being informed of the planned procedure with possible complications including but not limited to bleeding, infection, injury to other organs, need for laparotomy, expected hospital stay and recovery, informed consent is obtained and patient is taken to or #5. She is placed in lithotomy position on Trengard with both arms padded and tucked on each side and bilateral knee-high sequential compressive devices. She is given general anesthesia with endotracheal intubation without any complication. She is prepped and draped in a sterile fashion. A Foley catheter is inserted in her bladder.  Pelvic exam reveals: absent uterus, non-palpable adnexa. A sponge on a stick is placed in the vagina.    Trocar placement is decided. We infiltrate at the umbilicus with 10 cc of ropivacaine per protocol and perform a 10 mm semi-elleptical incision which is brought down bluntly to the fascia. The fascia is identified and grasped with Coker forceps. The fascia is incised with Mayo scissors. Peritoneum is entered bluntly. A pursestring suture of 0 Vicryl is placed on the fascia and a 10 mm Hassan trocar is easily inserted in the abdominal cavity held in placed with a Purstring suture. This allows for easy insufflation of a pneumoperitoneum using warmed CO2 at a maximum pressure of 15 mm of mercury. We  placed one 37mm robotic trocar on the left, one 49mm robotic trocar on the right and one 8 mm patient's side Air-Seal assistant trocar on the right after infiltrating every site with ropivacaine per protocol. The robot is docked on the right of the patient  after positioning her in Trendelenburg. A Vessel Seal  is inserted in arm #4 and a Long bipolar forcep is inserted in arm #2.  Preparation and docking is completed in 42 minutes.   Observation: We note adhesions between the omentum and the abdominal wall. A multicystic right ovarian mass measuring 7 cm is easily mobilized with no adhesions and a smooth surface. We note a multilocular left hydrosalpynx which is adherent to the left pelvic wall. Liver is normal. Appendix is not seen. There is no free fluid in the pelvis.   We begin with pelvic washings then 60 cc of Ropivacaine 0.5 % diluted 1 in 1 is sent in the pelvis and the patient is positioned in reverse Trendelenburg  After identifying the full course of the right ureter, we Seal and section  the right infundibulopelvic ligament .[name] We then seal and section the broad ligament to free the ovary and tube without rupture of the cyst.It is deposited in the posterior cul-de-sac.  After identifying the course of the left ureter, we Seal and section the left infundibulopelvic ligament. Changing to Monopolar scissors in arm #4, we proceed with systematic hot and cold dissection of the hydrosalpynx from the pelvic wall until it is freed entirely with its small normal appearing ovary.  We irrigate profusely with warm saline. Hemostasis is adequate. Both ureters are confirmed intact with good peristaltism.  Console time is 95 minutes. Instruments are removed and the robot is undocked.  The 10 mm umbilical trocar is replaced by a 12 mm trocar.  A 10 mm endocatch bag is placed in the abdomen and both ovaries with tubes are placed in the bag and  exteriorized through the umbilical incision. There we aspirate the cyst fluid and remove the specimen without spillage.   All trochars are removed under direct visualization after evacuating the pneumoperitoneum.   The fascia of the supraumbilical incision is closed with the previously placed pursestring  suture of 0 Vicryl. All incisions are then closed with subcuticular suture of 3-0 Monocryl and Dermabond.    Instrument and sponge count is complete x2. Estimated blood loss is minimal.   The procedure is well tolerated by the patient who is taken to recovery room in a well and stable condition.   Specimen: Both ovaries and both tubes sent to pathology separately identified

## 2019-01-13 NOTE — Interval H&P Note (Signed)
History and Physical Interval Note:  01/13/2019 7:30 AM  Tina May  has presented today for surgery, with the diagnosis of Right Ovarian Complex Cyst.  The various methods of treatment have been discussed with the patient and family. After consideration of risks, benefits and other options for treatment, the patient has consented to  Procedure(s): XI ROBOTIC ASSISTED SALPINGO OOPHORECTOMY (Bilateral) as a surgical intervention.  The patient's history has been reviewed, patient examined, no change in status, stable for surgery.  I have reviewed the patient's chart and labs.  Questions were answered to the patient's satisfaction.     Katharine Look A Ernisha Sorn

## 2019-01-13 NOTE — Transfer of Care (Signed)
Immediate Anesthesia Transfer of Care Note  Patient: Tina May  Procedure(s) Performed: XI ROBOTIC ASSISTED SALPINGO OOPHORECTOMY (Bilateral Abdomen)  Patient Location: PACU  Anesthesia Type:General  Level of Consciousness: drowsy and patient cooperative  Airway & Oxygen Therapy: Patient Spontanous Breathing and Patient connected to face mask oxygen  Post-op Assessment: Report given to RN and Post -op Vital signs reviewed and stable  Post vital signs: Reviewed and stable  Last Vitals:  Vitals Value Taken Time  BP 118/59 01/13/19 1015  Temp 35.7 C 01/13/19 1015  Pulse 71 01/13/19 1017  Resp 18 01/13/19 1017  SpO2 100 % 01/13/19 1017  Vitals shown include unvalidated device data.  Last Pain:  Vitals:   01/13/19 0700  TempSrc: Oral  PainSc: 0-No pain         Complications: No apparent anesthesia complications

## 2019-01-13 NOTE — Anesthesia Postprocedure Evaluation (Signed)
Anesthesia Post Note  Patient: Tina May  Procedure(s) Performed: XI ROBOTIC ASSISTED SALPINGO OOPHORECTOMY (Bilateral Abdomen)     Patient location during evaluation: PACU Anesthesia Type: General Level of consciousness: awake Pain management: pain level controlled Respiratory status: spontaneous breathing Cardiovascular status: stable Postop Assessment: no apparent nausea or vomiting Anesthetic complications: no    Last Vitals:  Vitals:   01/13/19 1100 01/13/19 1130  BP: 117/70 128/70  Pulse: 69 71  Resp: 17 14  Temp:  (!) 36.4 C  SpO2: 94% 95%    Last Pain:  Vitals:   01/13/19 1100  TempSrc:   PainSc: 0-No pain                 Scotland Dost

## 2019-01-13 NOTE — Anesthesia Procedure Notes (Signed)
Procedure Name: Intubation Date/Time: 01/13/2019 7:43 AM Performed by: Raenette Rover, CRNA Pre-anesthesia Checklist: Patient identified, Emergency Drugs available, Suction available and Patient being monitored Patient Re-evaluated:Patient Re-evaluated prior to induction Oxygen Delivery Method: Circle system utilized Preoxygenation: Pre-oxygenation with 100% oxygen Induction Type: IV induction Ventilation: Mask ventilation without difficulty Laryngoscope Size: Mac and 3 Grade View: Grade I Tube type: Oral Tube size: 7.0 mm Number of attempts: 1 Airway Equipment and Method: Stylet Placement Confirmation: ETT inserted through vocal cords under direct vision,  positive ETCO2 and breath sounds checked- equal and bilateral Secured at: 21 cm Tube secured with: Tape Dental Injury: Teeth and Oropharynx as per pre-operative assessment

## 2019-01-13 NOTE — Discharge Instructions (Signed)
Call [facility] @ [phone] if:  You have a temperature greater than or equal to 100.4 degrees Farenheit orally You have pain that is not made better by the pain medication given and taken as directed You have excessive bleeding or problems urinating  Take Colace (Docusate Sodium/Stool Softener) 100 mg 2-3 times daily while taking narcotic pain medicine to avoid constipation or until bowel movements are regular. Take Ibuprofen 600 mg with food every 6 hours for 5 days then as needed for pain.  DO NOT TAKE WITH MELOXICAM  You may drive after 48 hours You may walk up steps  You may shower tomorrow You may resume a regular diet  Keep incisions clean and dry Do not lift over 15 pounds for 6 weeks Avoid anything in vagina until after your post-operative visit   Post Anesthesia Home Care Instructions  Activity: Get plenty of rest for the remainder of the day. A responsible adult should stay with you for 24 hours following the procedure.  For the next 24 hours, DO NOT: -Drive a car -Paediatric nurse -Drink alcoholic beverages -Take any medication unless instructed by your physician -Make any legal decisions or sign important papers.  Meals: Start with liquid foods such as gelatin or soup. Progress to regular foods as tolerated. Avoid greasy, spicy, heavy foods. If nausea and/or vomiting occur, drink only clear liquids until the nausea and/or vomiting subsides. Call your physician if vomiting continues.  Special Instructions/Symptoms: Your throat may feel dry or sore from the anesthesia or the breathing tube placed in your throat during surgery. If this causes discomfort, gargle with warm salt water. The discomfort should disappear within 24 hours.  If you had a scopolamine patch placed behind your ear for the management of post- operative nausea and/or vomiting:  1. The medication in the patch is effective for 72 hours, after which it should be removed.  Wrap patch in  a tissue and discard in the trash. Wash hands thoroughly with soap and water. 2. You may remove the patch earlier than 72 hours if you experience unpleasant side effects which may include dry mouth, dizziness or visual disturbances. 3. Avoid touching the patch. Wash your hands with soap and water after contact with the patch.

## 2019-01-14 ENCOUNTER — Encounter (HOSPITAL_BASED_OUTPATIENT_CLINIC_OR_DEPARTMENT_OTHER): Payer: Self-pay | Admitting: Obstetrics and Gynecology

## 2019-01-17 ENCOUNTER — Ambulatory Visit: Payer: PPO | Admitting: Physical Therapy

## 2019-01-17 ENCOUNTER — Other Ambulatory Visit: Payer: Self-pay

## 2019-01-17 ENCOUNTER — Encounter: Payer: Self-pay | Admitting: Physical Therapy

## 2019-01-17 DIAGNOSIS — G8929 Other chronic pain: Secondary | ICD-10-CM

## 2019-01-17 DIAGNOSIS — M6281 Muscle weakness (generalized): Secondary | ICD-10-CM | POA: Diagnosis not present

## 2019-01-17 DIAGNOSIS — R2689 Other abnormalities of gait and mobility: Secondary | ICD-10-CM

## 2019-01-17 NOTE — Therapy (Signed)
[facility] [address] [place], [place], [address] Phone: [phone]   Fax:  [phone]  Physical Therapy Treatment  Patient Details  Name: Tina May MRN: [id number] Date of Birth: May 09, 1945 Referring Provider (PT): [name], MD  Progress Note Reporting Period 11/26/18 to 01/17/19  See note below for Objective Data and Assessment of Progress/Goals.      Encounter Date: 01/17/2019  PT End of Session - 01/17/19 0942    Visit Number  10    Date for PT Re-Evaluation  01/21/19    Authorization Type  [employer]    PT Start Time  0942    PT Stop Time  1023    PT Time Calculation (min)  41 min    Activity Tolerance  Patient tolerated treatment well    Behavior During Therapy  [facility] for tasks assessed/performed       Past Medical History:  Diagnosis Date  . Arthritis   . Complex cyst of right ovary   . Diverticulosis   . External hemorrhoids   . History of basal cell carcinoma (BCC) excision   . Hyperlipidemia   . Hypertension    cardiologist-- dr Cathie Olden;  per cardiac cath 01-27-2017  no sig. cad,  10% pRCA to mRCA  . Intermittent palpitations (01-12-2019 per denies any palpitations)   event monitor 10-27-2018 (dr Cathie Olden)  SR including ST and SB with two episodes NSVT  . Mild asthma   . NICM (nonischemic cardiomyopathy) ([place]) followed by dr Cathie Olden   dx 06/ 2018  ef 25-30%;   stress echo 12-22-2016 no ischemia, ef 30-35%;   last echo 04-22-2018 ef 45-50%  . Pancreatic lesion 03/11/2013   last CT abd. 10-27-2018, stable  . Systolic CHF, chronic ([place]) dx 06/ 2018   followed by dr Cathie Olden;  per echo 04-22-2018 ef 45-50%, G1DD    Past Surgical History:  Procedure Laterality Date  . COLONOSCOPY  01/25/2014   per Dr. [name], diverticulae only, repeat in 10 yrs   . HAND TENDON SURGERY Right 2014  approx.  [name] HEMICOLECTOMY  2005   resection colon tumor, per pt benign  . LEFT HEART CATH AND  CORONARY ANGIOGRAPHY N/A 01/27/2017   Procedure: LEFT HEART CATH AND CORONARY ANGIOGRAPHY;  Surgeon: Martinique, Peter M, MD;  Location: [place] [facility];  Service: Cardiovascular;  Laterality: N/A;  . ROBOTIC ASSISTED SALPINGO OOPHERECTOMY Bilateral 01/13/2019   Procedure: XI ROBOTIC ASSISTED SALPINGO OOPHORECTOMY;  Surgeon: [name], MD;  Location: [place];  Service: Gynecology;  Laterality: Bilateral;  . TONSILLECTOMY  age 20  . VAGINAL HYSTERECTOMY  1994    There were no vitals filed for this visit.  Subjective Assessment - 01/17/19 0943    Subjective  Pt had ovary removed since last visit.  A little sore but still doing all my HEP.  "I'm ready for discharge."  I have learned a lot about posture, core and a HEP but strength in legs is still not great.  I still get very fatigued in the legs when going up my stairs at home and walking >5-10 min.    How long can you stand comfortably?  5 min    How long can you walk comfortably?  leg weakness with stairs and walking - 15 min walking, 14 steps    Diagnostic tests  MRI 11/2018 multi-level spondylosis, diffuse central and lateral stenosis, advanced facet hypertrophy    Patient Stated Goals  get legs stronger,  keep up with desired level of activity    Currently in Pain?  No/denies         [facility] PT Assessment - 01/17/19 0001      Assessment   Medical Diagnosis  M54.42,M54.41,G89.29 (ICD-10-CM) - Chronic bilateral low back pain with bilateral sciatica    Referring Provider (PT)  [name], MD    Onset Date/Surgical Date  --   fall 2019   Hand Dominance  Right    Next MD Visit  no    Prior Therapy  yes, chiropractic      Balance Screen   Has the patient fallen in the past 6 months  No      Posture/Postural Control   Posture Comments  Improved ability to demo proper trunk alignment and even weight bearing between bil LEs      Strength   Overall Strength Comments  still unable to perform single Rt heel raise,  3-/5 Lt heel raise    Strength Assessment Site  Hip;Knee    Right/Left Hip  Right;Left    Right Hip Flexion  5/5    Right Hip Extension  5/5    Right Hip External Rotation   4-/5    Right Hip Internal Rotation  4/5    Right Hip ABduction  5/5    Right Hip ADduction  4/5    Left Hip Flexion  3/5    Left Hip Extension  5/5    Left Hip External Rotation  5/5    Left Hip Internal Rotation  5/5    Left Hip ABduction  5/5    Left Hip ADduction  5/5    Right/Left Knee  Right;Left    Right Knee Flexion  4/5    Right Knee Extension  4+/5    Left Knee Flexion  4/5    Left Knee Extension  5/5      Flexibility   Hamstrings  WNL    Piriformis  WNL      Ambulation/Gait   Gait Pattern  Step-through pattern;Within Functional Limits    Number of Stairs  14   very fatigued bil LEs   Gait Comments  improved Trendelenburg control      Standardized Balance Assessment   Standardized Balance Assessment  Five Times Sit to Stand    Five times sit to stand comments   8 secs, no UEs, good form, no momentum                   [facility] Adult PT Treatment/Exercise - 01/17/19 0001      Lumbar Exercises: Stretches   Lower Trunk Rotation  5 reps;10 seconds    Pelvic Tilt  20 reps    Piriformis Stretch  Left;Right;2 reps;30 seconds    Piriformis Stretch Limitations  supine    Other Lumbar Stretch Exercise  seated blue ball rollouts flexion and flexion with SB x 10 each direction, PT cued rounding of lumbar spine      Lumbar Exercises: Aerobic   Nustep  seat 7 L3 x 8' legs only, PT present to review LTGs and progress      Lumbar Exercises: Machines for Strengthening   Leg Press  90# x 20 bil LEs, 110# x 20 bil LEs      Lumbar Exercises: Standing   Shoulder Extension  Strengthening;Theraband;20 reps;Both    Theraband Level (Shoulder Extension)  Level 1 (Yellow)    Shoulder Extension Limitations  PT cued standing alingment and core use  Lumbar Exercises: Supine   Clam  1 second;20  reps    Clam Limitations  blue band, PT cued slower motion for control      Knee/Hip Exercises: Standing   Heel Raises  Both;20 reps    Forward Step Up  15 reps;Hand Hold: 1;Both;Step Height: 6"    Forward Step Up Limitations  Pt very fatigued bil LEs      Knee/Hip Exercises: Seated   Sit to Sand  15 reps;without UE support      Shoulder Exercises: Seated   Horizontal ABduction  Strengthening;15 reps;Theraband;Both    Theraband Level (Shoulder Horizontal ABduction)  Level 2 (Red)    Diagonals  Strengthening;Theraband;Both;10 reps    Theraband Level (Shoulder Diagonals)  Level 2 (Red)               PT Short Term Goals - 12/13/18 0944      PT SHORT TERM GOAL #3   Title  Pt will report at least 20% improvement in fatigue when climbing flight of stairs (14 steps) at home secondary to improving strength of bil LEs.    Baseline  in the AM better, not as day progresses    Status  On-going        PT Long Term Goals - 01/17/19 0942      PT LONG TERM GOAL #1   Title  Pt will be ind in advanced HEP and understand how to safely progress.    Status  Achieved      PT LONG TERM GOAL #2   Title  Pt will be able to perform walking/standing activities for at least 30 min before reaching LE fatigue requiring a seated break.    Baseline  played golf for 4 hours x 4 times last week, static standing is more fatiguing    Status  Partially Met      PT LONG TERM GOAL #3   Title  Pt will report at least 70% improvement in fatigue levels with climbing flight of stairs at home (14 steps).    Status  Not Met      PT LONG TERM GOAL #4   Title  Pt will achieve at least 4+/5 in all LE muscle groups to improve tolerance of desired daily tasks and recreation activities including golf, gardening, yardwork.    Baseline  not met for S1 muscle groups    Status  Partially Met      PT LONG TERM GOAL #5   Title  Pt will achieve bil hip ROM to [facility] to reduce strain on lumbar spine.    Status   Achieved            Plan - 01/17/19 1031    Clinical Impression Statement  Pt is ready for discharge with mixed success towards LTGs.  She continues to display chronic weakness in bil S1>L5 muscle groups bil secondary to lumbar stenosis.  She is unable to perform single Rt heel raise which is unchanged from initial evaluation.  She continues to be very fatigued with standing or walking >5-10 min and with ascending her stairs at home (14 stairs).  Strength in other LE muscle groups improved to 4+5/-5/5 and core awareness and stabilization is much improved.  Trendelenburg control during gait is much improved.  She is very compliant with her HEP targeting spine ROM, LE stretching, core stability and LE strength.  PT instructed Pt in upper back and shoulder postural strength with tband today to add to HEP.  PT  recommends f/u with MD regarding ongoing LE fatiguable weakness in S1 muscle groups bil and continuation of HEP with PT f/u as needed.    PT Frequency  2x / week    PT Duration  8 weeks    PT Treatment/Interventions  ADLs/Self Care Home Management;Cryotherapy;Electrical Stimulation;Iontophoresis 33m/ml Dexamethasone;Moist Heat;Traction;Gait training;Stair training;Functional mobility training;Therapeutic activities;Therapeutic exercise;Balance training;Neuromuscular re-education;Patient/family education;Manual techniques;Passive range of motion;Dry needling;Taping;Spinal Manipulations;Joint Manipulations    PT Next Visit Plan  d/c to HEP with f/u prn, recommend f/u with MD regarding S1 muscle group fatiguable weakness    PT Home Exercise Plan  Access Code: [email or web address]   Consulted and Agree with Plan of Care  Patient       Patient will benefit from skilled therapeutic intervention in order to improve the following deficits and impairments:     Visit Diagnosis: Muscle weakness (generalized)  Other abnormalities of gait and mobility  Chronic bilateral low back pain without  sciatica     Problem List Patient Active Problem List   Diagnosis Date Noted  . Abdominal distention 12/26/2018  . Bloating 12/26/2018  . Pancreatic lesion 12/26/2018  . Colon cancer screening 12/26/2018  . Hx of resection of small bowel 12/26/2018  . Palpitations 09/20/2018  . Chronic systolic CHF (congestive heart failure) ([place] 01/27/2017  . Pleural effusion on right 12/03/2016  . CHF (congestive heart failure), NYHA class I, acute, systolic ([place] 049/44/9675 . Acute CHF ([place] 10/26/2016  . Plantar fasciitis, bilateral 03/17/2013  . ANXIETY STATE, UNSPECIFIED 03/27/2009  . Hyperlipidemia 05/04/2007  . Benign essential HTN 05/04/2007  . ALLERGIC RHINITIS 05/04/2007  . Asthma 05/04/2007  . Osteoarthritis 05/04/2007   PHYSICAL THERAPY DISCHARGE SUMMARY  Visits from Start of Care: 10  Current functional level related to goals / functional outcomes: See above   Remaining deficits: Weakness in S1 muscle groups bil with fatiguable weakness, unable to perform Rt heel raise   Education / Equipment: HEP Plan: Patient agrees to discharge.  Patient goals were partially met. Patient is being discharged due to lack of progress.  ?????         JBaruch Merl PT 01/17/19 10:42 AM   [facility] [address] [place] [address]Phone: [phone]  Fax:  [phone] Name: Tina BAHENAMRN: [id number] of Birth: [birth year]

## 2019-01-19 ENCOUNTER — Ambulatory Visit: Payer: PPO | Admitting: Physical Therapy

## 2019-01-24 ENCOUNTER — Encounter: Payer: PPO | Admitting: Physical Therapy

## 2019-01-26 ENCOUNTER — Encounter: Payer: PPO | Admitting: Physical Therapy

## 2019-01-28 ENCOUNTER — Other Ambulatory Visit: Payer: Self-pay

## 2019-01-28 ENCOUNTER — Encounter: Payer: Self-pay | Admitting: Family Medicine

## 2019-01-28 ENCOUNTER — Ambulatory Visit (INDEPENDENT_AMBULATORY_CARE_PROVIDER_SITE_OTHER): Payer: PPO | Admitting: Family Medicine

## 2019-01-28 VITALS — BP 90/78 | HR 72 | Temp 97.8°F | Ht 62.0 in | Wt 167.0 lb

## 2019-01-28 DIAGNOSIS — M545 Low back pain, unspecified: Secondary | ICD-10-CM

## 2019-01-28 DIAGNOSIS — G8929 Other chronic pain: Secondary | ICD-10-CM | POA: Diagnosis not present

## 2019-01-28 DIAGNOSIS — Z23 Encounter for immunization: Secondary | ICD-10-CM

## 2019-01-28 DIAGNOSIS — R29898 Other symptoms and signs involving the musculoskeletal system: Secondary | ICD-10-CM | POA: Diagnosis not present

## 2019-01-28 NOTE — Patient Instructions (Signed)
Health Maintenance Due  Topic Date Due  . Hepatitis C Screening  29-Aug-1945  . PNA vac Low Risk Adult (1 of 2 - PCV13) 08/19/2010  . MAMMOGRAM  04/04/2018  . INFLUENZA VACCINE  12/04/2018    Depression screen [facility] 2/9 02/19/2018 10/20/2016 09/05/2014  Decreased Interest 0 0 0  Down, Depressed, Hopeless 0 0 0  PHQ - 2 Score 0 0 0

## 2019-01-28 NOTE — Progress Notes (Signed)
   Subjective:    Patient ID: Tina May, female    DOB: February 12, 1946, 73 y.o.   MRN: [id number]  HPI Here to discuss chronic low back pain and leg weakness. The leg weakness bothers her more than the pain. No pain in the legs and no numbness or tingling in the legs. She had a lumbar MRI in July showing some disc disease but nothing specific. She has been to PT and she has seen 2 different chiropractors, but nothing has helped. She takes Tramadol as needed. She had normal labs in the past few months including Hgb and TSH.   Review of Systems  Constitutional: Negative.   Respiratory: Negative.   Cardiovascular: Negative.   Musculoskeletal: Positive for back pain.  Neurological: Positive for weakness. Negative for numbness.       Objective:   Physical Exam Constitutional:      Appearance: Normal appearance.  Cardiovascular:     Rate and Rhythm: Normal rate and regular rhythm.     Pulses: Normal pulses.     Heart sounds: Normal heart sounds.  Pulmonary:     Effort: Pulmonary effort is normal.     Breath sounds: Normal breath sounds.  Neurological:     General: No focal deficit present.     Mental Status: She is alert and oriented to person, place, and time.     Motor: No weakness.     Coordination: Coordination normal.     Gait: Gait normal.           Assessment & Plan:  Low back pain and leg weakness, these are suggestive of lumbar spinal stenosis. We will refer her to Neurosurgery to evaluate.  [name], MD

## 2019-01-31 ENCOUNTER — Encounter: Payer: PPO | Admitting: Physical Therapy

## 2019-02-02 ENCOUNTER — Encounter: Payer: PPO | Admitting: Physical Therapy

## 2019-02-09 DIAGNOSIS — Z6830 Body mass index (BMI) 30.0-30.9, adult: Secondary | ICD-10-CM | POA: Diagnosis not present

## 2019-02-09 DIAGNOSIS — M48062 Spinal stenosis, lumbar region with neurogenic claudication: Secondary | ICD-10-CM | POA: Diagnosis not present

## 2019-02-09 DIAGNOSIS — I10 Essential (primary) hypertension: Secondary | ICD-10-CM | POA: Diagnosis not present

## 2019-02-14 ENCOUNTER — Ambulatory Visit: Payer: PPO | Admitting: Cardiovascular Disease

## 2019-02-14 ENCOUNTER — Other Ambulatory Visit: Payer: Self-pay

## 2019-02-14 ENCOUNTER — Encounter: Payer: Self-pay | Admitting: Cardiovascular Disease

## 2019-02-14 VITALS — BP 128/62 | HR 74 | Ht 62.0 in | Wt 165.8 lb

## 2019-02-14 DIAGNOSIS — E785 Hyperlipidemia, unspecified: Secondary | ICD-10-CM

## 2019-02-14 DIAGNOSIS — I10 Essential (primary) hypertension: Secondary | ICD-10-CM | POA: Diagnosis not present

## 2019-02-14 DIAGNOSIS — I2510 Atherosclerotic heart disease of native coronary artery without angina pectoris: Secondary | ICD-10-CM

## 2019-02-14 DIAGNOSIS — I5022 Chronic systolic (congestive) heart failure: Secondary | ICD-10-CM

## 2019-02-14 LAB — HEPATIC FUNCTION PANEL
ALT: 23 IU/L (ref 0–32)
AST: 21 IU/L (ref 0–40)
Albumin: 4.5 g/dL (ref 3.7–4.7)
Alkaline Phosphatase: 61 IU/L (ref 39–117)
Bilirubin Total: 0.3 mg/dL (ref 0.0–1.2)
Bilirubin, Direct: 0.08 mg/dL (ref 0.00–0.40)
Total Protein: 6.6 g/dL (ref 6.0–8.5)

## 2019-02-14 LAB — BASIC METABOLIC PANEL
BUN/Creatinine Ratio: 36 — ABNORMAL HIGH (ref 12–28)
BUN: 32 mg/dL — ABNORMAL HIGH (ref 8–27)
CO2: 24 mmol/L (ref 20–29)
Calcium: 9.8 mg/dL (ref 8.7–10.3)
Chloride: 102 mmol/L (ref 96–106)
Creatinine, Ser: 0.89 mg/dL (ref 0.57–1.00)
GFR calc Af Amer: 74 mL/min/{1.73_m2} (ref >59)
GFR calc non Af Amer: 65 mL/min/{1.73_m2} (ref >59)
Glucose: 107 mg/dL — ABNORMAL HIGH (ref 65–99)
Potassium: 4.8 mmol/L (ref 3.5–5.2)
Sodium: 140 mmol/L (ref 134–144)

## 2019-02-14 LAB — LIPID PANEL
Chol/HDL Ratio: 3.1 ratio (ref 0.0–4.4)
Cholesterol, Total: 192 mg/dL (ref 100–199)
HDL: 61 mg/dL (ref >39)
LDL Chol Calc (NIH): 111 mg/dL — ABNORMAL HIGH (ref 0–99)
Triglycerides: 114 mg/dL (ref 0–149)
VLDL Cholesterol Cal: 20 mg/dL (ref 5–40)

## 2019-02-14 NOTE — Progress Notes (Signed)
Cardiology Office Note:    Date:  02/14/2019   ID:  Tina, May Sep 16, 1945, MRN [id number]  PCP:  [name], MD  Cardiologist:  Mertie Moores, MD    Referring MD: [name], MD   Problem List 1. Chronic systolic CHF  2. Essential HTN 3. Hyperlipidemia 4. COPD  5. Pancreatic lesion:  No chief complaint on file.   Tina May is a 73 y.o. female with a hx of chronic systolic CHF in June, [id number].  I saw her in consultation while in the hospital .  EF by echo at that time was 25-30%.Her last week and is unsure and it looks her heart function is improving as we started at the 25-30 range 45 kg  She has been on Entresto 24-26 mg BID Coreg 6.25 BID Lasix 40 mg a day   Feeling much better Cath 01/27/17 showed mild RCA 10% stenosis - otherwise normal coronaries.  Feeling great. Tolerating the Entresto  No shortness of breath   Aug. 30, 2019:  Tina May is seen today for follow-up of her congestive heart failure.  She is done very well.  She is on Entresto 97-103 mg a day.  She is tolerating it quite well. Exercising regularly  - exercise capacity is much better.   Had a great trip to [place] recently  - fished, dog sledded, rode in a helicopter.   November 12, 2018   Continues to travel.   [place] to [place] this past [place].   [place] cruise, traveled to [place].   Several months ago ,  Had tachypalps .  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 Was started on amlodipine ( caused upset stomach, diarrhea )  Has continued to have GI problems.   She thinks it her carvedilol ( process of elimination )  She did a [facility] screening test.  She was found to have very mild right carotid artery disease.  Her ankle-brachial indexes were normal.  Ultrasound of the aorta was normal.  BMI was 32.  She needs to have GYN surgery ,   Needs oopherectomy. Tina May No CP , no dypnsea She is at low risk for her upcoming GYN surgery .   February 14, 2019: She is seen today for follow-up of her  chronic systolic congestive heart failure.  She has not tolerated carvedilol so we changed to Toprol-XL.  The Toprol caused similar GI upset.  Changed to Bisoprolol  breathing is great.   Is exercising .   Plays golf 3 times a week.   Rowing [place],  [place], yard work .    Past Medical History:  Diagnosis Date  . Arthritis   . Complex cyst of right ovary   . Diverticulosis   . External hemorrhoids   . History of basal cell carcinoma (BCC) excision   . Hyperlipidemia   . Hypertension    cardiologist-- dr Cathie Olden;  per cardiac cath 01-27-2017  no sig. cad,  10% pRCA to mRCA  . Intermittent palpitations (01-12-2019 per denies any palpitations)   event monitor 10-27-2018 (dr Cathie Olden)  SR including ST and SB with two episodes NSVT  . Mild asthma   . NICM (nonischemic cardiomyopathy) ([place]) followed by dr Cathie Olden   dx 06/ 2018  ef 25-30%;   stress echo 12-22-2016 no ischemia, ef 30-35%;   last echo 04-22-2018 ef 45-50%  . Pancreatic lesion 03/11/2013   last CT abd. 10-27-2018, stable  . Systolic CHF, chronic ([name]) dx 06/ 2018  followed by dr Cathie Olden;  per echo 04-22-2018 ef 45-50%, G1DD    Past Surgical History:  Procedure Laterality Date  . COLONOSCOPY  01/25/2014   per Dr. [name], diverticulae only, repeat in 10 yrs   . HAND TENDON SURGERY Right 2014  approx.  Tina May HEMICOLECTOMY  2005   resection colon tumor, per pt benign  . LEFT HEART CATH AND CORONARY ANGIOGRAPHY N/A 01/27/2017   Procedure: LEFT HEART CATH AND CORONARY ANGIOGRAPHY;  Surgeon: Martinique, Peter M, MD;  Location: [place] [facility];  Service: Cardiovascular;  Laterality: N/A;  . ROBOTIC ASSISTED SALPINGO OOPHERECTOMY Bilateral 01/13/2019   Procedure: XI ROBOTIC ASSISTED SALPINGO OOPHORECTOMY;  Surgeon: [name], MD;  Location: [place];  Service: Gynecology;  Laterality: Bilateral;  . TONSILLECTOMY  age 76  . VAGINAL HYSTERECTOMY  1994    Current Medications: Current Meds  Medication Sig  . aspirin  EC 81 MG tablet Take 81 mg by mouth daily.  . bisoprolol (ZEBETA) 5 MG tablet Take 1 tablet (5 mg total) by mouth daily.  Tina May ENTRESTO 97-103 MG TAKE 1 TABLET BY MOUTH TWICE A DAY  . estradiol (ESTRACE) 0.5 MG tablet Take 0.5 mg by mouth once a week. For Menopausal hot flashes  . FIBER SELECT GUMMIES CHEW Chew 2 tablets by mouth daily.  Tina May Hyaluronic Acid-Vitamin C (HYALURONIC ACID PO) Take 1 tablet by mouth daily. Women's Hyaluronic Acid  . Misc Natural Products (TART CHERRY ADVANCED PO) Take 60 mLs by mouth at bedtime.  . Multiple Vitamin (MULTIVITAMIN WITH MINERALS) TABS tablet Take 1 tablet by mouth daily. One-A-Day  . PROAIR HFA 108 (90 Base) MCG/ACT inhaler INHALE 2 PUFFS INTO THE LUNGS EVERY 4 (FOUR) HOURS AS NEEDED FOR WHEEZING OR SHORTNESS OF BREATH.  . RESTASIS 0.05 % ophthalmic emulsion Place 1 drop into both eyes 2 (two) times daily.   . rosuvastatin (CRESTOR) 5 MG tablet TAKE 1 TABLET (5 MG TOTAL) BY MOUTH DAILY.  Tina May spironolactone (ALDACTONE) 25 MG tablet Take 1 tablet (25 mg total) by mouth daily.  . TURMERIC CURCUMIN PO Take 1,000 mg by mouth daily.     Allergies:   Amlodipine, Atorvastatin, Diclofenac, and Morphine and related   Social History   Socioeconomic History  . Marital status: Widowed    Spouse name: Not on file  . Number of children: 3  . Years of education: Not on file  . Highest education level: Not on file  Occupational History  . Occupation: Retired  Scientific laboratory technician  . Financial resource strain: Not on file  . Food insecurity    Worry: Not on file    Inability: Not on file  . Transportation needs    Medical: Not on file    Non-medical: Not on file  Tobacco Use  . Smoking status: Former Smoker    Packs/day: 1.00    Years: 20.00    Pack years: 20.00    Types: Cigarettes    Quit date: 05/06/1991    Years since quitting: 27.7  . Smokeless tobacco: Never Used  Substance and Sexual Activity  . Alcohol use: Yes    Alcohol/week: 8.0 standard drinks     Types: 7 Glasses of wine, 1 Standard drinks or equivalent per week  . Drug use: Never  . Sexual activity: Yes    Birth control/protection: Surgical  Lifestyle  . Physical activity    Days per week: Not on file    Minutes per session: Not on file  . Stress: Not  on file  Relationships  . Social Herbalist on phone: Not on file    Gets together: Not on file    Attends religious service: Not on file    Active member of club or organization: Not on file    Attends meetings of clubs or organizations: Not on file    Relationship status: Not on file  Other Topics Concern  . Not on file  Social History Narrative  . Not on file     Family History: The patient's family history includes Healthy in her brother and sister; Hyperlipidemia in an other family member; Hypertension in an other family member; Prostate cancer in an other family member. There is no history of Colon cancer, Esophageal cancer, Inflammatory bowel disease, Liver disease, Pancreatic cancer, Rectal cancer, or Stomach cancer. ROS:   Please see the history of present illness.     All other systems reviewed and are negative.  EKGs/Labs/Other Studies Reviewed:    The following studies were reviewed today:   EKG:   Recent Labs: 09/20/2018: Magnesium 2.1; TSH 0.804 10/27/2018: ALT 27 01/11/2019: BUN 33; Creatinine, Ser 0.85; Hemoglobin 13.4; Platelets 234; Potassium 4.0; Sodium 142  Recent Lipid Panel    Component Value Date/Time   CHOL 210 (H) 04/19/2018 0846   TRIG 152 (H) 04/19/2018 0846   HDL 63 04/19/2018 0846   CHOLHDL 3.3 04/19/2018 0846   CHOLHDL 4 09/11/2017 0814   VLDL 40.8 (H) 09/11/2017 0814   LDLCALC 117 (H) 04/19/2018 0846   LDLDIRECT 113.0 09/11/2017 0814    Physical Exam: Blood pressure 128/62, pulse 74, height 5\' 2"  (1.575 m), weight 165 lb 12.8 oz (75.2 kg), SpO2 98 %.  GEN:  Well nourished, well developed in no acute distress HEENT: Normal NECK: No JVD; No carotid bruits LYMPHATICS:  No lymphadenopathy CARDIAC: RRR , no murmurs, rubs, gallops RESPIRATORY:  Clear to auscultation without rales, wheezing or rhonchi  ABDOMEN: Soft, non-tender, non-distended MUSCULOSKELETAL:  No edema; No deformity  SKIN: Warm and dry NEUROLOGIC:  Alert and oriented x 3   ASSESSMENT:    1. Chronic systolic CHF (congestive heart failure) ([place])   2. Hyperlipidemia, unspecified hyperlipidemia type   3. Coronary artery disease involving native coronary artery of native heart without angina pectoris    PLAN:    In order of problems listed above:  1.  Chronic Systolic CHF:    [place] is doing great.  Continue bisoprolol, Entresto, spironolactone She is not having any symptoms.  She exercises on a daily basis.  Her blood pressure and heart rate are well controlled.     2. Hyperlipidemia:    Check labs today .  Continue rosuvastatin     Medication Adjustments/Labs and Tests Ordered: Current medicines are reviewed at length with the patient today.  Concerns regarding medicines are outlined above.  Orders Placed This Encounter  Procedures  . Lipid Profile  . Basic Metabolic Panel (BMET)  . Hepatic function panel   No orders of the defined types were placed in this encounter.   Signed, Mertie Moores, MD  02/14/2019 9:41 AM    [place]

## 2019-02-14 NOTE — Patient Instructions (Signed)
Medication Instructions:  Your physician recommends that you continue on your current medications as directed. Please refer to the Current Medication list given to you today.  If you need a refill on your cardiac medications before your next appointment, please call your pharmacy.    Lab work: TODAY - cholesterol, liver panel, basic metabolic panel  If you have labs (blood work) drawn today and your tests are completely normal, you will receive your results only by: . MyChart Message (if you have MyChart) OR . A paper copy in the mail If you have any lab test that is abnormal or we need to change your treatment, we will call you to review the results.   Testing/Procedures: None Ordered   Follow-Up: At [facility], you and your health needs are our priority.  As part of our continuing mission to provide you with exceptional heart care, we have created designated Provider Care Teams.  These Care Teams include your primary Cardiologist (physician) and Advanced Practice Providers (APPs -  Physician Assistants and Nurse Practitioners) who all work together to provide you with the care you need, when you need it. You will need a follow up appointment in:  6 months.  Please call our office 2 months in advance to schedule this appointment.  You may see Philip Nahser, MD or one of the following Advanced Practice Providers on your designated Care Team: Scott Weaver, PA-C Vin Bhagat, PA-C . Janine Hammond, NP   

## 2019-03-01 DIAGNOSIS — M48062 Spinal stenosis, lumbar region with neurogenic claudication: Secondary | ICD-10-CM | POA: Diagnosis not present

## 2019-03-26 DIAGNOSIS — Z03818 Encounter for observation for suspected exposure to other biological agents ruled out: Secondary | ICD-10-CM | POA: Diagnosis not present

## 2019-03-26 DIAGNOSIS — J028 Acute pharyngitis due to other specified organisms: Secondary | ICD-10-CM | POA: Diagnosis not present

## 2019-04-07 ENCOUNTER — Ambulatory Visit (INDEPENDENT_AMBULATORY_CARE_PROVIDER_SITE_OTHER): Payer: PPO | Admitting: Family Medicine

## 2019-04-07 ENCOUNTER — Encounter: Payer: Self-pay | Admitting: Family Medicine

## 2019-04-07 ENCOUNTER — Other Ambulatory Visit: Payer: Self-pay

## 2019-04-07 DIAGNOSIS — R21 Rash and other nonspecific skin eruption: Secondary | ICD-10-CM | POA: Diagnosis not present

## 2019-04-07 DIAGNOSIS — J3089 Other allergic rhinitis: Secondary | ICD-10-CM

## 2019-04-07 NOTE — Progress Notes (Signed)
Subjective:    Patient ID: Tina May, female    DOB: 1946-01-17, 73 y.o.   MRN: [id number]  HPI Virtual Visit via Video Note  I connected with the patient on 04/07/19 at 10:15 AM EST by a video enabled telemedicine application and verified that I am speaking with the correct person using two identifiers.  Location patient: home Location provider:work or home office Persons participating in the virtual visit: patient, provider  I discussed the limitations of evaluation and management by telemedicine and the availability of in person appointments. The patient expressed understanding and agreed to proceed.   HPI: Here for 2 issues. First about 4 weeks ago she began to notice patches of red bruising on her forearms every time she did yard work that lasted about a week and then faded away. These areas are not painful or itchy. No other areas of her skin are involved. Of note she has taken an 81 mg aspirin daily for years, and then on 03-01-19 she had an epidural steroid injection to the back. The rashes first appeared several days after that. The second issue is a mild recurrent ST that started one month ago. She has some PND with this but no headache or fever or cough or SOB. No body aches or NVD. She has tried Claritin with no relief. Of note she went to an urgent care on 03-29-19 for a Covid-19 test which was was negative.    ROS: See pertinent positives and negatives per HPI.  Past Medical History:  Diagnosis Date  . Arthritis   . Complex cyst of right ovary   . Diverticulosis   . External hemorrhoids   . History of basal cell carcinoma (BCC) excision   . Hyperlipidemia   . Hypertension    cardiologist-- dr Cathie Olden;  per cardiac cath 01-27-2017  no sig. cad,  10% pRCA to mRCA  . Intermittent palpitations (01-12-2019 per denies any palpitations)   event monitor 10-27-2018 (dr Cathie Olden)  SR including ST and SB with two episodes NSVT  . Mild asthma   . NICM (nonischemic  cardiomyopathy) ([place]) followed by dr Cathie Olden   dx 06/ 2018  ef 25-30%;   stress echo 12-22-2016 no ischemia, ef 30-35%;   last echo 04-22-2018 ef 45-50%  . Pancreatic lesion 03/11/2013   last CT abd. 10-27-2018, stable  . Systolic CHF, chronic ([place]) dx 06/ 2018   followed by dr Cathie Olden;  per echo 04-22-2018 ef 45-50%, G1DD    Past Surgical History:  Procedure Laterality Date  . COLONOSCOPY  01/25/2014   per Dr. [name], diverticulae only, repeat in 10 yrs   . HAND TENDON SURGERY Right 2014  approx.  [name] HEMICOLECTOMY  2005   resection colon tumor, per pt benign  . LEFT HEART CATH AND CORONARY ANGIOGRAPHY N/A 01/27/2017   Procedure: LEFT HEART CATH AND CORONARY ANGIOGRAPHY;  Surgeon: Martinique, Peter M, MD;  Location: [facility];  Service: Cardiovascular;  Laterality: N/A;  . ROBOTIC ASSISTED SALPINGO OOPHERECTOMY Bilateral 01/13/2019   Procedure: XI ROBOTIC ASSISTED SALPINGO OOPHORECTOMY;  Surgeon: [name], MD;  Location: [facility];  Service: Gynecology;  Laterality: Bilateral;  . TONSILLECTOMY  age 66  . VAGINAL HYSTERECTOMY  1994    Family History  Problem Relation Age of Onset  . Hyperlipidemia Other   . Hypertension Other   . Prostate cancer Other   . Healthy Sister   . Healthy Brother   . Colon cancer Neg Hx   .  Esophageal cancer Neg Hx   . Inflammatory bowel disease Neg Hx   . Liver disease Neg Hx   . Pancreatic cancer Neg Hx   . Rectal cancer Neg Hx   . Stomach cancer Neg Hx      Current Outpatient Medications:  .  aspirin EC 81 MG tablet, Take 81 mg by mouth daily., Disp: , Rfl:  .  bisoprolol (ZEBETA) 5 MG tablet, Take 1 tablet (5 mg total) by mouth daily., Disp: 30 tablet, Rfl: 11 .  ENTRESTO 97-103 MG, TAKE 1 TABLET BY MOUTH TWICE A DAY, Disp: 60 tablet, Rfl: 6 .  estradiol (ESTRACE) 0.5 MG tablet, Take 0.5 mg by mouth once a week. For Menopausal hot flashes, Disp: , Rfl:  .  FIBER SELECT GUMMIES CHEW, Chew 2 tablets by mouth daily., Disp:  , Rfl:  .  Hyaluronic Acid-Vitamin C (HYALURONIC ACID PO), Take 1 tablet by mouth daily. Women's Hyaluronic Acid, Disp: , Rfl:  .  Misc Natural Products (TART CHERRY ADVANCED PO), Take 60 mLs by mouth at bedtime., Disp: , Rfl:  .  Multiple Vitamin (MULTIVITAMIN WITH MINERALS) TABS tablet, Take 1 tablet by mouth daily. One-A-Day, Disp: , Rfl:  .  PROAIR HFA 108 (90 Base) MCG/ACT inhaler, INHALE 2 PUFFS INTO THE LUNGS EVERY 4 (FOUR) HOURS AS NEEDED FOR WHEEZING OR SHORTNESS OF BREATH., Disp: 25.5 Inhaler, Rfl: 3 .  RESTASIS 0.05 % ophthalmic emulsion, Place 1 drop into both eyes 2 (two) times daily. , Disp: , Rfl:  .  rosuvastatin (CRESTOR) 5 MG tablet, TAKE 1 TABLET (5 MG TOTAL) BY MOUTH DAILY., Disp: 90 tablet, Rfl: 3 .  spironolactone (ALDACTONE) 25 MG tablet, Take 1 tablet (25 mg total) by mouth daily., Disp: 90 tablet, Rfl: 3 .  TURMERIC CURCUMIN PO, Take 1,000 mg by mouth daily., Disp: , Rfl:   EXAM:  VITALS per patient if applicable:  GENERAL: alert, oriented, appears well and in no acute distress  HEENT: atraumatic, conjunttiva clear, no obvious abnormalities on inspection of external nose and ears  NECK: normal movements of the head and neck  LUNGS: on inspection no signs of respiratory distress, breathing rate appears normal, no obvious gross SOB, gasping or wheezing  CV: no obvious cyanosis  MS: moves all visible extremities without noticeable abnormality  PSYCH/NEURO: pleasant and cooperative, no obvious depression or anxiety, speech and thought processing grossly intact  ASSESSMENT AND PLAN: I think she has 2 different issues. The rash on her forearms is simply due to the combined anticoagulant effects of steroids and aspirin, and this benign. I advised her to stop the aspirin and she agreed. The ST is likely due to allergies, and she can try taking Xyzal daily. Recheck prn.  [name], MD  Discussed the following assessment and plan:  No diagnosis found.     I  discussed the assessment and treatment plan with the patient. The patient was provided an opportunity to ask questions and all were answered. The patient agreed with the plan and demonstrated an understanding of the instructions.   The patient was advised to call back or seek an in-person evaluation if the symptoms worsen or if the condition fails to improve as anticipated.     Review of Systems     Objective:   Physical Exam        Assessment & Plan:

## 2019-04-18 DIAGNOSIS — M48062 Spinal stenosis, lumbar region with neurogenic claudication: Secondary | ICD-10-CM | POA: Diagnosis not present

## 2019-04-18 DIAGNOSIS — Z6830 Body mass index (BMI) 30.0-30.9, adult: Secondary | ICD-10-CM | POA: Diagnosis not present

## 2019-04-18 DIAGNOSIS — I10 Essential (primary) hypertension: Secondary | ICD-10-CM | POA: Diagnosis not present

## 2019-04-19 ENCOUNTER — Other Ambulatory Visit: Payer: Self-pay | Admitting: Cardiovascular Disease

## 2019-05-13 DIAGNOSIS — M9905 Segmental and somatic dysfunction of pelvic region: Secondary | ICD-10-CM | POA: Diagnosis not present

## 2019-05-13 DIAGNOSIS — M9901 Segmental and somatic dysfunction of cervical region: Secondary | ICD-10-CM | POA: Diagnosis not present

## 2019-05-13 DIAGNOSIS — M9902 Segmental and somatic dysfunction of thoracic region: Secondary | ICD-10-CM | POA: Diagnosis not present

## 2019-05-13 DIAGNOSIS — M9903 Segmental and somatic dysfunction of lumbar region: Secondary | ICD-10-CM | POA: Diagnosis not present

## 2019-05-20 DIAGNOSIS — M9902 Segmental and somatic dysfunction of thoracic region: Secondary | ICD-10-CM | POA: Diagnosis not present

## 2019-05-20 DIAGNOSIS — M9903 Segmental and somatic dysfunction of lumbar region: Secondary | ICD-10-CM | POA: Diagnosis not present

## 2019-05-20 DIAGNOSIS — M9901 Segmental and somatic dysfunction of cervical region: Secondary | ICD-10-CM | POA: Diagnosis not present

## 2019-05-20 DIAGNOSIS — M9905 Segmental and somatic dysfunction of pelvic region: Secondary | ICD-10-CM | POA: Diagnosis not present

## 2019-05-25 DIAGNOSIS — M9903 Segmental and somatic dysfunction of lumbar region: Secondary | ICD-10-CM | POA: Diagnosis not present

## 2019-05-25 DIAGNOSIS — M9902 Segmental and somatic dysfunction of thoracic region: Secondary | ICD-10-CM | POA: Diagnosis not present

## 2019-05-25 DIAGNOSIS — M9901 Segmental and somatic dysfunction of cervical region: Secondary | ICD-10-CM | POA: Diagnosis not present

## 2019-05-25 DIAGNOSIS — M9905 Segmental and somatic dysfunction of pelvic region: Secondary | ICD-10-CM | POA: Diagnosis not present

## 2019-06-01 DIAGNOSIS — M9902 Segmental and somatic dysfunction of thoracic region: Secondary | ICD-10-CM | POA: Diagnosis not present

## 2019-06-01 DIAGNOSIS — M9903 Segmental and somatic dysfunction of lumbar region: Secondary | ICD-10-CM | POA: Diagnosis not present

## 2019-06-01 DIAGNOSIS — M9901 Segmental and somatic dysfunction of cervical region: Secondary | ICD-10-CM | POA: Diagnosis not present

## 2019-06-01 DIAGNOSIS — M9905 Segmental and somatic dysfunction of pelvic region: Secondary | ICD-10-CM | POA: Diagnosis not present

## 2019-06-08 DIAGNOSIS — M9903 Segmental and somatic dysfunction of lumbar region: Secondary | ICD-10-CM | POA: Diagnosis not present

## 2019-06-08 DIAGNOSIS — M9902 Segmental and somatic dysfunction of thoracic region: Secondary | ICD-10-CM | POA: Diagnosis not present

## 2019-06-08 DIAGNOSIS — M9901 Segmental and somatic dysfunction of cervical region: Secondary | ICD-10-CM | POA: Diagnosis not present

## 2019-06-08 DIAGNOSIS — M9905 Segmental and somatic dysfunction of pelvic region: Secondary | ICD-10-CM | POA: Diagnosis not present

## 2019-06-15 DIAGNOSIS — M9901 Segmental and somatic dysfunction of cervical region: Secondary | ICD-10-CM | POA: Diagnosis not present

## 2019-06-15 DIAGNOSIS — M9905 Segmental and somatic dysfunction of pelvic region: Secondary | ICD-10-CM | POA: Diagnosis not present

## 2019-06-15 DIAGNOSIS — M9903 Segmental and somatic dysfunction of lumbar region: Secondary | ICD-10-CM | POA: Diagnosis not present

## 2019-06-15 DIAGNOSIS — M9902 Segmental and somatic dysfunction of thoracic region: Secondary | ICD-10-CM | POA: Diagnosis not present

## 2019-06-21 ENCOUNTER — Telehealth: Payer: Self-pay | Admitting: Family Medicine

## 2019-06-21 NOTE — Progress Notes (Signed)
  Chronic Care Management   Outreach Note  06/21/2019 Name: Tina May MRN: [id number] DOB: Sep 08, 1945  Referred by: [name], MD Reason for referral : No chief complaint on file.   An unsuccessful telephone outreach was attempted today. The patient was referred to the pharmacist for assistance with care management and care coordination.   Follow Up Plan:   [name] [employer] Scheduler

## 2019-06-22 DIAGNOSIS — M9902 Segmental and somatic dysfunction of thoracic region: Secondary | ICD-10-CM | POA: Diagnosis not present

## 2019-06-22 DIAGNOSIS — M9901 Segmental and somatic dysfunction of cervical region: Secondary | ICD-10-CM | POA: Diagnosis not present

## 2019-06-22 DIAGNOSIS — M9905 Segmental and somatic dysfunction of pelvic region: Secondary | ICD-10-CM | POA: Diagnosis not present

## 2019-06-22 DIAGNOSIS — M9903 Segmental and somatic dysfunction of lumbar region: Secondary | ICD-10-CM | POA: Diagnosis not present

## 2019-06-24 DIAGNOSIS — H1045 Other chronic allergic conjunctivitis: Secondary | ICD-10-CM | POA: Diagnosis not present

## 2019-06-29 DIAGNOSIS — M9903 Segmental and somatic dysfunction of lumbar region: Secondary | ICD-10-CM | POA: Diagnosis not present

## 2019-06-29 DIAGNOSIS — M9905 Segmental and somatic dysfunction of pelvic region: Secondary | ICD-10-CM | POA: Diagnosis not present

## 2019-06-29 DIAGNOSIS — M9902 Segmental and somatic dysfunction of thoracic region: Secondary | ICD-10-CM | POA: Diagnosis not present

## 2019-06-29 DIAGNOSIS — M9901 Segmental and somatic dysfunction of cervical region: Secondary | ICD-10-CM | POA: Diagnosis not present

## 2019-07-04 DIAGNOSIS — H1045 Other chronic allergic conjunctivitis: Secondary | ICD-10-CM | POA: Diagnosis not present

## 2019-07-06 DIAGNOSIS — M9903 Segmental and somatic dysfunction of lumbar region: Secondary | ICD-10-CM | POA: Diagnosis not present

## 2019-07-06 DIAGNOSIS — M9905 Segmental and somatic dysfunction of pelvic region: Secondary | ICD-10-CM | POA: Diagnosis not present

## 2019-07-06 DIAGNOSIS — M9902 Segmental and somatic dysfunction of thoracic region: Secondary | ICD-10-CM | POA: Diagnosis not present

## 2019-07-06 DIAGNOSIS — M9901 Segmental and somatic dysfunction of cervical region: Secondary | ICD-10-CM | POA: Diagnosis not present

## 2019-07-13 ENCOUNTER — Other Ambulatory Visit: Payer: Self-pay | Admitting: Family Medicine

## 2019-07-13 ENCOUNTER — Telehealth: Payer: Self-pay | Admitting: Family Medicine

## 2019-07-13 DIAGNOSIS — M9903 Segmental and somatic dysfunction of lumbar region: Secondary | ICD-10-CM | POA: Diagnosis not present

## 2019-07-13 DIAGNOSIS — M9905 Segmental and somatic dysfunction of pelvic region: Secondary | ICD-10-CM | POA: Diagnosis not present

## 2019-07-13 DIAGNOSIS — M9902 Segmental and somatic dysfunction of thoracic region: Secondary | ICD-10-CM | POA: Diagnosis not present

## 2019-07-13 DIAGNOSIS — M9901 Segmental and somatic dysfunction of cervical region: Secondary | ICD-10-CM | POA: Diagnosis not present

## 2019-07-13 NOTE — Chronic Care Management (AMB) (Signed)
  Chronic Care Management   Note  07/13/2019 Name: Tina May MRN: [id number] DOB: 05-21-1945  Tina May is a 74 y.o. year old female who is a primary care patient of [name], MD. I reached out to Yong Channel by phone today in response to a referral sent by Ms. [name]'s PCP, [name], MD.   [name] was given information about Chronic Care Management services today including:  1. CCM service includes personalized support from designated clinical staff supervised by her physician, including individualized plan of care and coordination with other care providers 2. 24/7 contact phone numbers for assistance for urgent and routine care needs. 3. Service will only be billed when office clinical staff spend 20 minutes or more in a month to coordinate care. 4. Only one practitioner may furnish and bill the service in a calendar month. 5. The patient may stop CCM services at any time (effective at the end of the month) by phone call to the office staff.   Patient agreed to services and verbal consent obtained.   Follow up plan:   [name] [employer] Scheduler

## 2019-07-17 IMAGING — MR MRI LUMBAR SPINE WITHOUT CONTRAST
5 series · 42 of 48 positions shown · non-contrast
Comparison: None.

CLINICAL DATA: Radiculopathy for greater than 6 weeks with
conservative therapy. Persistent symptoms. Low back pain and
weakness in the legs for 6 months.

EXAM:
MRI LUMBAR SPINE WITHOUT CONTRAST
TECHNIQUE: Multiplanar, multisequence MR imaging of the lumbar spine was
performed. No intravenous contrast was administered.

[Series 3: tirm sag · sagittal · 4.0mm · 0.55mm/px · 6 of 13 slices shown]
[im 1/13]
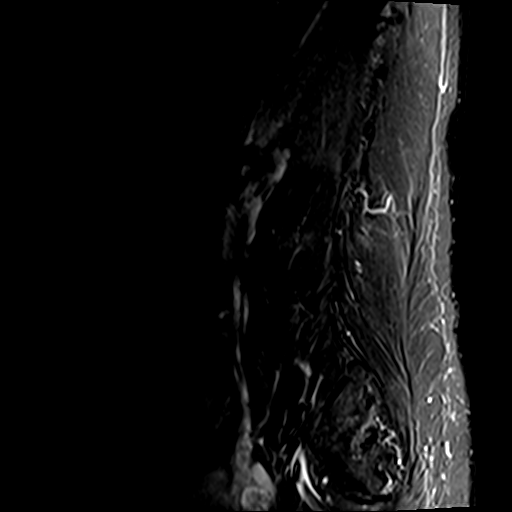
[im 3/13]
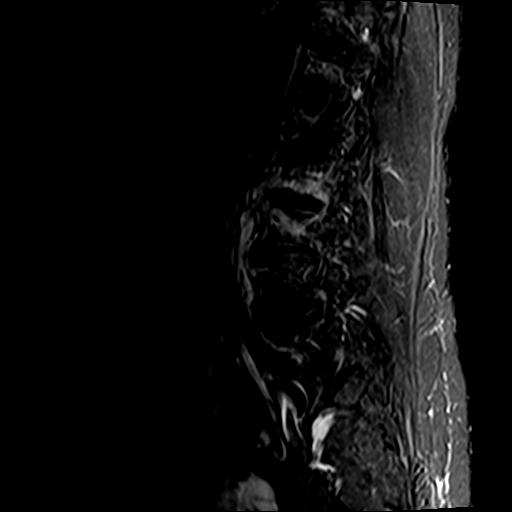
[im 5/13]
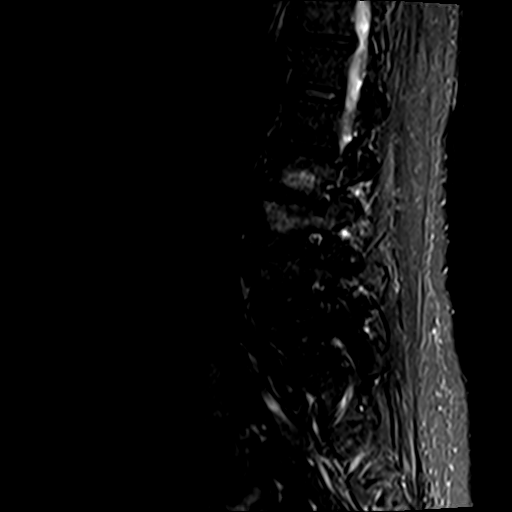
[im 8/13]
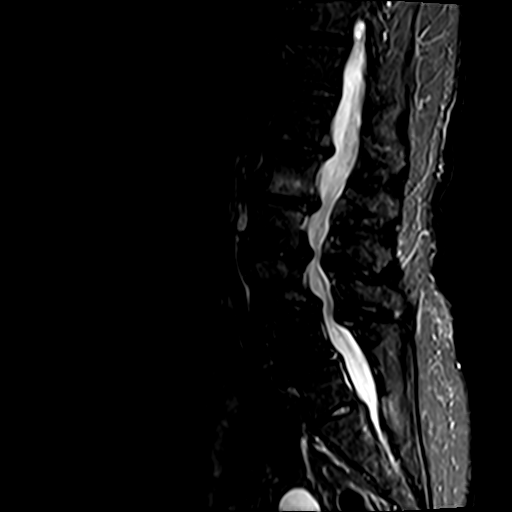
[im 10/13]
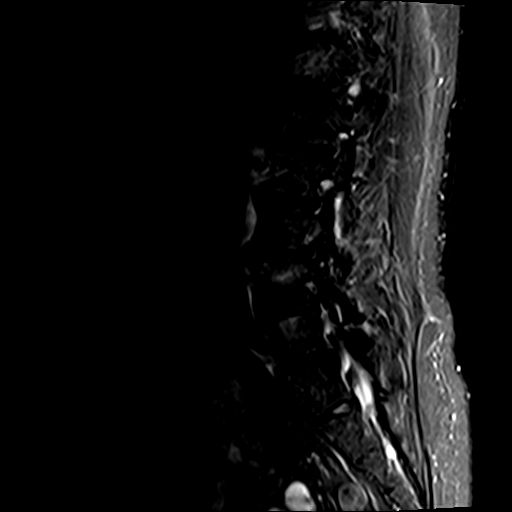
[im 13/13]
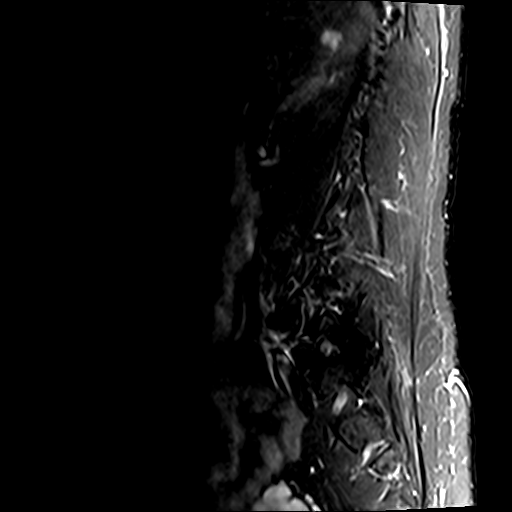

[Series 4: T2 · sagittal · 4.0mm · 0.88mm/px · 5 of 13 slices shown (1 of 2)]
[im 1/13]
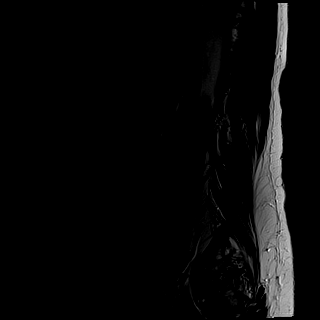
[im 4/13]
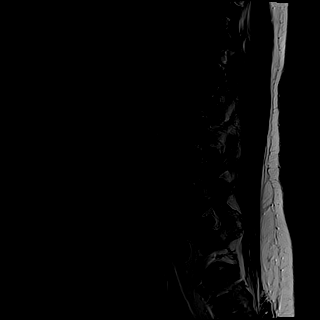
[im 7/13]
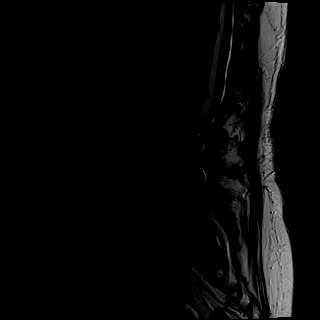
[im 10/13]
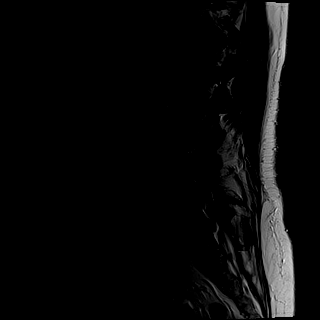
[im 13/13]
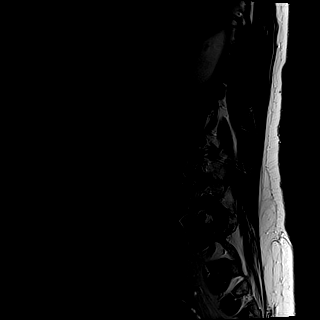

[Series 5: T1 · sagittal · 4.0mm · 0.88mm/px · 5 of 13 slices shown (1 of 2)]
[im 1/13]
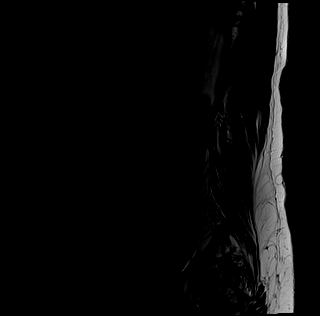
[im 4/13]
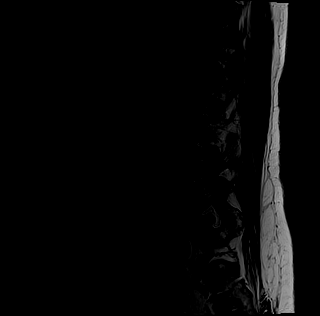
[im 7/13]
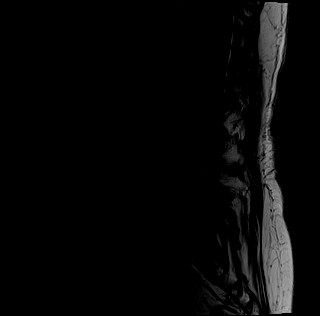
[im 10/13]
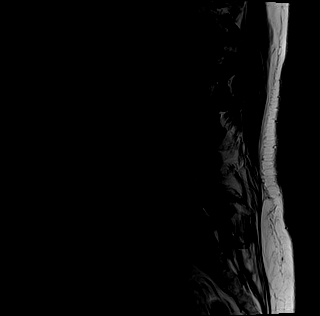
[im 13/13]
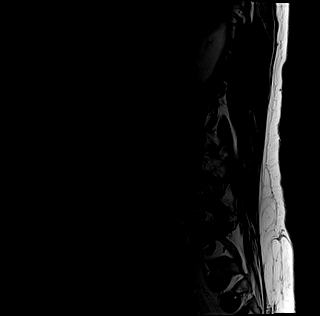

[Series 6: T1 · axial · 4.0mm · 0.78mm/px · z∈[-27,+171]mm · 10 of 38 slices shown (2 of 2)]
[im 3/38]
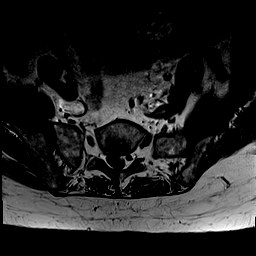
[im 5/38]
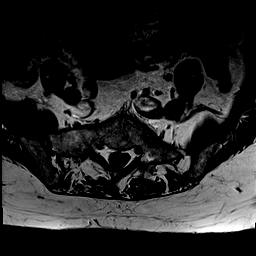
[im 8/38]
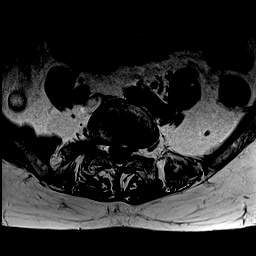
[im 13/38]
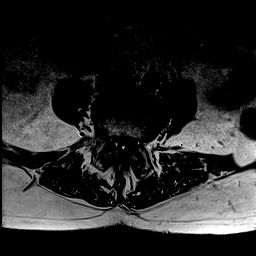
[im 18/38]
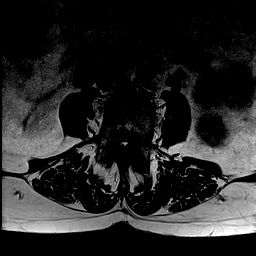
[im 20/38]
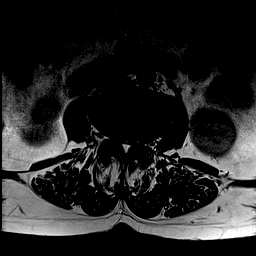
[im 23/38]
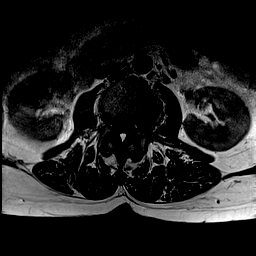
[im 28/38]
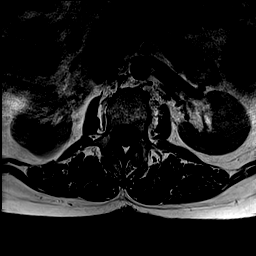
[im 33/38]
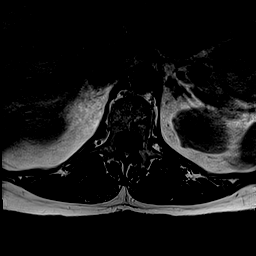
[im 38/38]
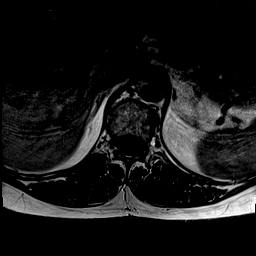

[Series 7: T2 · axial · 4.0mm · 0.78mm/px · z∈[-37,+171]mm · 16 of 38 slices shown (2 of 2)]
[im 1/38]
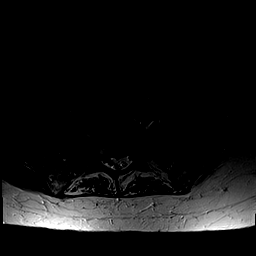
[im 3/38]
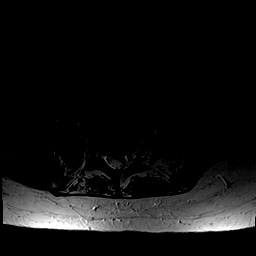
[im 5/38]
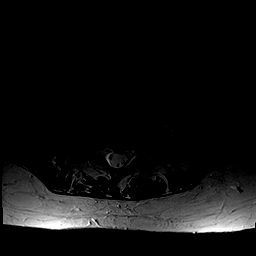
[im 8/38]
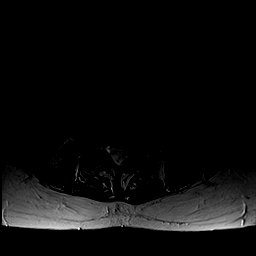
[im 10/38]
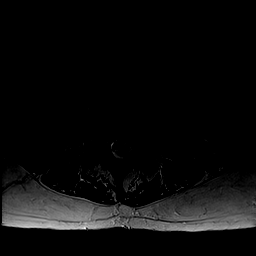
[im 13/38]
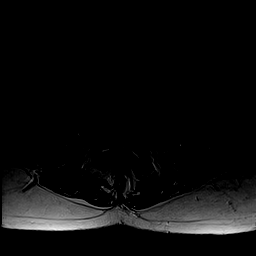
[im 15/38]
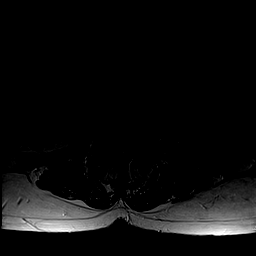
[im 18/38]
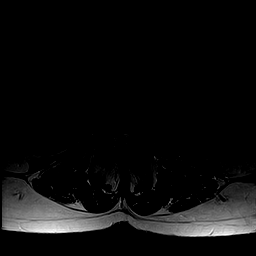
[im 20/38]
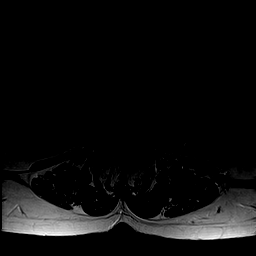
[im 23/38]
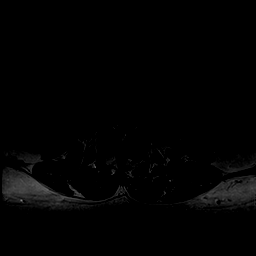
[im 25/38]
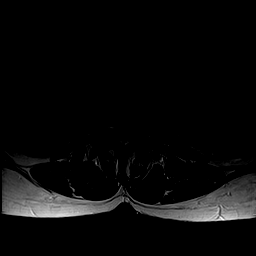
[im 28/38]
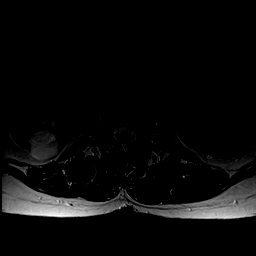
[im 30/38]
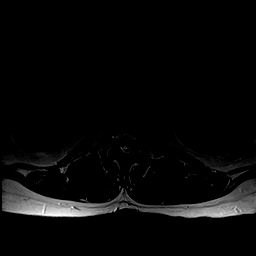
[im 33/38]
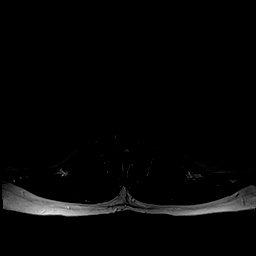
[im 35/38]
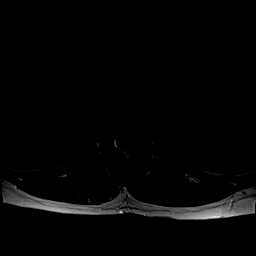
[im 38/38]
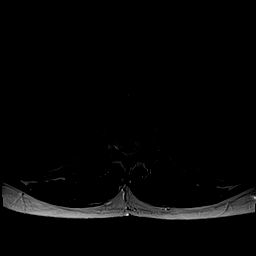

[42 of 48 positions shown; findings below may reference images not displayed]

FINDINGS: Segmentation: Transitional S1 segment is present. Five non
rib-bearing lumbar type vertebral bodies are present.

Alignment: Slight retrolisthesis is present at L1-2 and L2-3. There
is grade 1 anterolisthesis at L4-5. Dextroconvex curvature of the
lumbar spine is centered at L2-3.

Vertebrae: Chronic endplate marrow changes present on the left L2-3
with edematous changes. Edematous changes are worse on the right at
L3-4. Chronic fatty endplate marrow changes are present at L1-2,
left greater than right. Edematous changes are worse on the left at
L2-3. Vertebral body heights are maintained.

Conus medullaris and cauda equina: Conus extends to the L1-2 level.
Conus and cauda equina appear normal.

Paraspinal and other soft tissues: A 2.2 cm simple cyst is present
in the right kidney. A cystic lesion is present tail the pancreas
measuring 14 mm is stable no new solid organ lesions are present.
There is no significant adenopathy.

Disc levels:

L1-2: There is uncovering of a broad-based disc protrusion. Facet
hypertrophy is noted bilaterally. The central canal and foramina are
patent.

L2-3: A broad-based disc protrusion is asymmetric to the left. Left
subarticular narrowing is present. Moderate left mild right
foraminal narrowing is present.

L3-4: A broad-based disc protrusion is present. Advanced facet
hypertrophy is noted. This results in moderate central canal
stenosis with subarticular narrowing bilaterally. Moderate foraminal
narrowing is present bilaterally.

L4-5: There is uncovering of broad-based disc protrusion. Advanced
facet hypertrophy and spurring is noted. Moderate to severe central
canal stenosis is present with subarticular narrowing bilaterally.
Moderate to severe right and moderate left foraminal narrowing is
present.

L5-S1: There is uncovering of a broad-based disc protrusion. Central
annular tear is noted. Advanced facet hypertrophy is worse on left.
Central canal and foramina are patent.
IMPRESSION: 1. Multilevel spondylosis of the lumbar spine as described.
2. Subarticular narrowing at L2-3 with moderate left and right
foraminal stenosis.
3. Moderate central canal and bilateral foraminal narrowing at L3-4
secondary to a broad-based disc protrusion advanced facet
hypertrophy.
4. Moderate to central and right foraminal narrowing at L4-5.
Foraminal stenosis is moderate.
5. Advanced facet hypertrophy at L5-S1 is worse. There is no
associated stenosis.

## 2019-07-19 ENCOUNTER — Telehealth: Payer: Self-pay | Admitting: *Deleted

## 2019-07-19 NOTE — Telephone Encounter (Signed)
Patient requests a refill of ProAir  [facility] [place], [place]

## 2019-07-20 DIAGNOSIS — M9901 Segmental and somatic dysfunction of cervical region: Secondary | ICD-10-CM | POA: Diagnosis not present

## 2019-07-20 DIAGNOSIS — M9903 Segmental and somatic dysfunction of lumbar region: Secondary | ICD-10-CM | POA: Diagnosis not present

## 2019-07-20 DIAGNOSIS — M9905 Segmental and somatic dysfunction of pelvic region: Secondary | ICD-10-CM | POA: Diagnosis not present

## 2019-07-20 DIAGNOSIS — M9902 Segmental and somatic dysfunction of thoracic region: Secondary | ICD-10-CM | POA: Diagnosis not present

## 2019-07-20 MED ORDER — ALBUTEROL SULFATE HFA 108 (90 BASE) MCG/ACT IN AERS: INHALATION_SPRAY | RESPIRATORY_TRACT | 11 refills | Status: DC

## 2019-07-20 NOTE — Telephone Encounter (Signed)
Done

## 2019-07-26 ENCOUNTER — Telehealth: Payer: Self-pay

## 2019-07-26 DIAGNOSIS — E785 Hyperlipidemia, unspecified: Secondary | ICD-10-CM

## 2019-07-26 DIAGNOSIS — J4521 Mild intermittent asthma with (acute) exacerbation: Secondary | ICD-10-CM

## 2019-07-26 NOTE — Telephone Encounter (Signed)
I am requesting an ambulatory referral to CCM be placed for this patient. Referral will need to have 2 current diagnosis attached to it. Thank you!  

## 2019-07-27 ENCOUNTER — Ambulatory Visit: Payer: PPO

## 2019-07-27 ENCOUNTER — Other Ambulatory Visit: Payer: Self-pay

## 2019-07-27 DIAGNOSIS — G8929 Other chronic pain: Secondary | ICD-10-CM

## 2019-07-27 DIAGNOSIS — I5021 Acute systolic (congestive) heart failure: Secondary | ICD-10-CM

## 2019-07-27 DIAGNOSIS — I10 Essential (primary) hypertension: Secondary | ICD-10-CM

## 2019-07-27 DIAGNOSIS — M9902 Segmental and somatic dysfunction of thoracic region: Secondary | ICD-10-CM | POA: Diagnosis not present

## 2019-07-27 DIAGNOSIS — M9901 Segmental and somatic dysfunction of cervical region: Secondary | ICD-10-CM | POA: Diagnosis not present

## 2019-07-27 DIAGNOSIS — J4521 Mild intermittent asthma with (acute) exacerbation: Secondary | ICD-10-CM

## 2019-07-27 DIAGNOSIS — E785 Hyperlipidemia, unspecified: Secondary | ICD-10-CM

## 2019-07-27 DIAGNOSIS — M9903 Segmental and somatic dysfunction of lumbar region: Secondary | ICD-10-CM | POA: Diagnosis not present

## 2019-07-27 DIAGNOSIS — M9905 Segmental and somatic dysfunction of pelvic region: Secondary | ICD-10-CM | POA: Diagnosis not present

## 2019-07-27 NOTE — Patient Instructions (Addendum)
Visit Information  Goals Addressed            This Visit's Progress   . Pharmacy Care Plan       CARE PLAN ENTRY  Current Barriers:  . Chronic Disease Management support, education, and care coordination needs related to CHF, HTN, HLD, and Asthma, low back pain, osteoarthritis  Pharmacist Clinical Goal(s):  [name] Work with the care management team to address educational, disease management, and care coordination needs  . Call provider office for new or worsened signs and symptoms  . Call care management team with questions or concerns.  . Heart failure: Check weight daily and contact physician if weight gain of more than 3 pounds overnight or more than 5 pounds in a week . Blood pressure:  [name] Maintain blood pressure within goal of your provider (130/80 or 140/90)  . High cholesterol:  . Cholesterol goals: Total Cholesterol goal under 200, Triglycerides goal under 150, HDL goal above 40 (men) or above 50 (women), LDL goal under 100.  . Current cholesterol: total cholesterol: 192; triglycerides: 114; HDL: 61; LDL: 111.  . Asthma:  . Prevent worsening of shortness of breath and hospitalizations. . Low back pain: Continue to see improvement in pain level.  [name] Hot flashes: minimize hot flashes  Interventions: . Comprehensive medication review performed. . Evaluation of current treatment plans and patient's adherence to plan as established by provider . Assessed patient's education and care coordination needs . Provided disease specific education to patient . Blood pressure:  . Discussed diet modifications. DASH diet:  following a diet emphasizing fruits and vegetables and low-fat dairy products along with whole grains, fish, poultry, and nuts. Reducing red meats and sugars.  . Reducing the amount of salt intake to 1500mg /per day.  . Recommend using a salt substitute to replace your salt if you need flavor.    . Weight reduction- We discussed losing 5-10% of body weight . Continue:  bisoprolol 5mg , 1 tablet once daily and spironolactone 25mg , 1 tablet once daily.  [name] Heart failure:  . Weighing daily, if you gain more than 3 pounds in one day or 5 pounds in one week call your doctor.  . Continue:  - bisoprolol 5mg , 1 tablet once daily  - spironolactone 25mg , 1 tablet once daily  - Entresto 97-103mg , 1 tablet twice daily . High cholesterol . How to reduce cholesterol through diet/weight management and physical activity.    . We discussed how a diet high in plant sterols (fruits/vegetables/nuts/whole grains/legumes) may reduce your cholesterol.  Encouraged increasing fiber to a daily intake of 10-25g/day  . Continue: rosuvastatin 5mg , 1 tablet once daily  . Asthma . Continue: albuterol 11mcg/ act inhaler, inhale 2 puffs every four hours as needed for wheezing or shortness of breath  . Low back pain . Managed by specialist.  . Continue: meloxicam 15mg , 1 tablet once daily . Menopausal hot flashes . Managed by OB/GYN.  . Premarin 0.625mg , as directed  Patient Self Care Activities:  . Self administers medications as prescribed and Calls provider office for new concerns or questions . Continue current medications as directed by providers.  . Continue following up with specialists. . Continue at home blood pressure readings. . Continue working on healthy habits (diet).  Initial goal documentation        Tina May was given information about Chronic Care Management services today including:  1. CCM service includes personalized support from designated clinical staff supervised by her physician, including individualized plan of care  and coordination with other care providers 2. 24/7 contact phone numbers for assistance for urgent and routine care needs. 3. Standard insurance, coinsurance, copays and deductibles apply for chronic care management only during months in which we provide at least 20 minutes of these services. Most insurances cover these services at 100%,  however patients may be responsible for any copay, coinsurance and/or deductible if applicable. This service may help you avoid the need for more expensive face-to-face services. 4. Only one practitioner may furnish and bill the service in a calendar month. 5. The patient may stop CCM services at any time (effective at the end of the month) by phone call to the office staff.  Patient agreed to services and verbal consent obtained.   The patient verbalized understanding of instructions provided today and agreed to receive a mailed copy of patient instruction and/or educational materials. Telephone follow up appointment with pharmacy team member scheduled for: 07/25/2020.   Anson Crofts, PharmD Clinical Pharmacist [facility] [address] [phone]    [place] stands for "Dietary Approaches to Stop Hypertension." The DASH eating plan is a healthy eating plan that has been shown to reduce high blood pressure (hypertension). It may also reduce your risk for type 2 diabetes, heart disease, and stroke. The DASH eating plan may also help with weight loss. What are tips for following this plan?  General guidelines  Avoid eating more than 2,300 mg (milligrams) of salt (sodium) a day. If you have hypertension, you may need to reduce your sodium intake to 1,500 mg a day.  Limit alcohol intake to no more than 1 drink a day for nonpregnant women and 2 drinks a day for men. One drink equals 12 oz of beer, 5 oz of wine, or 1 oz of hard liquor.  Work with your health care provider to maintain a healthy body weight or to lose weight. Ask what an ideal weight is for you.  Get at least 30 minutes of exercise that causes your heart to beat faster (aerobic exercise) most days of the week. Activities may include walking, swimming, or biking.  Work with your health care provider or diet and nutrition specialist (dietitian) to adjust your eating plan to your individual  calorie needs. Reading food labels   Check food labels for the amount of sodium per serving. Choose foods with less than 5 percent of the Daily Value of sodium. Generally, foods with less than 300 mg of sodium per serving fit into this eating plan.  To find whole grains, look for the word "whole" as the first word in the ingredient list. Shopping  Buy products labeled as "low-sodium" or "no salt added."  Buy fresh foods. Avoid canned foods and premade or frozen meals. Cooking  Avoid adding salt when cooking. Use salt-free seasonings or herbs instead of table salt or sea salt. Check with your health care provider or pharmacist before using salt substitutes.  Do not fry foods. Cook foods using healthy methods such as baking, boiling, grilling, and broiling instead.  Cook with heart-healthy oils, such as olive, canola, soybean, or sunflower oil. Meal planning  Eat a balanced diet that includes: ? 5 or more servings of fruits and vegetables each day. At each meal, try to fill half of your plate with fruits and vegetables. ? Up to 6-8 servings of whole grains each day. ? Less than 6 oz of lean meat, poultry, or fish each day. A 3-oz serving of meat is about  the same size as a deck of cards. One egg equals 1 oz. ? 2 servings of low-fat dairy each day. ? A serving of nuts, seeds, or beans 5 times each week. ? Heart-healthy fats. Healthy fats called Omega-3 fatty acids are found in foods such as flaxseeds and coldwater fish, like sardines, salmon, and mackerel.  Limit how much you eat of the following: ? Canned or prepackaged foods. ? Food that is high in trans fat, such as fried foods. ? Food that is high in saturated fat, such as fatty meat. ? Sweets, desserts, sugary drinks, and other foods with added sugar. ? Full-fat dairy products.  Do not salt foods before eating.  Try to eat at least 2 vegetarian meals each week.  Eat more home-cooked food and less restaurant, buffet, and fast  food.  When eating at a restaurant, ask that your food be prepared with less salt or no salt, if possible. What foods are recommended? The items listed may not be a complete list. Talk with your dietitian about what dietary choices are best for you. Grains Whole-grain or whole-wheat bread. Whole-grain or whole-wheat pasta. Brown rice. [place]. Bulgur. Whole-grain and low-sodium cereals. Pita bread. Low-fat, low-sodium crackers. Whole-wheat flour tortillas. Vegetables Fresh or frozen vegetables (raw, steamed, roasted, or grilled). Low-sodium or reduced-sodium tomato and vegetable juice. Low-sodium or reduced-sodium tomato sauce and tomato paste. Low-sodium or reduced-sodium canned vegetables. Fruits All fresh, dried, or frozen fruit. Canned fruit in natural juice (without added sugar). Meat and other protein foods Skinless chicken or [place]. Ground chicken or [place]. Pork with fat trimmed off. Fish and seafood. Egg whites. Dried beans, peas, or lentils. Unsalted nuts, nut butters, and seeds. Unsalted canned beans. Lean cuts of beef with fat trimmed off. Low-sodium, lean deli meat. Dairy Low-fat (1%) or fat-free (skim) milk. Fat-free, low-fat, or reduced-fat cheeses. Nonfat, low-sodium ricotta or cottage cheese. Low-fat or nonfat yogurt. Low-fat, low-sodium cheese. Fats and oils Soft margarine without trans fats. Vegetable oil. Low-fat, reduced-fat, or light mayonnaise and salad dressings (reduced-sodium). Canola, safflower, olive, soybean, and sunflower oils. Avocado. Seasoning and other foods Herbs. Spices. Seasoning mixes without salt. Unsalted popcorn and pretzels. Fat-free sweets. What foods are not recommended? The items listed may not be a complete list. Talk with your dietitian about what dietary choices are best for you. Grains Baked goods made with fat, such as croissants, muffins, or some breads. Dry pasta or rice meal packs. Vegetables Creamed or fried vegetables. Vegetables  in a cheese sauce. Regular canned vegetables (not low-sodium or reduced-sodium). Regular canned tomato sauce and paste (not low-sodium or reduced-sodium). Regular tomato and vegetable juice (not low-sodium or reduced-sodium). Angie Fava. Olives. Fruits Canned fruit in a light or heavy syrup. Fried fruit. Fruit in cream or butter sauce. Meat and other protein foods Fatty cuts of meat. Ribs. Fried meat. Berniece Salines. Sausage. Bologna and other processed lunch meats. Salami. Fatback. Hotdogs. Bratwurst. Salted nuts and seeds. Canned beans with added salt. Canned or smoked fish. Whole eggs or egg yolks. Chicken or [place] with skin. Dairy Whole or 2% milk, cream, and half-and-half. Whole or full-fat cream cheese. Whole-fat or sweetened yogurt. Full-fat cheese. Nondairy creamers. Whipped toppings. Processed cheese and cheese spreads. Fats and oils Butter. Stick margarine. Lard. Shortening. Ghee. Bacon fat. Tropical oils, such as coconut, palm kernel, or palm oil. Seasoning and other foods Salted popcorn and pretzels. Onion salt, garlic salt, seasoned salt, table salt, and sea salt. Worcestershire sauce. Tartar sauce. Barbecue sauce. Teriyaki sauce. Soy sauce,  including reduced-sodium. Steak sauce. Canned and packaged gravies. Fish sauce. Oyster sauce. Cocktail sauce. Horseradish that you find on the shelf. Ketchup. Mustard. Meat flavorings and tenderizers. Bouillon cubes. Hot sauce and Tabasco sauce. Premade or packaged marinades. Premade or packaged taco seasonings. Relishes. Regular salad dressings. Where to find more information:  National Heart, Lung, and [place]: https://wilson-eaton.com/  American Heart Association: www.heart.org Summary  The DASH eating plan is a healthy eating plan that has been shown to reduce high blood pressure (hypertension). It may also reduce your risk for type 2 diabetes, heart disease, and stroke.  With the DASH eating plan, you should limit salt (sodium) intake to 2,300 mg a  day. If you have hypertension, you may need to reduce your sodium intake to 1,500 mg a day.  When on the DASH eating plan, aim to eat more fresh fruits and vegetables, whole grains, lean proteins, low-fat dairy, and heart-healthy fats.  Work with your health care provider or diet and nutrition specialist (dietitian) to adjust your eating plan to your individual calorie needs. This information is not intended to replace advice given to you by your health care provider. Make sure you discuss any questions you have with your health care provider. Document Revised: 04/03/2017 Document Reviewed: 04/14/2016 Elsevier Patient Education  2020 [employer].

## 2019-07-27 NOTE — Chronic Care Management (AMB) (Signed)
Chronic Care Management Pharmacy  Name: Tina May  MRN: [id number] DOB: Aug 01, 1945  Initial Questions: 1. Have you seen any other providers since your last visit? NA 2. Any changes in your medicines or health? No   Chief Complaint/ HPI Tina May,  74 y.o. , female presents for their Initial CCM visit with the clinical pharmacist via telephone due to COVID-19 Pandemic.  PCP : [name], MD  Their chronic conditions include: HTN, CHF, Asthma, low back pain, osteoarthritis, HLD   Office Visits: 04/07/2019- Patient presented to Dr. [name], MD for rash and ST. Rash attributed to anticoagulant effects of steroids and aspirin. Patient advised to stop aspirin. For ST, patient to try Xyzal daily since it is likely due to allergies.   01/28/2019- Patient presented to Dr. [name], MD for chronic low back pain and leg weakness. Patient obtained lumbar MRI in July showing disc disease. She saw PR and 2 chiropractors with no relief. Referral to neurosurgery to evaluate due to suggestion of lumbar spinal stenosis.   Consult Visit: 02/14/2019- Cardiology Patient presented for office visit with Dr. Mertie Moores, MD for CHF follow up. For CHF, patient reported no symptoms. Patient to continue bisoprolol, Entresto,and spironolactone. Patient to continue rosuvastatin for hyperlipidemia and have labs checked today.   Medications: Outpatient Encounter Medications as of 07/27/2019  Medication Sig  . albuterol (PROAIR HFA) 108 (90 Base) MCG/ACT inhaler INHALE 2 PUFFS INTO THE LUNGS EVERY 4 (FOUR) HOURS AS NEEDED FOR WHEEZING OR SHORTNESS OF BREATH.  . bisoprolol (ZEBETA) 5 MG tablet Take 1 tablet (5 mg total) by mouth daily.  [name] ENTRESTO 97-103 MG TAKE 1 TABLET BY MOUTH TWICE A DAY  . FIBER SELECT GUMMIES CHEW Chew 2 tablets by mouth daily. Total daily dose 10mg .  . Hyaluronic Acid-Vitamin C (HYALURONIC ACID PO) Take 1 tablet by mouth daily. Women's Hyaluronic Acid  . meloxicam (MOBIC)  15 MG tablet Take 15 mg by mouth daily.  . Multiple Vitamin (MULTIVITAMIN WITH MINERALS) TABS tablet Take 1 tablet by mouth daily. One-A-Day  . PREMARIN 0.625 MG tablet Take 0.625 mg by mouth daily.  . RESTASIS 0.05 % ophthalmic emulsion Place 1 drop into both eyes 2 (two) times daily.   . rosuvastatin (CRESTOR) 5 MG tablet TAKE 1 TABLET BY MOUTH EVERY DAY  . TURMERIC CURCUMIN PO Take 1,000 mg by mouth daily.  [name] aspirin EC 81 MG tablet Take 81 mg by mouth daily.  [name] estradiol (ESTRACE) 0.5 MG tablet Take 0.5 mg by mouth once a week. For Menopausal hot flashes  . Misc Natural Products (TART CHERRY ADVANCED PO) Take 60 mLs by mouth at bedtime.  [name] spironolactone (ALDACTONE) 25 MG tablet Take 1 tablet (25 mg total) by mouth daily.   No facility-administered encounter medications on file as of 07/27/2019.    Current Diagnosis/Assessment:  Goals Addressed            This Visit's Progress   . Pharmacy Care Plan       CARE PLAN ENTRY  Current Barriers:  . Chronic Disease Management support, education, and care coordination needs related to CHF, HTN, HLD, and Asthma, low back pain, osteoarthritis  Pharmacist Clinical Goal(s):  [name] Work with the care management team to address educational, disease management, and care coordination needs  . Call provider office for new or worsened signs and symptoms  . Call care management team with questions or concerns.  . Heart failure: Check weight daily and contact physician if  weight gain of more than 3 pounds overnight or more than 5 pounds in a week . Blood pressure:  [name] Maintain blood pressure within goal of your provider (130/80 or 140/90)  . High cholesterol:  . Cholesterol goals: Total Cholesterol goal under 200, Triglycerides goal under 150, HDL goal above 40 (men) or above 50 (women), LDL goal under 100.  . Current cholesterol: total cholesterol: 192; triglycerides: 114; HDL: 61; LDL: 111.  . Asthma:  . Prevent worsening of shortness of breath  and hospitalizations. . Low back pain: Continue to see improvement in pain level.  [name] Hot flashes: minimize hot flashes  Interventions: . Comprehensive medication review performed. . Evaluation of current treatment plans and patient's adherence to plan as established by provider . Assessed patient's education and care coordination needs . Provided disease specific education to patient . Blood pressure:  . Discussed diet modifications. DASH diet:  following a diet emphasizing fruits and vegetables and low-fat dairy products along with whole grains, fish, poultry, and nuts. Reducing red meats and sugars.  . Reducing the amount of salt intake to 1500mg /per day.  . Recommend using a salt substitute to replace your salt if you need flavor.    . Weight reduction- We discussed losing 5-10% of body weight . Managed by cardiologist.  . Continue: bisoprolol 5mg , 1 tablet once daily and spironolactone 25mg , 1 tablet once daily.  [name] Heart failure:  . Weighing daily, if you gain more than 3 pounds in one day or 5 pounds in one week call your doctor.  . Managed by cardiologist.  . Continue:  - bisoprolol 5mg , 1 tablet once daily  - spironolactone 25mg , 1 tablet once daily  - Entresto 97-103mg , 1 tablet twice daily . High cholesterol . How to reduce cholesterol through diet/weight management and physical activity.    . We discussed how a diet high in plant sterols (fruits/vegetables/nuts/whole grains/legumes) may reduce your cholesterol.  Encouraged increasing fiber to a daily intake of 10-25g/day  . Continue: rosuvastatin 5mg , 1 tablet once daily  . Asthma . Continue: albuterol 17mcg/ act inhaler, inhale 2 puffs every four hours as needed for wheezing or shortness of breath  . Low back pain . Managed by specialist.  . Continue: meloxicam 15mg , 1 tablet once daily . Menopausal hot flashes . Managed by OB/GYN.  . Premarin 0.625mg , as directed  Patient Self Care Activities:  . Self administers  medications as prescribed and Calls provider office for new concerns or questions . Continue current medications as directed by providers.  . Continue following up with specialists. . Continue at home blood pressure readings. . Continue working on healthy habits (diet).  Initial goal documentation        Heart Failure   Type: Systolic  Last ejection fraction: 45-50 % (04/22/2018)  NYHA Class: I (no actitivty limitation)  Patient has failed these meds in past: carvedilol, metoprolol  Patient is currently controlled on the following medications:  - bisoprolol 5mg , 1 tablet once daily  - spironolactone 25mg , 1 tablet once daily  - Entresto 97-103mg , 1 tablet twice daily   We discussed weighing daily; if you gain more than 3 pounds in one day or 5 pounds in one week call your doctor.  Plan Managed by cardiology.  Continue current medications  Hypertension   Office blood pressures are  BP Readings from Last 3 Encounters:  02/14/19 128/62  01/28/19 90/78  01/13/19 128/70   Patient has failed these meds in the past: amlodipine (stomach cramps,  diarrhea), diltiazem, valsartan  Patient checks BP at home 1-2x per week  Patient home BP readings are ranging: 90/60 mmHg.   Patient is controlled on:  - bisoprolol 5mg , 1 tablet once daily  - spironolactone 25mg , 1 tablet once daily   We discussed diet and exercise extensively  . Discussed diet modifications. DASH diet:  following a diet emphasizing fruits and vegetables and low-fat dairy products along with whole grains, fish, poultry, and nuts. Reducing red meats and sugars.  . Reducing the amount of salt intake to 1500mg /per day.  . Recommend using a salt substitute to replace your salt if you need flavor.    . Weight reduction- We discussed losing 5-10% of body weight  Plan Denies dizziness/ lightehadedness. Patient endorses recent weight loss of 25 lbs.  Continue current medications and control with diet and exercise.     Hyperlipidemia  Lipid Panel     Component Value Date/Time   CHOL 192 02/14/2019 0940   TRIG 114 02/14/2019 0940   HDL 61 02/14/2019 0940   CHOLHDL 3.1 02/14/2019 0940   CHOLHDL 4 09/11/2017 0814   VLDL 40.8 (H) 09/11/2017 0814   LDLCALC 111 (H) 02/14/2019 0940   LDLDIRECT 113.0 09/11/2017 0814   LABVLDL 20 02/14/2019 0940    The 10-year ASCVD risk score (Goff DC Jr., et al., 2013) is: 12.8%   Values used to calculate the score:     Age: 44 years     Sex: Female     Is Non-Hispanic African American: No     Diabetic: No     Tobacco smoker: No     Systolic Blood Pressure: [id number] mmHg     Is BP treated: Yes     HDL Cholesterol: 61 mg/dL     Total Cholesterol: 192 mg/dL   Patient has failed these meds in past: none  Patient is currently controlled on the following medications:  - rosuvastatin 5mg , 1 tablet once daily   We discussed:  diet and exercise extensively.  . How to reduce cholesterol through diet/weight management and physical activity.    . We discussed how a diet high in plant sterols (fruits/vegetables/nuts/whole grains/legumes) may reduce your cholesterol.   Plan Patient modified diet. Now on keto diet. Patient noted recent weight loss of 25 lbs.  Continue current medications and control with diet and exercise  Asthma  Eosinophil count:   Lab Results  Component Value Date/Time   EOSPCT 2.7 10/22/2018 11:39 AM  %                               Eos (Absolute):  Lab Results  Component Value Date/Time   EOSABS 0.2 10/22/2018 11:39 AM   Tobacco Status:  Social History   Tobacco Use  Smoking Status Former Smoker  . Packs/day: 1.00  . Years: 20.00  . Pack years: 20.00  . Types: Cigarettes  . Quit date: 05/06/1991  . Years since quitting: 28.2  Smokeless Tobacco Never Used   Patient has failed these meds in past: Flovent   Patient is currently controlled on the following medications:  - albuterol 130mcg/ act inhaler, inhale 2 puffs every four hours as  needed for wheezing or shortness of breath   Using maintenance inhaler regularly? No- not on maintenance inhaler.   Frequency of rescue inhaler use:  prn Patient notes recent trip to [facility] and had to use inhaler more often. She notes other than that,  she can go a year without using rescue inhaler. Currently she has not used over a week.   Plan Continue current medications  Low back pain   Patient is currently controlled on the following medications:  - Steroid injection  - meloxicam 15mg , 1 tablet once daily  We discussed:  cautioned use of meloxicam in setting of heart failure. Patient notes reviewed medications with cardiology office.    Plan Patient sees specialist.  Continue current medications  Menopausal hot flashes  Patient is currently managed on the following medications:  - Premarin 0.625mg , currently taking once a week.  Plan Managed by OB/GYN. Patient notes currently working closely with specialist and plans on coming off of it.  Continue current medications.   Medication Management  Patient organizes medications: sets them up on counter; does not use pill box  Barriers: denies issues obtaining medications.   Follow up Follow up visit with PharmD in 1 year. Will conduct general telephone calls for periodic check-ins before next visit.   Anson Crofts, PharmD Clinical Pharmacist [facility] [place] [phone]

## 2019-07-27 NOTE — Telephone Encounter (Signed)
Done

## 2019-08-04 DIAGNOSIS — M9902 Segmental and somatic dysfunction of thoracic region: Secondary | ICD-10-CM | POA: Diagnosis not present

## 2019-08-04 DIAGNOSIS — M9905 Segmental and somatic dysfunction of pelvic region: Secondary | ICD-10-CM | POA: Diagnosis not present

## 2019-08-04 DIAGNOSIS — M9903 Segmental and somatic dysfunction of lumbar region: Secondary | ICD-10-CM | POA: Diagnosis not present

## 2019-08-04 DIAGNOSIS — M9901 Segmental and somatic dysfunction of cervical region: Secondary | ICD-10-CM | POA: Diagnosis not present

## 2019-08-10 DIAGNOSIS — M9901 Segmental and somatic dysfunction of cervical region: Secondary | ICD-10-CM | POA: Diagnosis not present

## 2019-08-10 DIAGNOSIS — M9902 Segmental and somatic dysfunction of thoracic region: Secondary | ICD-10-CM | POA: Diagnosis not present

## 2019-08-10 DIAGNOSIS — M9905 Segmental and somatic dysfunction of pelvic region: Secondary | ICD-10-CM | POA: Diagnosis not present

## 2019-08-10 DIAGNOSIS — M9903 Segmental and somatic dysfunction of lumbar region: Secondary | ICD-10-CM | POA: Diagnosis not present

## 2019-08-24 DIAGNOSIS — M9905 Segmental and somatic dysfunction of pelvic region: Secondary | ICD-10-CM | POA: Diagnosis not present

## 2019-08-24 DIAGNOSIS — M9901 Segmental and somatic dysfunction of cervical region: Secondary | ICD-10-CM | POA: Diagnosis not present

## 2019-08-24 DIAGNOSIS — M9902 Segmental and somatic dysfunction of thoracic region: Secondary | ICD-10-CM | POA: Diagnosis not present

## 2019-08-24 DIAGNOSIS — M9903 Segmental and somatic dysfunction of lumbar region: Secondary | ICD-10-CM | POA: Diagnosis not present

## 2019-09-07 DIAGNOSIS — Z1283 Encounter for screening for malignant neoplasm of skin: Secondary | ICD-10-CM | POA: Diagnosis not present

## 2019-09-07 DIAGNOSIS — Z87891 Personal history of nicotine dependence: Secondary | ICD-10-CM | POA: Diagnosis not present

## 2019-09-07 DIAGNOSIS — D1801 Hemangioma of skin and subcutaneous tissue: Secondary | ICD-10-CM | POA: Diagnosis not present

## 2019-09-07 DIAGNOSIS — L578 Other skin changes due to chronic exposure to nonionizing radiation: Secondary | ICD-10-CM | POA: Diagnosis not present

## 2019-09-07 DIAGNOSIS — L812 Freckles: Secondary | ICD-10-CM | POA: Diagnosis not present

## 2019-09-07 DIAGNOSIS — L570 Actinic keratosis: Secondary | ICD-10-CM | POA: Diagnosis not present

## 2019-09-07 DIAGNOSIS — L814 Other melanin hyperpigmentation: Secondary | ICD-10-CM | POA: Diagnosis not present

## 2019-09-07 DIAGNOSIS — L720 Epidermal cyst: Secondary | ICD-10-CM | POA: Diagnosis not present

## 2019-09-07 DIAGNOSIS — D229 Melanocytic nevi, unspecified: Secondary | ICD-10-CM | POA: Diagnosis not present

## 2019-09-07 DIAGNOSIS — L409 Psoriasis, unspecified: Secondary | ICD-10-CM | POA: Diagnosis not present

## 2019-09-07 DIAGNOSIS — L309 Dermatitis, unspecified: Secondary | ICD-10-CM | POA: Diagnosis not present

## 2019-09-07 DIAGNOSIS — L821 Other seborrheic keratosis: Secondary | ICD-10-CM | POA: Diagnosis not present

## 2019-09-07 DIAGNOSIS — Z808 Family history of malignant neoplasm of other organs or systems: Secondary | ICD-10-CM | POA: Diagnosis not present

## 2019-09-07 DIAGNOSIS — Z85828 Personal history of other malignant neoplasm of skin: Secondary | ICD-10-CM | POA: Diagnosis not present

## 2019-09-09 ENCOUNTER — Other Ambulatory Visit: Payer: Self-pay

## 2019-09-09 ENCOUNTER — Encounter: Payer: Self-pay | Admitting: Cardiovascular Disease

## 2019-09-09 ENCOUNTER — Ambulatory Visit: Payer: PPO | Admitting: Cardiovascular Disease

## 2019-09-09 VITALS — BP 114/68 | HR 60 | Ht 62.0 in | Wt 160.5 lb

## 2019-09-09 DIAGNOSIS — E785 Hyperlipidemia, unspecified: Secondary | ICD-10-CM | POA: Diagnosis not present

## 2019-09-09 DIAGNOSIS — I5022 Chronic systolic (congestive) heart failure: Secondary | ICD-10-CM

## 2019-09-09 DIAGNOSIS — I5042 Chronic combined systolic (congestive) and diastolic (congestive) heart failure: Secondary | ICD-10-CM

## 2019-09-09 DIAGNOSIS — I10 Essential (primary) hypertension: Secondary | ICD-10-CM | POA: Diagnosis not present

## 2019-09-09 NOTE — Patient Instructions (Signed)
Medication Instructions:  Your physician recommends that you continue on your current medications as directed. Please refer to the Current Medication list given to you today.  *If you need a refill on your cardiac medications before your next appointment, please call your pharmacy*   Lab Work: None Ordered If you have labs (blood work) drawn today and your tests are completely normal, you will receive your results only by: [name] MyChart Message (if you have MyChart) OR . A paper copy in the mail If you have any lab test that is abnormal or we need to change your treatment, we will call you to review the results.    Testing/Procedures: None Ordered    Follow-Up: At [facility], you and your health needs are our priority.  As part of our continuing mission to provide you with exceptional heart care, we have created designated Provider Care Teams.  These Care Teams include your primary Cardiologist (physician) and Advanced Practice Providers (APPs -  Physician Assistants and Nurse Practitioners) who all work together to provide you with the care you need, when you need it.  We recommend signing up for the patient portal called "MyChart".  Sign up information is provided on this After Visit Summary.  MyChart is used to connect with patients for Virtual Visits (Telemedicine).  Patients are able to view lab/test results, encounter notes, upcoming appointments, etc.  Non-urgent messages can be sent to your provider as well.   To learn more about what you can do with MyChart, go to [email or web address].    Your next appointment:   6 month(s)  The format for your next appointment:   Either In Person or Virtual  Provider:   [name], PA

## 2019-09-09 NOTE — Progress Notes (Signed)
Cardiology Office Note:    Date:  09/09/2019   ID:  Tina May, Tina May 1945-05-25, MRN [id number]  PCP:  [name], MD  Cardiologist:  Mertie Moores, MD    Referring MD: [name], MD   Problem List 1. Chronic systolic CHF  2. Essential HTN 3. Hyperlipidemia 4. COPD  5. Pancreatic lesion:  Chief Complaint  Patient presents with  . Congestive Heart Failure  . Hyperlipidemia  . Hypertension    Tina May is a 74 y.o. female with a hx of chronic systolic CHF in June, [id number].  I saw her in consultation while in the hospital .  EF by echo at that time was 25-30%.Her last week and is unsure and it looks her heart function is improving as we started at the 25-30 range 45 kg  She has been on Entresto 24-26 mg BID Coreg 6.25 BID Lasix 40 mg a day   Feeling much better Cath 01/27/17 showed mild RCA 10% stenosis - otherwise normal coronaries.  Feeling great. Tolerating the Entresto  No shortness of breath   Aug. 30, 2019:  Tina May is seen today for follow-up of her congestive heart failure.  She is done very well.  She is on Entresto 97-103 mg a day.  She is tolerating it quite well. Exercising regularly  - exercise capacity is much better.   Had a great trip to [place] recently  - fished, dog sledded, rode in a helicopter.   November 12, 2018   Continues to travel.   Back to [place] this past year.   car[place] cruise, traveled to [place].   Several months ago ,  Had tachypalps .  Saw [name] , NP  . Was started on amlodipine ( caused upset stomach, diarrhea )  Has continued to have GI problems.   She thinks it her carvedilol ( process of elimination )  She did a [employer] screening test.  She was found to have very mild right carotid artery disease.  Her ankle-brachial indexes were normal.  Ultrasound of the aorta was normal.  BMI was 32.  She needs to have GYN surgery ,   Needs oopherectomy. [name] No CP , no dypnsea She is at low risk for her upcoming GYN  surgery .   February 14, 2019: She is seen today for follow-up of her chronic systolic congestive heart failure.  She has not tolerated carvedilol so we changed to Toprol-XL.  The Toprol caused similar GI upset.  Changed to Bisoprolol  breathing is great.   Is exercising .   Plays golf 3 times a week.   Rowing [place],  [place], yard work .   Sep 09, 2019: Tina May  is seen today for follow-up visit for her chronic systolic congestive heart failure. Her most recent echocardiogram was December, 2019 which revealed mildly depressed left ventricular systolic function with an ejection fraction 45 to 50%.  She has grade 1 diastolic dysfunction.  She has mild aortic regurgitation and mild mitral regurgitation.  Continues to have back issues - spinal stenosis  No CP or dyspnea  Past Medical History:  Diagnosis Date  . Arthritis   . Complex cyst of right ovary   . Diverticulosis   . External hemorrhoids   . History of basal cell carcinoma (BCC) excision   . Hyperlipidemia   . Hypertension    cardiologist-- dr Cathie Olden;  per cardiac cath 01-27-2017  no sig. cad,  10% pRCA to mRCA  . Intermittent palpitations (  01-12-2019 per denies any palpitations)   event monitor 10-27-2018 (dr Cathie Olden)  SR including ST and SB with two episodes NSVT  . Mild asthma   . NICM (nonischemic cardiomyopathy) ([place]) followed by dr Cathie Olden   dx 06/ 2018  ef 25-30%;   stress echo 12-22-2016 no ischemia, ef 30-35%;   last echo 04-22-2018 ef 45-50%  . Pancreatic lesion 03/11/2013   last CT abd. 10-27-2018, stable  . Systolic CHF, chronic ([place]) dx 06/ 2018   followed by dr Cathie Olden;  per echo 04-22-2018 ef 45-50%, G1DD    Past Surgical History:  Procedure Laterality Date  . COLONOSCOPY  01/25/2014   per Dr. [name], diverticulae only, repeat in 10 yrs   . HAND TENDON SURGERY Right 2014  approx.  [name] HEMICOLECTOMY  2005   resection colon tumor, per pt benign  . LEFT HEART CATH AND CORONARY ANGIOGRAPHY N/A 01/27/2017   Procedure:  LEFT HEART CATH AND CORONARY ANGIOGRAPHY;  Surgeon: Martinique, Peter M, MD;  Location: [facility];  Service: Cardiovascular;  Laterality: N/A;  . ROBOTIC ASSISTED SALPINGO OOPHERECTOMY Bilateral 01/13/2019   Procedure: XI ROBOTIC ASSISTED SALPINGO OOPHORECTOMY;  Surgeon: [name], MD;  Location: [facility];  Service: Gynecology;  Laterality: Bilateral;  . TONSILLECTOMY  age 36  . VAGINAL HYSTERECTOMY  1994    Current Medications: Current Meds  Medication Sig  . albuterol (PROAIR HFA) 108 (90 Base) MCG/ACT inhaler INHALE 2 PUFFS INTO THE LUNGS EVERY 4 (FOUR) HOURS AS NEEDED FOR WHEEZING OR SHORTNESS OF BREATH.  . bisoprolol (ZEBETA) 5 MG tablet Take 1 tablet (5 mg total) by mouth daily.  [name] ENTRESTO 97-103 MG TAKE 1 TABLET BY MOUTH TWICE A DAY  . FIBER SELECT GUMMIES CHEW Chew 2 tablets by mouth daily. Total daily dose 10mg .  . Hyaluronic Acid-Vitamin C (HYALURONIC ACID PO) Take 1 tablet by mouth daily. Women's Hyaluronic Acid  . meloxicam (MOBIC) 15 MG tablet Take 15 mg by mouth daily.  . Multiple Vitamin (MULTIVITAMIN WITH MINERALS) TABS tablet Take 1 tablet by mouth daily. One-A-Day  . PREMARIN 0.625 MG tablet Take 0.625 mg by mouth daily.  . RESTASIS 0.05 % ophthalmic emulsion Place 1 drop into both eyes 2 (two) times daily.   . rosuvastatin (CRESTOR) 5 MG tablet TAKE 1 TABLET BY MOUTH EVERY DAY  . spironolactone (ALDACTONE) 25 MG tablet Take 1 tablet (25 mg total) by mouth daily.  . TURMERIC CURCUMIN PO Take 1,000 mg by mouth daily.     Allergies:   Amlodipine, Atorvastatin, Diclofenac, and Morphine and related   Social History   Socioeconomic History  . Marital status: Widowed    Spouse name: Not on file  . Number of children: 3  . Years of education: Not on file  . Highest education level: Not on file  Occupational History  . Occupation: Retired  Tobacco Use  . Smoking status: Former Smoker    Packs/day: 1.00    Years: 20.00    Pack years: 20.00      Types: Cigarettes    Quit date: 05/06/1991    Years since quitting: 28.3  . Smokeless tobacco: Never Used  Substance and Sexual Activity  . Alcohol use: Yes    Alcohol/week: 8.0 standard drinks    Types: 7 Glasses of wine, 1 Standard drinks or equivalent per week  . Drug use: Never  . Sexual activity: Yes    Birth control/protection: Surgical  Other Topics Concern  . Not on file  Social History Narrative  . Not on file   Social Determinants of Health   Financial Resource Strain:   . Difficulty of Paying Living Expenses:   Food Insecurity:   . Worried About Charity fundraiser in the Last Year:   . Arboriculturist in the Last Year:   Transportation Needs:   . Film/video editor (Medical):   [name] Lack of Transportation (Non-Medical):   Physical Activity:   . Days of Exercise per Week:   . Minutes of Exercise per Session:   Stress:   . Feeling of Stress :   Social Connections:   . Frequency of Communication with Friends and Family:   . Frequency of Social Gatherings with Friends and Family:   . Attends Religious Services:   . Active Member of Clubs or Organizations:   . Attends Archivist Meetings:   [name] Marital Status:      Family History: The patient's family history includes Healthy in her brother and sister; Hyperlipidemia in an other family member; Hypertension in an other family member; Prostate cancer in an other family member. There is no history of Colon cancer, Esophageal cancer, Inflammatory bowel disease, Liver disease, Pancreatic cancer, Rectal cancer, or Stomach cancer. ROS:   Please see the history of present illness.     All other systems reviewed and are negative.  EKGs/Labs/Other Studies Reviewed:    The following studies were reviewed today:     Recent Labs: 09/20/2018: Magnesium 2.1; TSH 0.804 01/11/2019: Hemoglobin 13.4; Platelets 234 02/14/2019: ALT 23; BUN 32; Creatinine, Ser 0.89; Potassium 4.8; Sodium 140  Recent Lipid Panel     Component Value Date/Time   CHOL 192 02/14/2019 0940   TRIG 114 02/14/2019 0940   HDL 61 02/14/2019 0940   CHOLHDL 3.1 02/14/2019 0940   CHOLHDL 4 09/11/2017 0814   VLDL 40.8 (H) 09/11/2017 0814   LDLCALC 111 (H) 02/14/2019 0940   LDLDIRECT 113.0 09/11/2017 0814    Physical Exam: Blood pressure 114/68, pulse 60, height 5\' 2"  (1.575 m), weight 160 lb 8 oz (72.8 kg), SpO2 97 %.  GEN:  Elderly female,   NAD  HEENT: Normal NECK: No JVD; No carotid bruits LYMPHATICS: No lymphadenopathy CARDIAC: RRR , no murmurs, rubs, gallops RESPIRATORY:  Clear to auscultation without rales, wheezing or rhonchi  ABDOMEN: Soft, non-tender, non-distended MUSCULOSKELETAL:  No edema; No deformity  SKIN: Warm and dry NEUROLOGIC:  Alert and oriented x 3  EKG: Sep 09, 2019: Normal sinus rhythm at 60.  Nonspecific ST abnormalities in the lateral leads.  ASSESSMENT:    1. Hyperlipidemia, unspecified hyperlipidemia type   2. Chronic combined systolic and diastolic CHF (congestive heart failure) ([place])   3. Essential hypertension    PLAN:    In order of problems listed above:  1.  Chronic Systolic CHF:   EF has improved from 25% to 45-50%. Cont current meds.  She is good with her dietary restrictions    2. Hyperlipidemia:    Labs have been good   She will follow up with [name], PA in 6 months and i'll see her in 1 year   Medication Adjustments/Labs and Tests Ordered: Current medicines are reviewed at length with the patient today.  Concerns regarding medicines are outlined above.  Orders Placed This Encounter  Procedures  . EKG 12-Lead   No orders of the defined types were placed in this encounter.   Signed, Mertie Moores, MD  09/09/2019 9:54 AM  [facility]

## 2019-09-19 DIAGNOSIS — Z6829 Body mass index (BMI) 29.0-29.9, adult: Secondary | ICD-10-CM | POA: Diagnosis not present

## 2019-09-19 DIAGNOSIS — M48062 Spinal stenosis, lumbar region with neurogenic claudication: Secondary | ICD-10-CM | POA: Diagnosis not present

## 2019-09-26 ENCOUNTER — Ambulatory Visit: Payer: PPO | Admitting: Cardiovascular Disease

## 2019-10-14 ENCOUNTER — Telehealth: Payer: Self-pay | Admitting: Family Medicine

## 2019-10-14 DIAGNOSIS — M5441 Lumbago with sciatica, right side: Secondary | ICD-10-CM

## 2019-10-14 DIAGNOSIS — M545 Low back pain, unspecified: Secondary | ICD-10-CM | POA: Insufficient documentation

## 2019-10-14 NOTE — Telephone Encounter (Signed)
The referral was done  

## 2019-10-14 NOTE — Addendum Note (Signed)
Addended by: [name] A on: 10/14/2019 02:02 PM   Modules accepted: Orders

## 2019-10-14 NOTE — Telephone Encounter (Signed)
Patient is aware that the referral has bene placed and that their office will reach out to her for scheduling.

## 2019-10-14 NOTE — Telephone Encounter (Signed)
Pt is calling to see if she can get a referral for her spinal stenosis (2nd opinion) and would like to go to the Spinal Clinic at [facility] send a copy of her records and MRI results and we will have to call [employer]) plan to get pre-authorization and fax the pre-authorization to Spinal Clinic at [facility] [phone] and afterwards pt will be able to make an appointment with [place].

## 2019-10-25 ENCOUNTER — Other Ambulatory Visit: Payer: Self-pay | Admitting: Family Medicine

## 2019-10-25 NOTE — Telephone Encounter (Signed)
Please advise. Last Rx says to "stop at discharge"

## 2019-10-28 NOTE — Telephone Encounter (Signed)
Please advise. Ok to change referral?

## 2019-10-28 NOTE — Telephone Encounter (Signed)
Pt is calling in stating that [facility] has yet to call her back with a appointment so now she would like to have a referral sent to [facility] [phone](p) [phone] (f).  Pt would like for [place] to pls make sure that we fax office notes to support the MRI results stating that she has tried other things faxed.

## 2019-10-31 NOTE — Telephone Encounter (Signed)
Patient called to give a fax number [phone]

## 2019-11-01 NOTE — Telephone Encounter (Signed)
Instead of a new referral, Hilda Blades could you please contact the [facility] people and see if we can speed things up?

## 2019-11-04 NOTE — Telephone Encounter (Signed)
I faxed referral/notes to [phone]  Again  to To the Spinal Clinic at [facility]. I call [employer] at [phone] to see if authorization is needed because  Spine  [facility] said authorization might be required. The Representative noted that CPT [phone] is not required for authorization under the member plan.so all notes were faxed and received

## 2019-11-09 DIAGNOSIS — M47816 Spondylosis without myelopathy or radiculopathy, lumbar region: Secondary | ICD-10-CM | POA: Diagnosis not present

## 2019-11-09 DIAGNOSIS — M5136 Other intervertebral disc degeneration, lumbar region: Secondary | ICD-10-CM | POA: Diagnosis not present

## 2019-11-09 DIAGNOSIS — R5383 Other fatigue: Secondary | ICD-10-CM | POA: Diagnosis not present

## 2019-11-09 DIAGNOSIS — M48061 Spinal stenosis, lumbar region without neurogenic claudication: Secondary | ICD-10-CM | POA: Diagnosis not present

## 2019-11-09 DIAGNOSIS — Z87891 Personal history of nicotine dependence: Secondary | ICD-10-CM | POA: Diagnosis not present

## 2019-11-09 DIAGNOSIS — M4316 Spondylolisthesis, lumbar region: Secondary | ICD-10-CM | POA: Diagnosis not present

## 2019-11-10 ENCOUNTER — Other Ambulatory Visit: Payer: Self-pay | Admitting: Cardiovascular Disease

## 2019-11-27 ENCOUNTER — Other Ambulatory Visit: Payer: Self-pay | Admitting: Cardiovascular Disease

## 2019-11-30 ENCOUNTER — Other Ambulatory Visit: Payer: Self-pay

## 2019-11-30 ENCOUNTER — Telehealth: Payer: Self-pay | Admitting: Physician Assistant

## 2019-11-30 MED ORDER — SPIRONOLACTONE 25 MG PO TABS: 25.0000 mg | ORAL_TABLET | Freq: Every day | ORAL | 2 refills | Status: DC

## 2019-11-30 NOTE — Telephone Encounter (Signed)
LVM for patient to return call to be scheduled for follow up with [place]

## 2020-01-04 DIAGNOSIS — M419 Scoliosis, unspecified: Secondary | ICD-10-CM | POA: Diagnosis not present

## 2020-01-04 DIAGNOSIS — M5136 Other intervertebral disc degeneration, lumbar region: Secondary | ICD-10-CM | POA: Diagnosis not present

## 2020-01-04 DIAGNOSIS — M4317 Spondylolisthesis, lumbosacral region: Secondary | ICD-10-CM | POA: Diagnosis not present

## 2020-01-04 DIAGNOSIS — M4312 Spondylolisthesis, cervical region: Secondary | ICD-10-CM | POA: Diagnosis not present

## 2020-01-04 DIAGNOSIS — M4316 Spondylolisthesis, lumbar region: Secondary | ICD-10-CM | POA: Diagnosis not present

## 2020-01-04 DIAGNOSIS — M47816 Spondylosis without myelopathy or radiculopathy, lumbar region: Secondary | ICD-10-CM | POA: Diagnosis not present

## 2020-01-12 NOTE — Progress Notes (Signed)
Cardiology Office Note   Date:  01/13/2020   ID:  Tina, May [birth year], MRN [id number]  PCP:  [name], MD Cardiologist:  Mertie Moores, MD 09/09/2019 Electrphysiologist: None [name], PA-C   History of Present Illness: Tina May is a 74 y.o. female with a history of asthma (mold is a trigger), cardiomyopathy (noCAD at cath 01/2017),S-CHF with an EF up to 45-50% 12/2019echo, HTN, HLD, brief NSVT on monitor, pancreatic lesion stable on CT >> no further testing  Tina May presents for cardiology follow up.   She is doing well from a cardiac standpoint.   No LE edema, no orthopnea or PND.  No chest pain. No problems with exertion.   No more palpitations, those resolved. She stopped caffeine intake, that may have helped.   No problems with medications.   She has 3 discs in her lumbar back that are bad and spinal stenosis. She needs back surgery, is supposed to have a less invasive procedure at [facility]. If this does not work, she will need a larger back surgery.   Although she walks with a pronounced limp and cannot do stairs well, she has been able to remain active around the house and maintains her yard as she always has. Still plays golf several times a week.  COVID status: vaccinated, did not have COVID Past Medical History:  Diagnosis Date  . Arthritis   . Complex cyst of right ovary   . DDD (degenerative disc disease), lumbar   . Diverticulosis   . External hemorrhoids   . History of basal cell carcinoma (BCC) excision   . HTN (hypertension)   . Hyperlipidemia   . Intermittent palpitations    event monitor 10-27-2018 (Dr Acie Fredrickson)  SR including ST and SB with two episodes NSVT  . Mild asthma   . NICM (nonischemic cardiomyopathy) ([place])    Dr Acie Fredrickson, dx 06/ 2018  ef 25-30%;   stress echo 12-22-2016 no ischemia, ef 30-35%; last echo 04-22-2018 ef 45-50%; cath 01-27-2017  no sig. cad,10% pRCA to mRCA  . Pancreatic lesion 03/11/2013    last CT abd. 10-27-2018, stable  . Spinal stenosis of lumbar region   . Systolic CHF, chronic ([place]) 10/2016   followed by Dr Acie Fredrickson;  per echo 04-22-2018 ef 45-50%, G1DD    Past Surgical History:  Procedure Laterality Date  . COLONOSCOPY  01/25/2014   per Dr. [name], diverticulae only, repeat in 10 yrs   . HAND TENDON SURGERY Right 2014  approx.  [name] HEMICOLECTOMY  2005   resection colon tumor, per pt benign  . LEFT HEART CATH AND CORONARY ANGIOGRAPHY N/A 01/27/2017   Procedure: LEFT HEART CATH AND CORONARY ANGIOGRAPHY;  Surgeon: Martinique, Peter M, MD;  Location: [facility];  Service: Cardiovascular;  Laterality: N/A;  . ROBOTIC ASSISTED SALPINGO OOPHERECTOMY Bilateral 01/13/2019   Procedure: XI ROBOTIC ASSISTED SALPINGO OOPHORECTOMY;  Surgeon: [name], MD;  Location: [facility];  Service: Gynecology;  Laterality: Bilateral;  . TONSILLECTOMY  age 58  . VAGINAL HYSTERECTOMY  1994    Current Outpatient Medications  Medication Sig Dispense Refill  . albuterol (PROAIR HFA) 108 (90 Base) MCG/ACT inhaler INHALE 2 PUFFS INTO THE LUNGS EVERY 4 (FOUR) HOURS AS NEEDED FOR WHEEZING OR SHORTNESS OF BREATH. 18 g 11  . bisoprolol (ZEBETA) 5 MG tablet TAKE 1 TABLET BY MOUTH EVERY DAY 90 tablet 0  . ENTRESTO 97-103 MG TAKE 1 TABLET BY MOUTH TWICE A DAY  60 tablet 10  . FIBER SELECT GUMMIES CHEW Chew 2 tablets by mouth daily. Total daily dose 10mg .    . Hyaluronic Acid-Vitamin C (HYALURONIC ACID PO) Take 1 tablet by mouth daily. Women's Hyaluronic Acid    . meloxicam (MOBIC) 15 MG tablet TAKE 1 TABLET BY MOUTH EVERY DAY 90 tablet 1  . Multiple Vitamin (MULTIVITAMIN WITH MINERALS) TABS tablet Take 1 tablet by mouth daily. One-A-Day    . PREMARIN 0.625 MG tablet Take 0.625 mg by mouth daily.    . RESTASIS 0.05 % ophthalmic emulsion Place 1 drop into both eyes 2 (two) times daily.     . rosuvastatin (CRESTOR) 5 MG tablet TAKE 1 TABLET BY MOUTH EVERY DAY 90 tablet 1  .  spironolactone (ALDACTONE) 25 MG tablet Take 1 tablet (25 mg total) by mouth daily. 90 tablet 2  . TURMERIC CURCUMIN PO Take 1,000 mg by mouth daily.     No current facility-administered medications for this visit.    Allergies:   Amlodipine, Atorvastatin, Diclofenac, and Morphine and related    Social History:  The patient  reports that she quit smoking about 28 years ago. Her smoking use included cigarettes. She has a 20.00 pack-year smoking history. She has never used smokeless tobacco. She reports current alcohol use of about 8.0 standard drinks of alcohol per week. She reports that she does not use drugs.   Family History:  The patient's family history includes Healthy in her brother and sister; Hyperlipidemia in an other family member; Hypertension in an other family member; Prostate cancer in an other family member.  She indicated that her mother is deceased. She indicated that her father is deceased. She indicated that her sister is alive. She indicated that her brother is alive. She indicated that her maternal grandmother is deceased. She indicated that her maternal grandfather is deceased. She indicated that her paternal grandmother is deceased. She indicated that her paternal grandfather is deceased. She indicated that the status of her neg hx is unknown.   ROS:  Please see the history of present illness. All other systems are reviewed and negative.    PHYSICAL EXAM: VS:  BP 120/72   Pulse 60   Ht 5\' 2"  (1.575 m)   Wt 162 lb 3.2 oz (73.6 kg)   SpO2 97%   BMI 29.67 kg/m  , BMI Body mass index is 29.67 kg/m. GEN: Well nourished, well developed, female in no acute distress HEENT: normal for age  Neck: minimal JVD, no carotid bruit, no masses Cardiac: RRR; no murmur, no rubs, or gallops Respiratory:  clear to auscultation bilaterally, normal work of breathing GI: soft, nontender, nondistended, + BS MS: no deformity or atrophy; no edema; distal pulses are 2+ in all 4  extremities  Skin: warm and dry, no rash Neuro:  Strength and sensation are intact Psych: euthymic mood, full affect   EKG:  EKG is not ordered today. ECG from 09/09/2019 is reviewed, SR, HR 60, no acute ischemic changes, no change from previous   ECHO: 04/22/2018 - Left ventricle: The cavity size was normal. Wall thickness was  normal. Systolic function was mildly reduced. The estimated  ejection fraction was in the range of 45% to 50%. Diffuse  hypokinesis. Doppler parameters are consistent with abnormal left  ventricular relaxation (grade 1 diastolic dysfunction).  - Aortic valve: There was mild regurgitation.  - Mitral valve: Calcified annulus. There was mild regurgitation.  - Left atrium: The atrium was mildly dilated.  - Pericardium,  extracardiac: A trivial pericardial effusion was  identified.  Impressions:  - Mild global reduction in LV systolic function; mild diastolic  dysfunction; mild AI and MR; mild LAE.   CATH: 01/27/2017  Prox RCA to Mid RCA lesion, 10 %stenosed.  There is mild left ventricular systolic dysfunction.  LV end diastolic pressure is normal.   1. No significant CAD 2. Mild LV dysfunction. EF is estimated at 45% 3. Normal LVEDP  Plan: continue medical therapy.   MONITOR:  10/2018  Sinus rhythm including sinus tachycardia and sinus bradycardia  2 episodes of NS Ventricular tachycardia. 6 beat episode and a 5 beat episode    Recent Labs: 02/14/2019: ALT 23; BUN 32; Creatinine, Ser 0.89; Potassium 4.8; Sodium 140  CBC    Component Value Date/Time   WBC 7.1 01/11/2019 1512   RBC 4.28 01/11/2019 1512   HGB 13.4 01/11/2019 1512   HGB 13.8 01/26/2017 0903   HCT 41.8 01/11/2019 1512   HCT 41.1 01/26/2017 0903   PLT 234 01/11/2019 1512   PLT 274 01/26/2017 0903   MCV 97.7 01/11/2019 1512   MCV 92 01/26/2017 0903   MCH 31.3 01/11/2019 1512   MCHC 32.1 01/11/2019 1512   RDW 13.2 01/11/2019 1512   RDW 14.5 01/26/2017 0903    LYMPHSABS 1.4 10/22/2018 1139   MONOABS 0.6 10/22/2018 1139   EOSABS 0.2 10/22/2018 1139   BASOSABS 0.1 10/22/2018 1139   CMP Latest Ref Rng & Units 02/14/2019 01/11/2019 12/30/2018  Glucose 65 - 99 mg/dL 107(H) 103(H) 105(H)  BUN 8 - 27 mg/dL 32(H) 33(H) 29(H)  Creatinine 0.57 - 1.00 mg/dL 0.89 0.85 0.87  Sodium 134 - 144 mmol/L 140 142 142  Potassium 3.5 - 5.2 mmol/L 4.8 4.0 5.0  Chloride 96 - 106 mmol/L 102 109 103  CO2 20 - 29 mmol/L 24 22 24   Calcium 8.7 - 10.3 mg/dL 9.8 9.3 10.0  Total Protein 6.0 - 8.5 g/dL 6.6 - -  Total Bilirubin 0.0 - 1.2 mg/dL 0.3 - -  Alkaline Phos 39 - 117 IU/L 61 - -  AST 0 - 40 IU/L 21 - -  ALT 0 - 32 IU/L 23 - -    Lipid Panel Lab Results  Component Value Date   CHOL 192 02/14/2019   HDL 61 02/14/2019   LDLCALC 111 (H) 02/14/2019   LDLDIRECT 113.0 09/11/2017   TRIG 114 02/14/2019   CHOLHDL 3.1 02/14/2019      Wt Readings from Last 3 Encounters:  01/13/20 162 lb 3.2 oz (73.6 kg)  09/09/19 160 lb 8 oz (72.8 kg)  02/14/19 165 lb 12.8 oz (75.2 kg)     Other studies Reviewed: Additional studies/ records that were reviewed today include: Office notes, hospital records and testing.  ASSESSMENT AND PLAN:  1.  Chronic systolic CHF:  - Volume status is stable - wt is stable - she is compliant w/ low-Na diet and meds - she does not want CHF sx, understands the connection between lifestyle and exacerbation of sx. - no further testing/eval needed at this time  2. Pre-op evaluation - She will need back surgery in the near future, may include rods and pins. -  RCRI is 1, giving her a 0.9% chance of major perioperative events - DASI is 17.95, with functional capacity in METS of 4.95 - She is at acceptable risk for surgery, without further cardiac workup.  4. Hyperlipidemia - she is complaint w/ Crestor 5 mg qd - it has been almost a year  since labs done, ck CMET, Lipid profile today   Current medicines are reviewed at length with the patient  today.  The patient does not have concerns regarding medicines.  The following changes have been made:  no change  Labs/ tests ordered today include:  No orders of the defined types were placed in this encounter.    Disposition:   FU with Mertie Moores, MD  Signed, [name], PA-C  01/13/2020 10:20 AM    [place] Phone: [phone]; Fax: [phone]

## 2020-01-13 ENCOUNTER — Ambulatory Visit: Payer: PPO | Admitting: Physician Assistant

## 2020-01-13 ENCOUNTER — Encounter: Payer: Self-pay | Admitting: Physician Assistant

## 2020-01-13 ENCOUNTER — Other Ambulatory Visit: Payer: Self-pay

## 2020-01-13 VITALS — BP 120/72 | HR 60 | Ht 62.0 in | Wt 162.2 lb

## 2020-01-13 DIAGNOSIS — I10 Essential (primary) hypertension: Secondary | ICD-10-CM

## 2020-01-13 DIAGNOSIS — I5021 Acute systolic (congestive) heart failure: Secondary | ICD-10-CM

## 2020-01-13 DIAGNOSIS — E785 Hyperlipidemia, unspecified: Secondary | ICD-10-CM | POA: Diagnosis not present

## 2020-01-13 LAB — LIPID PANEL
Chol/HDL Ratio: 2.8 ratio (ref 0.0–4.4)
Cholesterol, Total: 199 mg/dL (ref 100–199)
HDL: 71 mg/dL (ref >39)
LDL Chol Calc (NIH): 109 mg/dL — ABNORMAL HIGH (ref 0–99)
Triglycerides: 109 mg/dL (ref 0–149)
VLDL Cholesterol Cal: 19 mg/dL (ref 5–40)

## 2020-01-13 LAB — COMPREHENSIVE METABOLIC PANEL
ALT: 29 IU/L (ref 0–32)
AST: 27 IU/L (ref 0–40)
Albumin/Globulin Ratio: 2.2 (ref 1.2–2.2)
Albumin: 4.8 g/dL — ABNORMAL HIGH (ref 3.7–4.7)
Alkaline Phosphatase: 63 IU/L (ref 48–121)
BUN/Creatinine Ratio: 28 (ref 12–28)
BUN: 30 mg/dL — ABNORMAL HIGH (ref 8–27)
Bilirubin Total: 0.4 mg/dL (ref 0.0–1.2)
CO2: 26 mmol/L (ref 20–29)
Calcium: 9.8 mg/dL (ref 8.7–10.3)
Chloride: 101 mmol/L (ref 96–106)
Creatinine, Ser: 1.06 mg/dL — ABNORMAL HIGH (ref 0.57–1.00)
GFR calc Af Amer: 60 mL/min/{1.73_m2} (ref >59)
GFR calc non Af Amer: 52 mL/min/{1.73_m2} — ABNORMAL LOW (ref >59)
Globulin, Total: 2.2 g/dL (ref 1.5–4.5)
Glucose: 101 mg/dL — ABNORMAL HIGH (ref 65–99)
Potassium: 5.3 mmol/L — ABNORMAL HIGH (ref 3.5–5.2)
Sodium: 139 mmol/L (ref 134–144)
Total Protein: 7 g/dL (ref 6.0–8.5)

## 2020-01-13 NOTE — Patient Instructions (Signed)
Medication Instructions:  Your physician recommends that you continue on your current medications as directed. Please refer to the Current Medication list given to you today.  *If you need a refill on your cardiac medications before your next appointment, please call your pharmacy*   Lab Work: Your physician recommends that you return for lab work today: CMET, Lipid Profile  If you have labs (blood work) drawn today and your tests are completely normal, you will receive your results only by: [name] MyChart Message (if you have MyChart) OR . A paper copy in the mail If you have any lab test that is abnormal or we need to change your treatment, we will call you to review the results.  Follow-Up: At [facility], you and your health needs are our priority.  As part of our continuing mission to provide you with exceptional heart care, we have created designated Provider Care Teams.  These Care Teams include your primary Cardiologist (physician) and Advanced Practice Providers (APPs -  Physician Assistants and Nurse Practitioners) who all work together to provide you with the care you need, when you need it.  We recommend signing up for the patient portal called "MyChart".  Sign up information is provided on this After Visit Summary.  MyChart is used to connect with patients for Virtual Visits (Telemedicine).  Patients are able to view lab/test results, encounter notes, upcoming appointments, etc.  Non-urgent messages can be sent to your provider as well.   To learn more about what you can do with MyChart, go to [email or web address].    Your next appointment:   12 month(s)  The format for your next appointment:   In Person  Provider:   You may see Mertie Moores, MD or one of the following Advanced Practice Providers on your designated Care Team:    Richardson Dopp, PA-C  [facility] [place], [place]    Other Instructions  You have been cleared for your back surgery.  Please call our office if you  are having symptoms.  Please call 2 months in advance to schedule your follow-up appointment.

## 2020-01-14 ENCOUNTER — Other Ambulatory Visit: Payer: Self-pay | Admitting: Cardiovascular Disease

## 2020-01-14 ENCOUNTER — Other Ambulatory Visit: Payer: Self-pay | Admitting: Family Medicine

## 2020-01-18 ENCOUNTER — Other Ambulatory Visit: Payer: Self-pay | Admitting: Physician Assistant

## 2020-01-18 MED ORDER — SPIRONOLACTONE 25 MG PO TABS: 12.5000 mg | ORAL_TABLET | Freq: Every day | ORAL | 1 refills | Status: DC

## 2020-02-15 DIAGNOSIS — M5416 Radiculopathy, lumbar region: Secondary | ICD-10-CM | POA: Diagnosis not present

## 2020-02-15 DIAGNOSIS — M4316 Spondylolisthesis, lumbar region: Secondary | ICD-10-CM | POA: Diagnosis not present

## 2020-02-29 DIAGNOSIS — Z79890 Hormone replacement therapy (postmenopausal): Secondary | ICD-10-CM | POA: Diagnosis not present

## 2020-02-29 DIAGNOSIS — Z1211 Encounter for screening for malignant neoplasm of colon: Secondary | ICD-10-CM | POA: Diagnosis not present

## 2020-02-29 DIAGNOSIS — Z13820 Encounter for screening for osteoporosis: Secondary | ICD-10-CM | POA: Diagnosis not present

## 2020-02-29 DIAGNOSIS — Z1231 Encounter for screening mammogram for malignant neoplasm of breast: Secondary | ICD-10-CM | POA: Diagnosis not present

## 2020-02-29 DIAGNOSIS — Z01419 Encounter for gynecological examination (general) (routine) without abnormal findings: Secondary | ICD-10-CM | POA: Diagnosis not present

## 2020-03-02 DIAGNOSIS — Z01818 Encounter for other preprocedural examination: Secondary | ICD-10-CM | POA: Diagnosis not present

## 2020-03-02 DIAGNOSIS — J45909 Unspecified asthma, uncomplicated: Secondary | ICD-10-CM | POA: Diagnosis not present

## 2020-03-02 DIAGNOSIS — M47816 Spondylosis without myelopathy or radiculopathy, lumbar region: Secondary | ICD-10-CM | POA: Diagnosis not present

## 2020-03-02 DIAGNOSIS — M5136 Other intervertebral disc degeneration, lumbar region: Secondary | ICD-10-CM | POA: Diagnosis not present

## 2020-03-02 DIAGNOSIS — I10 Essential (primary) hypertension: Secondary | ICD-10-CM | POA: Diagnosis not present

## 2020-03-02 DIAGNOSIS — I5022 Chronic systolic (congestive) heart failure: Secondary | ICD-10-CM | POA: Diagnosis not present

## 2020-03-09 DIAGNOSIS — M48061 Spinal stenosis, lumbar region without neurogenic claudication: Secondary | ICD-10-CM | POA: Diagnosis not present

## 2020-03-09 DIAGNOSIS — I10 Essential (primary) hypertension: Secondary | ICD-10-CM | POA: Diagnosis not present

## 2020-03-09 DIAGNOSIS — S39002A Unspecified injury of muscle, fascia and tendon of lower back, initial encounter: Secondary | ICD-10-CM | POA: Diagnosis not present

## 2020-03-09 DIAGNOSIS — E7800 Pure hypercholesterolemia, unspecified: Secondary | ICD-10-CM | POA: Diagnosis not present

## 2020-03-21 ENCOUNTER — Encounter: Payer: PPO | Admitting: Family Medicine

## 2020-04-01 DIAGNOSIS — Z01818 Encounter for other preprocedural examination: Secondary | ICD-10-CM | POA: Diagnosis not present

## 2020-04-01 DIAGNOSIS — Z20822 Contact with and (suspected) exposure to covid-19: Secondary | ICD-10-CM | POA: Diagnosis not present

## 2020-04-02 DIAGNOSIS — I429 Cardiomyopathy, unspecified: Secondary | ICD-10-CM | POA: Diagnosis not present

## 2020-04-02 DIAGNOSIS — M5416 Radiculopathy, lumbar region: Secondary | ICD-10-CM | POA: Diagnosis not present

## 2020-04-02 DIAGNOSIS — M19042 Primary osteoarthritis, left hand: Secondary | ICD-10-CM | POA: Diagnosis not present

## 2020-04-02 DIAGNOSIS — M5116 Intervertebral disc disorders with radiculopathy, lumbar region: Secondary | ICD-10-CM | POA: Diagnosis not present

## 2020-04-02 DIAGNOSIS — M19041 Primary osteoarthritis, right hand: Secondary | ICD-10-CM | POA: Diagnosis not present

## 2020-04-02 DIAGNOSIS — I110 Hypertensive heart disease with heart failure: Secondary | ICD-10-CM | POA: Diagnosis not present

## 2020-04-02 DIAGNOSIS — Z20822 Contact with and (suspected) exposure to covid-19: Secondary | ICD-10-CM | POA: Diagnosis not present

## 2020-04-02 DIAGNOSIS — Z87891 Personal history of nicotine dependence: Secondary | ICD-10-CM | POA: Diagnosis not present

## 2020-04-02 DIAGNOSIS — J45909 Unspecified asthma, uncomplicated: Secondary | ICD-10-CM | POA: Diagnosis not present

## 2020-04-02 DIAGNOSIS — Z86718 Personal history of other venous thrombosis and embolism: Secondary | ICD-10-CM | POA: Diagnosis not present

## 2020-04-02 DIAGNOSIS — M48061 Spinal stenosis, lumbar region without neurogenic claudication: Secondary | ICD-10-CM | POA: Diagnosis not present

## 2020-04-02 DIAGNOSIS — Z79899 Other long term (current) drug therapy: Secondary | ICD-10-CM | POA: Diagnosis not present

## 2020-04-02 DIAGNOSIS — I5022 Chronic systolic (congestive) heart failure: Secondary | ICD-10-CM | POA: Diagnosis not present

## 2020-04-02 DIAGNOSIS — M4726 Other spondylosis with radiculopathy, lumbar region: Secondary | ICD-10-CM | POA: Diagnosis not present

## 2020-04-02 DIAGNOSIS — Z85828 Personal history of other malignant neoplasm of skin: Secondary | ICD-10-CM | POA: Diagnosis not present

## 2020-04-02 DIAGNOSIS — M4316 Spondylolisthesis, lumbar region: Secondary | ICD-10-CM | POA: Diagnosis not present

## 2020-04-02 HISTORY — PX: SPINE SURGERY: SHX786

## 2020-04-11 ENCOUNTER — Ambulatory Visit (INDEPENDENT_AMBULATORY_CARE_PROVIDER_SITE_OTHER): Payer: PPO | Admitting: Family Medicine

## 2020-04-11 ENCOUNTER — Other Ambulatory Visit: Payer: Self-pay

## 2020-04-11 ENCOUNTER — Encounter: Payer: Self-pay | Admitting: Family Medicine

## 2020-04-11 VITALS — BP 130/68 | HR 76 | Temp 98.6°F | Ht 62.0 in | Wt 166.0 lb

## 2020-04-11 DIAGNOSIS — Z23 Encounter for immunization: Secondary | ICD-10-CM

## 2020-04-11 DIAGNOSIS — Z0000 Encounter for general adult medical examination without abnormal findings: Secondary | ICD-10-CM | POA: Diagnosis not present

## 2020-04-11 MED ORDER — MELOXICAM 15 MG PO TABS: 15.0000 mg | ORAL_TABLET | Freq: Every day | ORAL | 3 refills | Status: DC

## 2020-04-11 NOTE — Progress Notes (Signed)
   Subjective:    Patient ID: Tina May, female    DOB: Oct 14, 1945, 74 y.o.   MRN: [id number]  HPI Here for a well exam. She feels great. She had lumbar spine at [facility] a few weeks ago, and this went very well. She hopes to begin PT sometime soon.    Review of Systems  Constitutional: Negative.   HENT: Negative.   Eyes: Negative.   Respiratory: Negative.   Cardiovascular: Negative.   Gastrointestinal: Negative.   Genitourinary: Negative for decreased urine volume, difficulty urinating, dyspareunia, dysuria, enuresis, flank pain, frequency, hematuria, pelvic pain and urgency.  Musculoskeletal: Negative.   Skin: Negative.   Neurological: Negative.   Psychiatric/Behavioral: Negative.        Objective:   Physical Exam Constitutional:      General: She is not in acute distress.    Appearance: Normal appearance. She is well-developed.  HENT:     Head: Normocephalic and atraumatic.     Right Ear: External ear normal.     Left Ear: External ear normal.     Nose: Nose normal.     Mouth/Throat:     Pharynx: No oropharyngeal exudate.  Eyes:     General: No scleral icterus.    Conjunctiva/sclera: Conjunctivae normal.     Pupils: Pupils are equal, round, and reactive to light.  Neck:     Thyroid: No thyromegaly.     Vascular: No JVD.  Cardiovascular:     Rate and Rhythm: Normal rate and regular rhythm.     Heart sounds: Normal heart sounds. No murmur heard.  No friction rub. No gallop.   Pulmonary:     Effort: Pulmonary effort is normal. No respiratory distress.     Breath sounds: Normal breath sounds. No wheezing or rales.  Chest:     Chest wall: No tenderness.  Abdominal:     General: Bowel sounds are normal. There is no distension.     Palpations: Abdomen is soft. There is no mass.     Tenderness: There is no abdominal tenderness. There is no guarding or rebound.  Musculoskeletal:        General: No tenderness. Normal range of motion.     Cervical back: Normal range  of motion and neck supple.  Lymphadenopathy:     Cervical: No cervical adenopathy.  Skin:    General: Skin is warm and dry.     Findings: No erythema or rash.  Neurological:     Mental Status: She is alert and oriented to person, place, and time.     Cranial Nerves: No cranial nerve deficit.     Motor: No abnormal muscle tone.     Coordination: Coordination normal.     Deep Tendon Reflexes: Reflexes are normal and symmetric. Reflexes normal.  Psychiatric:        Behavior: Behavior normal.        Thought Content: Thought content normal.        Judgment: Judgment normal.           Assessment & Plan:  Well exam. We discussed diet and exercise. Get fasting labs.  [name], MD

## 2020-04-11 NOTE — Addendum Note (Signed)
Addended by: Otilio Miu on: 04/11/2020 09:08 AM   Modules accepted: Orders

## 2020-04-12 LAB — CBC WITH DIFFERENTIAL/PLATELET
Absolute Monocytes: 842 cells/uL (ref 200–950)
Basophils Absolute: 81 cells/uL (ref 0–200)
Basophils Relative: 1 %
Eosinophils Absolute: 267 cells/uL (ref 15–500)
Eosinophils Relative: 3.3 %
HCT: 38.5 % (ref 35.0–45.0)
Hemoglobin: 13.1 g/dL (ref 11.7–15.5)
Lymphs Abs: 1369 cells/uL (ref 850–3900)
MCH: 32.8 pg (ref 27.0–33.0)
MCHC: 34 g/dL (ref 32.0–36.0)
MCV: 96.5 fL (ref 80.0–100.0)
MPV: 9.9 fL (ref 7.5–12.5)
Monocytes Relative: 10.4 %
Neutro Abs: 5540 cells/uL (ref 1500–7800)
Neutrophils Relative %: 68.4 %
Platelets: 362 10*3/uL (ref 140–400)
RBC: 3.99 10*6/uL (ref 3.80–5.10)
RDW: 12.5 % (ref 11.0–15.0)
Total Lymphocyte: 16.9 %
WBC: 8.1 10*3/uL (ref 3.8–10.8)

## 2020-04-12 LAB — BASIC METABOLIC PANEL WITH GFR
BUN: 24 mg/dL (ref 7–25)
CO2: 32 mmol/L (ref 20–32)
Calcium: 9.6 mg/dL (ref 8.6–10.4)
Chloride: 99 mmol/L (ref 98–110)
Creat: 0.87 mg/dL (ref 0.60–0.93)
GFR, Est African American: 76 mL/min/{1.73_m2} (ref >=60)
GFR, Est Non African American: 66 mL/min/{1.73_m2} (ref >=60)
Glucose, Bld: 111 mg/dL — ABNORMAL HIGH (ref 65–99)
Potassium: 5.1 mmol/L (ref 3.5–5.3)
Sodium: 139 mmol/L (ref 135–146)

## 2020-04-12 LAB — HEMOGLOBIN A1C
Hgb A1c MFr Bld: 5.6 % of total Hgb (ref <5.7)
Mean Plasma Glucose: 114 mg/dL
eAG (mmol/L): 6.3 mmol/L

## 2020-04-12 LAB — HEPATIC FUNCTION PANEL
AG Ratio: 1.8 (calc) (ref 1.0–2.5)
ALT: 13 U/L (ref 6–29)
AST: 16 U/L (ref 10–35)
Albumin: 4.2 g/dL (ref 3.6–5.1)
Alkaline phosphatase (APISO): 57 U/L (ref 37–153)
Bilirubin, Direct: 0.1 mg/dL (ref 0.0–0.2)
Globulin: 2.4 g/dL (calc) (ref 1.9–3.7)
Indirect Bilirubin: 0.3 mg/dL (calc) (ref 0.2–1.2)
Total Bilirubin: 0.4 mg/dL (ref 0.2–1.2)
Total Protein: 6.6 g/dL (ref 6.1–8.1)

## 2020-04-12 LAB — LIPID PANEL
Cholesterol: 214 mg/dL — ABNORMAL HIGH (ref <200)
HDL: 63 mg/dL (ref >=50)
LDL Cholesterol (Calc): 122 mg/dL (calc) — ABNORMAL HIGH
Non-HDL Cholesterol (Calc): 151 mg/dL (calc) — ABNORMAL HIGH (ref <130)
Total CHOL/HDL Ratio: 3.4 (calc) (ref <5.0)
Triglycerides: 170 mg/dL — ABNORMAL HIGH (ref <150)

## 2020-04-12 LAB — TSH: TSH: 1.45 mIU/L (ref 0.40–4.50)

## 2020-04-18 DIAGNOSIS — Z4889 Encounter for other specified surgical aftercare: Secondary | ICD-10-CM | POA: Diagnosis not present

## 2020-05-30 DIAGNOSIS — Z5189 Encounter for other specified aftercare: Secondary | ICD-10-CM | POA: Diagnosis not present

## 2020-05-30 DIAGNOSIS — Z9889 Other specified postprocedural states: Secondary | ICD-10-CM | POA: Diagnosis not present

## 2020-06-06 DIAGNOSIS — Z9889 Other specified postprocedural states: Secondary | ICD-10-CM | POA: Diagnosis not present

## 2020-06-06 DIAGNOSIS — Z5189 Encounter for other specified aftercare: Secondary | ICD-10-CM | POA: Diagnosis not present

## 2020-07-24 ENCOUNTER — Telehealth: Payer: Self-pay | Admitting: Pharmacist

## 2020-07-24 NOTE — Chronic Care Management (AMB) (Signed)
I left the patient a message about her upcoming appointment on 07/25/2020 @ 9:00 am with the clinical pharmacist. She was asked to please have all medication on hand to review with the pharmacist.  Neita Goodnight) Mare Ferrari, [place] Assistant [phone]

## 2020-07-25 ENCOUNTER — Ambulatory Visit (INDEPENDENT_AMBULATORY_CARE_PROVIDER_SITE_OTHER): Payer: PPO | Admitting: Pharmacist

## 2020-07-25 DIAGNOSIS — E785 Hyperlipidemia, unspecified: Secondary | ICD-10-CM | POA: Diagnosis not present

## 2020-07-25 DIAGNOSIS — I5021 Acute systolic (congestive) heart failure: Secondary | ICD-10-CM

## 2020-07-25 DIAGNOSIS — I10 Essential (primary) hypertension: Secondary | ICD-10-CM

## 2020-07-25 MED ORDER — EZETIMIBE 10 MG PO TABS: 10.0000 mg | ORAL_TABLET | Freq: Every day | ORAL | 3 refills | Status: DC

## 2020-07-25 NOTE — Patient Instructions (Addendum)
Hi Tina May,   It was great to get to meet you over the telephone! Below is a summary of some of the topics we discussed.   Dr. Sarajane May agreed about the Zetia and sent in a prescription for it to [facility].  Please reach out to me if you have any questions or need anything before our follow up which I scheduled for September.  Best, Tina May  Tina May, PharmD, [facility]  Visit Information  Goals Addressed   None    Patient Care Plan: CCM Pharmacy Care Plan    Problem Identified: Problem: Hypertension, Hyperlipidemia, Heart Failure, Asthma and low back pain, post menopausal symptoms, dry eyes     Long-Range Goal: Patient-Specific Goal   Start Date: 07/25/2020  Expected End Date: 07/25/2021  This Visit's Progress: On track  Priority: High  Note:   Current Barriers:  . Unable to independently monitor therapeutic efficacy . Unable to achieve control of cholesterol  . Suboptimal therapeutic regimen for cholesterol  Pharmacist Clinical Goal(s):  [name] Patient will achieve adherence to monitoring guidelines and medication adherence to achieve therapeutic efficacy . achieve control of cholesterol as evidenced by next cholesterol panl . maintain control of blood pressure as evidenced by home blood pressure readings  through collaboration with PharmD and provider.   Interventions: . 1:1 collaboration with Tina Morale, MD regarding development and update of comprehensive plan of care as evidenced by provider attestation and co-signature . Inter-disciplinary care team collaboration (see longitudinal plan of care) . Comprehensive medication review performed; medication list updated in electronic medical record  Hypertension (BP goal <140/90) -Controlled -Current treatment:  bisoprolol 5mg , 1 tablet once daily   spironolactone 25mg , 1/2 tablet once daily   -Medications previously tried: 2 tablet once daily  -Medications  previously tried: amlodipine (stomach cramps, diarrhea), diltiazem, valsartan -Current home readings: 100/60s (every once in awhile drops to the 40s) but hasn't checked in the last month -Current dietary habits: doesn't add salt anymore and has cut out carbs -Current exercise habits: very active with golf several days a week -Denies hypotensive/hypertensive symptoms -Educated on Daily salt intake goal < 2300 mg; Exercise goal of 150 minutes per week; Importance of home blood pressure monitoring; -Counseled to monitor BP at home weekly, document, and provide log at future appointments -Counseled on diet and exercise extensively Recommended to continue current medication  Hyperlipidemia: (LDL goal < 100) -Uncontrolled -Current treatment: . rosuvastatin 5mg , 1 tablet once daily  -Medications previously tried: atorvastatin (muscle cramps), Zetia (unknown) -Current dietary patterns: mostly eating salads and fish and only eats bacon once a week; never eats fried foods and does not eat processed or canned foods -Current exercise habits: very active with golf -Educated on Cholesterol goals;  Importance of limiting foods high in cholesterol; Exercise goal of 150 minutes per week; -Counseled on diet and exercise extensively Recommended to continue current medication Recommended Zetia for additional LDL lowering  Heart Failure (Goal: manage symptoms and prevent exacerbations) -Controlled -Last ejection fraction:  45-50% (Date: 04/22/2018) -HF type: Systolic -NYHA Class: I (no actitivty limitation) -Current treatment:  bisoprolol 5mg , 1 tablet once daily   spironolactone 25mg , 1/2 tablet once daily   Entresto 97-103mg , 1 tablet twice daily ($150 per month) -Medications previously tried: carvedilol, metoprolol -Current home BP/HR readings: 100/60s (every once in awhile drops to the 40s) but hasn't checked in the last month -Current dietary habits: limiting salt intake -Current exercise  habits: very active with golf -Educated on  Importance of blood pressure control -Recommended to continue current medication  Asthma (Goal: control symptoms) -Controlled -Current treatment  . albuterol 198mcg/ act inhaler, inhale 2 puffs every four hours as needed for wheezing or shortness of breath  -Medications previously tried: Flovent  -Patient denies consistent use of maintenance inhaler -Frequency of rescue inhaler use: using more lately but never > once per day (inhaler usually lasts a year or more) -Counseled on When to use rescue inhaler -Recommended to continue current medication  Low back pain (Goal: minimize pain) -Controlled -Current treatment   Steroid injection   meloxicam 15mg , 1 tablet once daily -Medications previously tried: none  -Recommended to continue current medication  Post-menopausal symptoms (Goal: minimize hot flashes and sweating) -Controlled -Current treatment  . Premarin 0.625 mg 1 tablet weekly -Medications previously tried: non-hormonal options (ineffective)  - Patient is aware of the risks but is only taking once weekly and tapering in the past was ineffective.   Health Maintenance -Vaccine gaps: pneumonia (does not want to get) -Current therapy:  . Hyaluronic acid-vitamin C daily . Fiber select gummies daily . Multivitamin daily . Turmeric daily -Educated on Cost vs benefit of each product must be carefully weighed by individual consumer -Patient is satisfied with current therapy and denies issues -Recommended to continue current medication  Patient Goals/Self-Care Activities . Patient will:  - take medications as prescribed check blood pressure weekly, document, and provide at future appointments target a minimum of 150 minutes of moderate intensity exercise weekly  Follow Up Plan: Telephone follow up appointment with care management team member scheduled for: 6 months       Patient verbalizes understanding of instructions  provided today and agrees to view in [place].  Telephone follow up appointment with pharmacy team member scheduled for:  Tina May, [facility]  Dyslipidemia Dyslipidemia is an imbalance of waxy, fat-like substances (lipids) in the blood. The body needs lipids in small amounts. Dyslipidemia often involves a high level of cholesterol or triglycerides, which are types of lipids. Common forms of dyslipidemia include:  High levels of LDL cholesterol. LDL is the type of cholesterol that causes fatty deposits (plaques) to build up in the blood vessels that carry blood away from your heart (arteries).  Low levels of HDL cholesterol. HDL cholesterol is the type of cholesterol that protects against heart disease. High levels of HDL remove the LDL buildup from arteries.  High levels of triglycerides. Triglycerides are a fatty substance in the blood that is linked to a buildup of plaques in the arteries. What are the causes? Primary dyslipidemia is caused by changes (mutations) in genes that are passed down through families (inherited). These mutations cause several types of dyslipidemia. Secondary dyslipidemia is caused by lifestyle choices and diseases that lead to dyslipidemia, such as:  Eating a diet that is high in animal fat.  Not getting enough exercise.  Having diabetes, kidney disease, liver disease, or thyroid disease.  Drinking large amounts of alcohol.  Using certain medicines. What increases the risk? You are more likely to develop this condition if you are an older man or if you are a woman who has gone through menopause. Other risk factors include:  Having a family history of dyslipidemia.  Taking certain medicines, including birth control pills, steroids, some diuretics, and beta-blockers.  Smoking cigarettes.  Eating a high-fat diet.  Having certain medical conditions such as diabetes, polycystic ovary syndrome (PCOS), kidney disease, liver disease, or  hypothyroidism.  Not exercising regularly.  Being overweight or  obese with too much belly fat. What are the signs or symptoms? In most cases, dyslipidemia does not usually cause any symptoms. In severe cases, very high lipid levels can cause:  Fatty bumps under the skin (xanthomas).  White or gray ring around the black center (pupil) of the eye. Very high triglyceride levels can cause inflammation of the pancreas (pancreatitis). How is this diagnosed? Your health care provider may diagnose dyslipidemia based on a routine blood test (fasting blood test). Because most people do not have symptoms of the condition, this blood testing (lipid profile) is done on adults age 25 and older and is repeated every 5 years. This test checks:  Total cholesterol. This measures the total amount of cholesterol in your blood, including LDL cholesterol, HDL cholesterol, and triglycerides. A healthy number is below 200.  LDL cholesterol. The target number for LDL cholesterol is different for each person, depending on individual risk factors. Ask your health care provider what your LDL cholesterol should be.  HDL cholesterol. An HDL level of 60 or higher is best because it helps to protect against heart disease. A number below 76 for men or below 45 for women increases the risk for heart disease.  Triglycerides. A healthy triglyceride number is below 150. If your lipid profile is abnormal, your health care provider may do other blood tests.   How is this treated? Treatment depends on the type of dyslipidemia that you have and your other risk factors for heart disease and stroke. Your health care provider will have a target range for your lipid levels based on this information. For many people, this condition may be treated by lifestyle changes, such as diet and exercise. Your health care provider may recommend that you:  Get regular exercise.  Make changes to your diet.  Quit smoking if you smoke. If diet  changes and exercise do not help you reach your goals, your health care provider may also prescribe medicine to lower lipids. The most commonly prescribed type of medicine lowers your LDL cholesterol (statin drug). If you have a high triglyceride level, your provider may prescribe another type of drug (fibrate) or an omega-3 fish oil supplement, or both. Follow these instructions at home: Eating and drinking  Follow instructions from your health care provider or dietitian about eating or drinking restrictions.  Eat a healthy diet as told by your health care provider. This can help you reach and maintain a healthy weight, lower your LDL cholesterol, and raise your HDL cholesterol. This may include: ? Limiting your calories, if you are overweight. ? Eating more fruits, vegetables, whole grains, fish, and lean meats. ? Limiting saturated fat, trans fat, and cholesterol.  If you drink alcohol: ? Limit how much you use. ? Be aware of how much alcohol is in your drink. In the [place] one drink equals one 12 oz bottle of beer (355 mL), one 5 oz glass of wine (148 mL), or one 1 oz glass of hard liquor (44 mL).  Do not drink alcohol if: ? Your health care provider tells you not to drink. ? You are pregnant, may be pregnant, or are planning to become pregnant. Activity  Get regular exercise. Start an exercise and strength training program as told by your health care provider. Ask your health care provider what activities are safe for you. Your health care provider may recommend: ? 30 minutes of aerobic activity 4-6 days a week. Brisk walking is an example of aerobic activity. ? Strength training 2  days a week. General instructions  Do not use any products that contain nicotine or tobacco, such as cigarettes, e-cigarettes, and chewing tobacco. If you need help quitting, ask your health care provider.  Take over-the-counter and prescription medicines only as told by your health care provider. This  includes supplements.  Keep all follow-up visits as told by your health care provider.   Contact a health care provider if:  You are: ? Having trouble sticking to your exercise or diet plan. ? Struggling to quit smoking or control your use of alcohol. Summary  Dyslipidemia often involves a high level of cholesterol or triglycerides, which are types of lipids.  Treatment depends on the type of dyslipidemia that you have and your other risk factors for heart disease and stroke.  For many people, treatment starts with lifestyle changes, such as diet and exercise.  Your health care provider may prescribe medicine to lower lipids. This information is not intended to replace advice given to you by your health care provider. Make sure you discuss any questions you have with your health care provider. Document Revised: 12/14/2017 Document Reviewed: 11/20/2017 Elsevier Patient Education  [place].

## 2020-07-25 NOTE — Progress Notes (Signed)
Chronic Care Management Pharmacy Note  07/25/2020 Name:  Tina May MRN:  [id number] DOB:  Jan 23, 1946  Subjective: Tina May is an 75 y.o. year old female who is a primary patient of [name], MD.  The CCM team was consulted for assistance with disease management and care coordination needs.    Engaged with patient by telephone for follow up visit in response to provider referral for pharmacy case management and/or care coordination services.   Consent to Services:  The patient was given information about Chronic Care Management services, agreed to services, and gave verbal consent prior to initiation of services.  Please see initial visit note for detailed documentation.   Patient Care Team: [name], MD as PCP - General Nahser, Wonda Cheng, MD as PCP - Cardiology (Cardiology) Viona Gilmore, [facility] as Pharmacist (Pharmacist)  Recent office visits: 04/11/20 [name], MD: Patient presented for annual exam. No medication changes made. LDL increased. Influenza vaccine administered.  Recent consult visits: 05/30/20 Patient presented for PT session.  03/31/20 [name] (OBGYN): Patient presented for mammogram.  02/15/20 [name], MD (spinal): Patient presented for initial consult for spinal surgery.  01/13/20 [name], PA-C (cardiology): Patient presented for CHF follow up. Decreased spironolactone to 1/2 tab due to increase in K.  Hospital visits: 04/02/20 Patient admitted for spine procedure.  03/09/20 Patient admitted to the ED for spinal pain and hypertension.  Objective:  Lab Results  Component Value Date   CREATININE 0.87 04/11/2020   BUN 24 04/11/2020   GFR 69.28 10/22/2018   GFRNONAA 66 04/11/2020   GFRAA 76 04/11/2020   NA 139 04/11/2020   K 5.1 04/11/2020   CALCIUM 9.6 04/11/2020   CO2 32 04/11/2020   GLUCOSE 111 (H) 04/11/2020    Lab Results  Component Value Date/Time   HGBA1C 5.6 04/11/2020 08:56 AM   HGBA1C 5.8 (H)  10/26/2016 04:10 PM   GFR 69.28 10/22/2018 11:39 AM   GFR 72.82 09/11/2017 08:14 AM    Last diabetic Eye exam: No results found for: HMDIABEYEEXA  Last diabetic Foot exam: No results found for: HMDIABFOOTEX   Lab Results  Component Value Date   CHOL 214 (H) 04/11/2020   HDL 63 04/11/2020   LDLCALC 122 (H) 04/11/2020   LDLDIRECT 113.0 09/11/2017   TRIG 170 (H) 04/11/2020   CHOLHDL 3.4 04/11/2020    Hepatic Function Latest Ref Rng & Units 04/11/2020 01/13/2020 02/14/2019  Total Protein 6.1 - 8.1 g/dL 6.6 7.0 6.6  Albumin 3.7 - 4.7 g/dL - 4.8(H) 4.5  AST 10 - 35 U/L $Remo'16 27 21  'btSAF$ ALT 6 - 29 U/L $Remo'13 29 23  'JYecs$ Alk Phosphatase 48 - 121 IU/L - 63 61  Total Bilirubin 0.2 - 1.2 mg/dL 0.4 0.4 0.3  Bilirubin, Direct 0.0 - 0.2 mg/dL 0.1 - 0.08    Lab Results  Component Value Date/Time   TSH 1.45 04/11/2020 08:56 AM   TSH 0.804 09/20/2018 11:11 AM    CBC Latest Ref Rng & Units 04/11/2020 01/11/2019 10/27/2018  WBC 3.8 - 10.8 Thousand/uL 8.1 7.1 6.3  Hemoglobin 11.7 - 15.5 g/dL 13.1 13.4 13.0  Hematocrit 35.0 - 45.0 % 38.5 41.8 40.7  Platelets 140 - 400 Thousand/uL 362 234 222    No results found for: VD25OH  Clinical ASCVD: No  The 10-year ASCVD risk score Mikey Bussing DC Jr., et al., 2013) is: 18.3%   Values used to calculate the score:     Age: 65 years  Sex: Female     Is Non-Hispanic African American: No     Diabetic: No     Tobacco smoker: No     Systolic Blood Pressure: 379 mmHg     Is BP treated: Yes     HDL Cholesterol: 63 mg/dL     Total Cholesterol: 214 mg/dL    Depression screen [facility] 2/9 04/11/2020 02/19/2018 10/20/2016  Decreased Interest 0 0 0  Down, Depressed, Hopeless 0 0 0  PHQ - 2 Score 0 0 0      Social History   Tobacco Use  Smoking Status Former Smoker  . Packs/day: 1.00  . Years: 20.00  . Pack years: 20.00  . Types: Cigarettes  . Quit date: 05/06/1991  . Years since quitting: 29.2  Smokeless Tobacco Never Used   BP Readings from Last 3 Encounters:  04/11/20  130/68  01/13/20 120/72  09/09/19 114/68   Pulse Readings from Last 3 Encounters:  04/11/20 76  01/13/20 60  09/09/19 60   Wt Readings from Last 3 Encounters:  04/11/20 166 lb (75.3 kg)  01/13/20 162 lb 3.2 oz (73.6 kg)  09/09/19 160 lb 8 oz (72.8 kg)   BMI Readings from Last 3 Encounters:  04/11/20 30.36 kg/m  01/13/20 29.67 kg/m  09/09/19 29.36 kg/m    Assessment/Interventions: Review of patient past medical history, allergies, medications, health status, including review of consultants reports, laboratory and other test data, was performed as part of comprehensive evaluation and provision of chronic care management services.   SDOH:  (Social Determinants of Health) assessments and interventions performed: Yes SDOH Interventions   Flowsheet Row Most Recent Value  SDOH Interventions   Financial Strain Interventions Intervention Not Indicated      CCM Care Plan  Allergies  Allergen Reactions  . Amlodipine Diarrhea    And stomach cramps  . Atorvastatin Other (See Comments)     muscle cramps  muscle cramps REACTION: muscle cramps  . Diclofenac Nausea And Vomiting  . Morphine And Related Nausea And Vomiting    Medications Reviewed Today    Reviewed by [name], MD (Physician) on 04/11/20 at [phone]  Med List Status: <None>  Medication Order Taking? Sig Documenting Provider Last Dose Status Informant  acetaminophen (TYLENOL) 325 MG tablet [id number] Yes Take by mouth. [provider] Taking Active   albuterol [facility] HFA) 108 (90 Base) MCG/ACT inhaler [id number] Yes INHALE 2 PUFFS INTO THE LUNGS EVERY 4 (FOUR) HOURS AS NEEDED FOR WHEEZING OR SHORTNESS OF BREATH. [name], MD Taking Active   bisoprolol (ZEBETA) 5 MG tablet [id number] Yes TAKE 1 TABLET BY MOUTH EVERY DAY Nahser, Wonda Cheng, MD Taking Active   ENTRESTO 97-103 [facility] [id number] Yes TAKE 1 TABLET BY MOUTH TWICE A DAY Nahser, Wonda Cheng, MD Taking Active   FIBER SELECT [place] [id number] Yes Chew  2 tablets by mouth daily. Total daily dose $RemoveBe'10mg'ndxPPHKvP$ . [provider] Taking Active Self  Hyaluronic Acid-Vitamin C (HYALURONIC ACID PO) [id number] Yes Take 1 tablet by mouth daily. Women's Hyaluronic Acid [provider] Taking Active Self  meloxicam (MOBIC) 15 MG tablet [id number] Yes TAKE 1 TABLET BY MOUTH EVERY DAY [name], MD Taking Active   Multiple Vitamin (MULTIVITAMIN WITH MINERALS) TABS tablet [id number] Yes Take 1 tablet by mouth daily. One-A-Day [provider] Taking Active Self  PREMARIN 0.625 MG tablet [id number] Yes Take 0.625 mg by mouth daily. [provider] Taking Active   RESTASIS 0.05 % ophthalmic emulsion [id number] Yes  Place 1 drop into both eyes 2 (two) times daily.  [provider] Taking Active Self           Med Note Nyoka Cowden, FELICIA D   Fri Sep 08, 2014  4:50 PM)     rosuvastatin (CRESTOR) 5 MG tablet [id number] Yes TAKE 1 TABLET BY MOUTH EVERY DAY [name], MD Taking Active   spironolactone (ALDACTONE) 25 MG tablet [id number] Yes Take 0.5 tablets (12.5 mg total) by mouth daily. Barrett, Felisa Bonier Taking Active   TURMERIC CURCUMIN PO [id number] Yes Take 1,000 mg by mouth daily. [provider] Taking Active Self          Patient Active Problem List   Diagnosis Date Noted  . Low back pain 10/14/2019  . Abdominal distention 12/26/2018  . Bloating 12/26/2018  . Pancreatic lesion 12/26/2018  . Colon cancer screening 12/26/2018  . Hx of resection of small bowel 12/26/2018  . Palpitations 09/20/2018  . Chronic systolic CHF (congestive heart failure) ([place]) 01/27/2017  . Pleural effusion on right 12/03/2016  . CHF (congestive heart failure), NYHA class I, acute, systolic ([place]) 95/18/8416  . Acute CHF ([place]) 10/26/2016  . Plantar fasciitis, bilateral 03/17/2013  . ANXIETY STATE, UNSPECIFIED 03/27/2009  . Hyperlipidemia 05/04/2007  . Benign essential HTN 05/04/2007  . ALLERGIC RHINITIS 05/04/2007  . Asthma  05/04/2007  . Osteoarthritis 05/04/2007    Immunization History  Administered Date(s) Administered  . Influenza Inj Mdck Quad Pf 03/02/2017, 04/19/2018  . Influenza Split 03/04/2011, 03/12/2011  . Influenza Whole 02/11/2010  . Influenza,inj,Quad PF,6+ Mos 05/04/2015, 03/02/2017, 01/28/2019, 04/11/2020  . Influenza,inj,quad, With Preservative 03/05/2016  . Influenza-Unspecified 01/08/2016  . Moderna SARS-COV2 Booster Vaccination 03/21/2020  . Moderna Sars-Covid-2 Vaccination 07/11/2019, 08/03/2019  . Tdap 02/19/2018  Major sugery in November - played golf 10 days in a row, very active with golf in [place] and does gardening  Conditions to be addressed/monitored:  Hypertension, Hyperlipidemia, Heart Failure, Asthma and low back pain, post menopausal symptoms, dry eyes  Care Plan : CCM Pharmacy Care Plan  Updates made by Viona Gilmore, [place] since 07/25/2020 12:00 AM    Problem: Problem: Hypertension, Hyperlipidemia, Heart Failure, Asthma and low back pain, post menopausal symptoms, dry eyes     Long-Range Goal: Patient-Specific Goal   Start Date: 07/25/2020  Expected End Date: 07/25/2021  This Visit's Progress: On track  Priority: High  Note:   Current Barriers:  . Unable to independently monitor therapeutic efficacy . Unable to achieve control of cholesterol  . Suboptimal therapeutic regimen for cholesterol  Pharmacist Clinical Goal(s):  [name] Patient will achieve adherence to monitoring guidelines and medication adherence to achieve therapeutic efficacy . achieve control of cholesterol as evidenced by next cholesterol panl . maintain control of blood pressure as evidenced by home blood pressure readings  through collaboration with PharmD and provider.   Interventions: . 1:1 collaboration with [name], MD regarding development and update of comprehensive plan of care as evidenced by provider attestation and co-signature . Inter-disciplinary care team collaboration (see  longitudinal plan of care) . Comprehensive medication review performed; medication list updated in electronic medical record  Hypertension (BP goal <140/90) -Controlled -Current treatment:  bisoprolol $RemoveBef'5mg'XlkbkSbsRp$ , 1 tablet once daily   spironolactone $RemoveBeforeD'25mg'wLkQJoNDIiPyOk$ , 1/2 tablet once daily   -Medications previously tried: 2 tablet once daily  -Medications previously tried: amlodipine (stomach cramps, diarrhea), diltiazem, valsartan -Current home readings: 100/60s (every once in awhile drops to the 40s) but hasn't checked in the last  month -Current dietary habits: doesn't add salt anymore and has cut out carbs -Current exercise habits: very active with golf several days a week -Denies hypotensive/hypertensive symptoms -Educated on Daily salt intake goal < 2300 mg; Exercise goal of 150 minutes per week; Importance of home blood pressure monitoring; -Counseled to monitor BP at home weekly, document, and provide log at future appointments -Counseled on diet and exercise extensively Recommended to continue current medication  Hyperlipidemia: (LDL goal < 100) -Uncontrolled -Current treatment: . rosuvastatin $RemoveBefore'5mg'RLXuKjTOmQpnR$ , 1 tablet once daily  -Medications previously tried: atorvastatin (muscle cramps), Zetia (unknown) -Current dietary patterns: mostly eating salads and fish and only eats bacon once a week; never eats fried foods and does not eat processed or canned foods -Current exercise habits: very active with golf -Educated on Cholesterol goals;  Importance of limiting foods high in cholesterol; Exercise goal of 150 minutes per week; -Counseled on diet and exercise extensively Recommended to continue current medication Recommended Zetia for additional LDL lowering  Heart Failure (Goal: manage symptoms and prevent exacerbations) -Controlled -Last ejection fraction:  45-50% (Date: 04/22/2018) -HF type: Systolic -NYHA Class: I (no actitivty limitation) -Current treatment:  bisoprolol $RemoveBef'5mg'JlztCGLcfQ$ , 1 tablet once  daily   spironolactone $RemoveBeforeD'25mg'QBCxwFnLXQZsqY$ , 1/2 tablet once daily   Entresto 97-$RemoveBefo'103mg'vOzGDcGGmRa$ , 1 tablet twice daily ($150 per month) -Medications previously tried: carvedilol, metoprolol -Current home BP/HR readings: 100/60s (every once in awhile drops to the 40s) but hasn't checked in the last month -Current dietary habits: limiting salt intake -Current exercise habits: very active with golf -Educated on Importance of blood pressure control -Recommended to continue current medication  Asthma (Goal: control symptoms) -Controlled -Current treatment  . albuterol 182mcg/ act inhaler, inhale 2 puffs every four hours as needed for wheezing or shortness of breath  -Medications previously tried: Flovent  -Patient denies consistent use of maintenance inhaler -Frequency of rescue inhaler use: using more lately but never > once per day (inhaler usually lasts a year or more) -Counseled on When to use rescue inhaler -Recommended to continue current medication  Low back pain (Goal: minimize pain) -Controlled -Current treatment   Steroid injection   meloxicam $RemoveBe'15mg'mBTIikdti$ , 1 tablet once daily -Medications previously tried: none  -Recommended to continue current medication  Post-menopausal symptoms (Goal: minimize hot flashes and sweating) -Controlled -Current treatment  . Premarin 0.625 mg 1 tablet weekly -Medications previously tried: non-hormonal options (ineffective)  - Patient is aware of the risks but is only taking once weekly and tapering in the past was ineffective.   Health Maintenance -Vaccine gaps: pneumonia (does not want to get) -Current therapy:  . Hyaluronic acid-vitamin C daily . Fiber select gummies daily . Multivitamin daily . Turmeric daily -Educated on Cost vs benefit of each product must be carefully weighed by individual consumer -Patient is satisfied with current therapy and denies issues -Recommended to continue current medication  Patient Goals/Self-Care Activities . Patient will:  -  take medications as prescribed check blood pressure weekly, document, and provide at future appointments target a minimum of 150 minutes of moderate intensity exercise weekly  Follow Up Plan: Telephone follow up appointment with care management team member scheduled for: 6 months      Medication Assistance: None required.  Patient affirms current coverage meets needs.  Patient's preferred pharmacy is:  [id number] - [place], [place] - [address] [place] [address] Phone: [phone] Fax: [phone]  [id number] - [place], [place] [place]  [place] [address] Phone: [phone] Fax: [phone]  Uses pill box? No - sets them up on the counter Pt endorses 100% compliance  We discussed: Current pharmacy is preferred with insurance plan and patient is satisfied with pharmacy services Patient decided to: Continue current medication management strategy  Care Plan and Follow Up Patient Decision:  Patient agrees to Care Plan and Follow-up.  Plan: Telephone follow up appointment with care management team member scheduled for:  6 months  Jeni Salles, PharmD [place] Pharmacist [facility] [place] [phone]

## 2020-07-25 NOTE — Addendum Note (Signed)
Addended by: [name] A on: 07/25/2020 05:35 PM   Modules accepted: Orders

## 2020-09-08 ENCOUNTER — Other Ambulatory Visit: Payer: Self-pay | Admitting: Family Medicine

## 2020-09-12 DIAGNOSIS — L821 Other seborrheic keratosis: Secondary | ICD-10-CM | POA: Diagnosis not present

## 2020-09-12 DIAGNOSIS — Z87891 Personal history of nicotine dependence: Secondary | ICD-10-CM | POA: Diagnosis not present

## 2020-09-12 DIAGNOSIS — Z872 Personal history of diseases of the skin and subcutaneous tissue: Secondary | ICD-10-CM | POA: Diagnosis not present

## 2020-09-12 DIAGNOSIS — D1801 Hemangioma of skin and subcutaneous tissue: Secondary | ICD-10-CM | POA: Diagnosis not present

## 2020-09-12 DIAGNOSIS — D229 Melanocytic nevi, unspecified: Secondary | ICD-10-CM | POA: Diagnosis not present

## 2020-09-12 DIAGNOSIS — L812 Freckles: Secondary | ICD-10-CM | POA: Diagnosis not present

## 2020-09-12 DIAGNOSIS — L814 Other melanin hyperpigmentation: Secondary | ICD-10-CM | POA: Diagnosis not present

## 2020-09-12 DIAGNOSIS — L309 Dermatitis, unspecified: Secondary | ICD-10-CM | POA: Diagnosis not present

## 2020-09-12 DIAGNOSIS — Z808 Family history of malignant neoplasm of other organs or systems: Secondary | ICD-10-CM | POA: Diagnosis not present

## 2020-09-12 DIAGNOSIS — B078 Other viral warts: Secondary | ICD-10-CM | POA: Diagnosis not present

## 2020-09-12 DIAGNOSIS — Z85828 Personal history of other malignant neoplasm of skin: Secondary | ICD-10-CM | POA: Diagnosis not present

## 2020-09-12 DIAGNOSIS — L578 Other skin changes due to chronic exposure to nonionizing radiation: Secondary | ICD-10-CM | POA: Diagnosis not present

## 2020-09-12 DIAGNOSIS — Z1283 Encounter for screening for malignant neoplasm of skin: Secondary | ICD-10-CM | POA: Diagnosis not present

## 2020-09-17 ENCOUNTER — Ambulatory Visit: Payer: PPO

## 2020-09-17 ENCOUNTER — Other Ambulatory Visit: Payer: Self-pay

## 2020-09-17 DIAGNOSIS — Z0000 Encounter for general adult medical examination without abnormal findings: Secondary | ICD-10-CM

## 2020-09-17 NOTE — Progress Notes (Unsigned)
Subjective:   Tina May is a 75 y.o. female who presents for an Initial Medicare Annual Wellness Visit.  Review of Systems    n/a       Objective:    There were no vitals filed for this visit. There is no height or weight on file to calculate BMI.  Advanced Directives 01/13/2019 11/26/2018 10/27/2018 01/27/2017 10/26/2016 10/26/2016 09/08/2014  Does Patient Have a Medical Advance Directive? Yes Yes Yes Yes Yes No No  Type of Advance Directive Living will [place];Living will [place];Living will [place];Living will Living will - -  Does patient want to make changes to medical advance directive? No - Patient declined No - Patient declined - No - Patient declined No - Patient declined - -  Copy of [place] in Chart? - - - No - copy requested - - -  Would patient like information on creating a medical advance directive? - - - - - - No - patient declined information    Current Medications (verified) Outpatient Encounter Medications as of 09/17/2020  Medication Sig  . albuterol (PROAIR HFA) 108 (90 Base) MCG/ACT inhaler INHALE 2 PUFFS INTO THE LUNGS EVERY 4 (FOUR) HOURS AS NEEDED FOR WHEEZING OR SHORTNESS OF BREATH.  . bisoprolol (ZEBETA) 5 MG tablet TAKE 1 TABLET BY MOUTH EVERY DAY  . ENTRESTO 97-103 MG TAKE 1 TABLET BY MOUTH TWICE A DAY  . ezetimibe (ZETIA) 10 MG tablet Take 1 tablet (10 mg total) by mouth daily.  [name] FIBER SELECT GUMMIES CHEW Chew 2 tablets by mouth daily. Total daily dose 10mg .  . Hyaluronic Acid-Vitamin C (HYALURONIC ACID PO) Take 1 tablet by mouth daily. Women's Hyaluronic Acid  . meloxicam (MOBIC) 15 MG tablet Take 1 tablet (15 mg total) by mouth daily.  . Multiple Vitamin (MULTIVITAMIN WITH MINERALS) TABS tablet Take 1 tablet by mouth daily. One-A-Day  . PREMARIN 0.625 MG tablet Take 0.625 mg by mouth once a week.  . RESTASIS 0.05 % ophthalmic emulsion Place 1 drop into both eyes 2  (two) times daily.   . rosuvastatin (CRESTOR) 5 MG tablet TAKE 1 TABLET BY MOUTH EVERY DAY  . spironolactone (ALDACTONE) 25 MG tablet Take 0.5 tablets (12.5 mg total) by mouth daily.  . TURMERIC CURCUMIN PO Take 1,000 mg by mouth daily.   No facility-administered encounter medications on file as of 09/17/2020.    Allergies (verified) Amlodipine, Atorvastatin, Diclofenac, and Morphine and related   History: Past Medical History:  Diagnosis Date  . Arthritis   . Complex cyst of right ovary   . DDD (degenerative disc disease), lumbar   . Diverticulosis   . External hemorrhoids   . History of basal cell carcinoma (BCC) excision   . HTN (hypertension)   . Hyperlipidemia   . Intermittent palpitations    event monitor 10-27-2018 (Dr Acie Fredrickson)  SR including ST and SB with two episodes NSVT  . Mild asthma   . NICM (nonischemic cardiomyopathy) ([place])    Dr Acie Fredrickson, dx 06/ 2018  ef 25-30%;   stress echo 12-22-2016 no ischemia, ef 30-35%; last echo 04-22-2018 ef 45-50%; cath 01-27-2017  no sig. cad,10% pRCA to mRCA  . Pancreatic lesion 03/11/2013   last CT abd. 10-27-2018, stable  . Spinal stenosis of lumbar region   . Systolic CHF, chronic ([place]) 10/2016   followed by Dr Acie Fredrickson;  per echo 04-22-2018 ef 45-50%, G1DD   Past Surgical History:  Procedure Laterality  Date  . COLONOSCOPY  01/25/2014   per Dr. [name], diverticulae only, repeat in 10 yrs   . HAND TENDON SURGERY Right 2014  approx.  [name] HEMICOLECTOMY  2005   resection colon tumor, per pt benign  . LEFT HEART CATH AND CORONARY ANGIOGRAPHY N/A 01/27/2017   Procedure: LEFT HEART CATH AND CORONARY ANGIOGRAPHY;  Surgeon: Martinique, Peter M, MD;  Location: [facility];  Service: Cardiovascular;  Laterality: N/A;  . ROBOTIC ASSISTED SALPINGO OOPHERECTOMY Bilateral 01/13/2019   Procedure: XI ROBOTIC ASSISTED SALPINGO OOPHORECTOMY;  Surgeon: [name], MD;  Location: [facility];  Service: Gynecology;  Laterality:  Bilateral;  . SPINE SURGERY  04/02/2020   lumbar spine surgery at [facility]   . TONSILLECTOMY  age 57  . VAGINAL HYSTERECTOMY  1994   Family History  Problem Relation Age of Onset  . Hyperlipidemia Other   . Hypertension Other   . Prostate cancer Other   . Healthy Sister   . Healthy Brother   . Colon cancer Neg Hx   . Esophageal cancer Neg Hx   . Inflammatory bowel disease Neg Hx   . Liver disease Neg Hx   . Pancreatic cancer Neg Hx   . Rectal cancer Neg Hx   . Stomach cancer Neg Hx    Social History   Socioeconomic History  . Marital status: Widowed    Spouse name: Not on file  . Number of children: 3  . Years of education: Not on file  . Highest education level: Not on file  Occupational History  . Occupation: Retired  Tobacco Use  . Smoking status: Former Smoker    Packs/day: 1.00    Years: 20.00    Pack years: 20.00    Types: Cigarettes    Quit date: 05/06/1991    Years since quitting: 29.3  . Smokeless tobacco: Never Used  Vaping Use  . Vaping Use: Never used  Substance and Sexual Activity  . Alcohol use: Yes    Alcohol/week: 8.0 standard drinks    Types: 7 Glasses of wine, 1 Standard drinks or equivalent per week  . Drug use: Never  . Sexual activity: Yes    Birth control/protection: Surgical  Other Topics Concern  . Not on file  Social History Narrative   Has homes in [place] and [place], [place] (winters there).   Social Determinants of Health   Financial Resource Strain: Low Risk   . Difficulty of Paying Living Expenses: Not hard at all  Food Insecurity: Not on file  Transportation Needs: Not on file  Physical Activity: Not on file  Stress: Not on file  Social Connections: Not on file    Tobacco Counseling Counseling given: Not Answered   Clinical Intake:                 Diabetic?no         Activities of Daily Living No flowsheet data found.  Patient Care Team: [name], MD as PCP - General Nahser, Wonda Cheng, MD as  PCP - Cardiology (Cardiology) Viona Gilmore, [facility] as Pharmacist (Pharmacist)  Indicate any recent Medical Services you may have received from other than [facility] providers in the past year (date may be approximate).     Assessment:   This is a routine wellness examination for Natasha.  Hearing/Vision screen No exam data present  Dietary issues and exercise activities discussed:    Goals Addressed   None    Depression Screen PHQ  2/9 Scores 04/11/2020 02/19/2018 10/20/2016 09/05/2014  PHQ - 2 Score 0 0 0 0    Fall Risk Fall Risk  04/11/2020 02/19/2018 10/20/2016 09/05/2014  Falls in the past year? 0 No No No  Number falls in past yr: 0 - - -  Injury with Fall? 0 - - -    FALL RISK PREVENTION PERTAINING TO THE HOME:  Any stairs in or around the home? Yes  If so, are there any without handrails? No  Home free of loose throw rugs in walkways, pet beds, electrical cords, etc? Yes  Adequate lighting in your home to reduce risk of falls? Yes   ASSISTIVE DEVICES UTILIZED TO PREVENT FALLS:  Life alert? No  Use of a cane, walker or w/c? {YES/NO:21197} Grab bars in the bathroom? {YES/NO:21197} Shower chair or bench in shower? {YES/NO:21197} Elevated toilet seat or a handicapped toilet? {YES/NO:21197}  TIMED UP AND GO:  Was the test performed? {YES/NO:21197}.  Length of time to ambulate 10 feet: *** sec.   {Appearance of RKYH:0623762}  Cognitive Function:        Immunizations Immunization History  Administered Date(s) Administered  . Influenza Inj Mdck Quad Pf 03/02/2017, 04/19/2018  . Influenza Split 03/04/2011, 03/12/2011  . Influenza Whole 02/11/2010  . Influenza,inj,Quad PF,6+ Mos 05/04/2015, 03/02/2017, 01/28/2019, 04/11/2020  . Influenza,inj,quad, With Preservative 03/05/2016  . Influenza-Unspecified 01/08/2016  . Moderna SARS-COV2 Booster Vaccination 03/21/2020  . Moderna Sars-Covid-2 Vaccination 07/11/2019, 08/03/2019  . Tdap 02/19/2018    {TDAP  status:2101805}  {Flu Vaccine status:2101806}  {Pneumococcal vaccine status:2101807}  {Covid-19 vaccine status:2101808}  Qualifies for Shingles Vaccine? {YES/NO:21197}  Zostavax completed {YES/NO:21197}  {Shingrix Completed?:2101804}  Screening Tests Health Maintenance  Topic Date Due  . Hepatitis C Screening  Never done  . PNA vac Low Risk Adult (1 of 2 - PCV13) Never done  . COVID-19 Vaccine (4 - Booster) 06/21/2020  . INFLUENZA VACCINE  12/03/2020  . COLONOSCOPY (Pts 45-52yrs Insurance coverage will need to be confirmed)  01/26/2024  . TETANUS/TDAP  02/20/2028  . DEXA SCAN  Completed  . HPV VACCINES  Aged Out    Health Maintenance  Health Maintenance Due  Topic Date Due  . Hepatitis C Screening  Never done  . PNA vac Low Risk Adult (1 of 2 - PCV13) Never done  . COVID-19 Vaccine (4 - Booster) 06/21/2020    {Colorectal cancer screening:2101809}  {Mammogram status:21018020}  {Bone Density status:21018021}  Lung Cancer Screening: (Low Dose CT Chest recommended if Age 34-80 years, 30 pack-year currently smoking OR have quit w/in 15years.) {DOES NOT does:27190::"does not"} qualify.   Lung Cancer Screening Referral: ***  Additional Screening:  Hepatitis C Screening: {DOES NOT does:27190::"does not"} qualify; Completed ***  Vision Screening: Recommended annual ophthalmology exams for early detection of glaucoma and other disorders of the eye. Is the patient up to date with their annual eye exam?  {YES/NO:21197} Who is the provider or what is the name of the office in which the patient attends annual eye exams? *** If pt is not established with a provider, would they like to be referred to a provider to establish care? {YES/NO:21197}.   Dental Screening: Recommended annual dental exams for proper oral hygiene  Community Resource Referral / Chronic Care Management: CRR required this visit?  {YES/NO:21197}  CCM required this visit?  {YES/NO:21197}     Plan:      I have personally reviewed and noted the following in the patient's chart:   . Medical and social history .  Use of alcohol, tobacco or illicit drugs  . Current medications and supplements including opioid prescriptions. {Opioid Prescriptions:[phone]} . Functional ability and status . Nutritional status . Physical activity . Advanced directives . List of other physicians . Hospitalizations, surgeries, and ER visits in previous 12 months . Vitals . Screenings to include cognitive, depression, and falls . Referrals and appointments  In addition, I have reviewed and discussed with patient certain preventive protocols, quality metrics, and best practice recommendations. A written personalized care plan for preventive services as well as general preventive health recommendations were provided to patient.     Randel Pigg, LPN   [id number]/76/[id number]   Nurse Notes: ***

## 2020-10-10 ENCOUNTER — Other Ambulatory Visit: Payer: Self-pay | Admitting: Cardiovascular Disease

## 2020-10-30 DIAGNOSIS — M79671 Pain in right foot: Secondary | ICD-10-CM | POA: Diagnosis not present

## 2020-11-14 DIAGNOSIS — G6289 Other specified polyneuropathies: Secondary | ICD-10-CM | POA: Diagnosis not present

## 2020-11-14 DIAGNOSIS — M5417 Radiculopathy, lumbosacral region: Secondary | ICD-10-CM | POA: Diagnosis not present

## 2020-11-19 DIAGNOSIS — M79671 Pain in right foot: Secondary | ICD-10-CM | POA: Diagnosis not present

## 2020-11-19 DIAGNOSIS — M5416 Radiculopathy, lumbar region: Secondary | ICD-10-CM | POA: Diagnosis not present

## 2020-12-03 ENCOUNTER — Telehealth: Payer: Self-pay | Admitting: Family Medicine

## 2020-12-03 NOTE — Telephone Encounter (Signed)
Left message for patient to call back and schedule Medicare Annual Wellness Visit (AWV) either virtually or in office.   AWV-I per PALMETTO 08/04/11-  please schedule at anytime with [facility] Nurse Health Advisor 1 or 2  Per laura pt no showed for 09/17/20 appt for AWV  This should be a 45 minute visit.

## 2020-12-19 ENCOUNTER — Ambulatory Visit: Payer: PPO

## 2020-12-19 ENCOUNTER — Other Ambulatory Visit: Payer: Self-pay | Admitting: Cardiovascular Disease

## 2020-12-19 DIAGNOSIS — Z1152 Encounter for screening for COVID-19: Secondary | ICD-10-CM | POA: Diagnosis not present

## 2020-12-19 DIAGNOSIS — J00 Acute nasopharyngitis [common cold]: Secondary | ICD-10-CM | POA: Diagnosis not present

## 2020-12-19 DIAGNOSIS — Z03818 Encounter for observation for suspected exposure to other biological agents ruled out: Secondary | ICD-10-CM | POA: Diagnosis not present

## 2020-12-19 NOTE — Progress Notes (Deleted)
Subjective:   Tina May is a 75 y.o. female who presents for Medicare Annual (Subsequent) preventive examination.    Review of Systems    N/A       Objective:    There were no vitals filed for this visit. There is no height or weight on file to calculate BMI.  Advanced Directives 01/13/2019 11/26/2018 10/27/2018 01/27/2017 10/26/2016 10/26/2016 09/08/2014  Does Patient Have a Medical Advance Directive? Yes Yes Yes Yes Yes No No  Type of Advance Directive Living will [place];Living will [place];Living will [place];Living will Living will - -  Does patient want to make changes to medical advance directive? No - Patient declined No - Patient declined - No - Patient declined No - Patient declined - -  Copy of [place] in Chart? - - - No - copy requested - - -  Would patient like information on creating a medical advance directive? - - - - - - No - patient declined information    Current Medications (verified) Outpatient Encounter Medications as of 12/19/2020  Medication Sig   albuterol (PROAIR HFA) 108 (90 Base) MCG/ACT inhaler INHALE 2 PUFFS INTO THE LUNGS EVERY 4 (FOUR) HOURS AS NEEDED FOR WHEEZING OR SHORTNESS OF BREATH.   bisoprolol (ZEBETA) 5 MG tablet TAKE 1 TABLET BY MOUTH EVERY DAY   ezetimibe (ZETIA) 10 MG tablet Take 1 tablet (10 mg total) by mouth daily.   FIBER SELECT GUMMIES CHEW Chew 2 tablets by mouth daily. Total daily dose '10mg'$ .   Hyaluronic Acid-Vitamin C (HYALURONIC ACID PO) Take 1 tablet by mouth daily. Women's Hyaluronic Acid   meloxicam (MOBIC) 15 MG tablet Take 1 tablet (15 mg total) by mouth daily.   Multiple Vitamin (MULTIVITAMIN WITH MINERALS) TABS tablet Take 1 tablet by mouth daily. One-A-Day   PREMARIN 0.625 MG tablet Take 0.625 mg by mouth once a week.   RESTASIS 0.05 % ophthalmic emulsion Place 1 drop into both eyes 2 (two) times daily.    rosuvastatin (CRESTOR) 5 MG tablet  TAKE 1 TABLET BY MOUTH EVERY DAY   sacubitril-valsartan (ENTRESTO) 97-103 MG TAKE 1 TABLET BY MOUTH 2 (TWO) TIMES DAILY. PLEASE MAKE YEARLY APPT WITH [name] FOR SEPTEMBER 22   spironolactone (ALDACTONE) 25 MG tablet Take 0.5 tablets (12.5 mg total) by mouth daily.   TURMERIC CURCUMIN PO Take 1,000 mg by mouth daily.   No facility-administered encounter medications on file as of 12/19/2020.    Allergies (verified) Amlodipine, Atorvastatin, Diclofenac, and Morphine and related   History: Past Medical History:  Diagnosis Date   Arthritis    Complex cyst of right ovary    DDD (degenerative disc disease), lumbar    Diverticulosis    External hemorrhoids    History of basal cell carcinoma (BCC) excision    HTN (hypertension)    Hyperlipidemia    Intermittent palpitations    event monitor 10-27-2018 (Dr Acie Fredrickson)  SR including ST and SB with two episodes NSVT   Mild asthma    NICM (nonischemic cardiomyopathy) ([place])    Dr Acie Fredrickson, dx 06/ 2018  ef 25-30%;   stress echo 12-22-2016 no ischemia, ef 30-35%; last echo 04-22-2018 ef 45-50%; cath 01-27-2017  no sig. cad,10% pRCA to mRCA   Pancreatic lesion 03/11/2013   last CT abd. 10-27-2018, stable   Spinal stenosis of lumbar region    Systolic CHF, chronic ([place]) 10/2016   followed by Dr Acie Fredrickson;  per  echo 04-22-2018 ef 45-50%, G1DD   Past Surgical History:  Procedure Laterality Date   COLONOSCOPY  01/25/2014   per Dr. [name], diverticulae only, repeat in 10 yrs    HAND TENDON SURGERY Right 2014  approx.   HEMICOLECTOMY  2005   resection colon tumor, per pt benign   LEFT HEART CATH AND CORONARY ANGIOGRAPHY N/A 01/27/2017   Procedure: LEFT HEART CATH AND CORONARY ANGIOGRAPHY;  Surgeon: Martinique, Peter M, MD;  Location: [facility];  Service: Cardiovascular;  Laterality: N/A;   ROBOTIC ASSISTED SALPINGO OOPHERECTOMY Bilateral 01/13/2019   Procedure: XI ROBOTIC ASSISTED SALPINGO OOPHORECTOMY;  Surgeon: [name], MD;  Location:  [facility];  Service: Gynecology;  Laterality: Bilateral;   SPINE SURGERY  04/02/2020   lumbar spine surgery at [place]  age 83   VAGINAL HYSTERECTOMY  1994   Family History  Problem Relation Age of Onset   Hyperlipidemia Other    Hypertension Other    Prostate cancer Other    Healthy Sister    Healthy Brother    Colon cancer Neg Hx    Esophageal cancer Neg Hx    Inflammatory bowel disease Neg Hx    Liver disease Neg Hx    Pancreatic cancer Neg Hx    Rectal cancer Neg Hx    Stomach cancer Neg Hx    Social History   Socioeconomic History   Marital status: Widowed    Spouse name: Not on file   Number of children: 3   Years of education: Not on file   Highest education level: Not on file  Occupational History   Occupation: Retired  Tobacco Use   Smoking status: Former    Packs/day: 1.00    Years: 20.00    Pack years: 20.00    Types: Cigarettes    Quit date: 05/06/1991    Years since quitting: 29.6   Smokeless tobacco: Never  Vaping Use   Vaping Use: Never used  Substance and Sexual Activity   Alcohol use: Yes    Alcohol/week: 8.0 standard drinks    Types: 7 Glasses of wine, 1 Standard drinks or equivalent per week   Drug use: Never   Sexual activity: Yes    Birth control/protection: Surgical  Other Topics Concern   Not on file  Social History Narrative   Has homes in [place] and [place], [place] (winters there).   Social Determinants of Health   Financial Resource Strain: Low Risk    Difficulty of Paying Living Expenses: Not hard at all  Food Insecurity: Not on file  Transportation Needs: Not on file  Physical Activity: Not on file  Stress: Not on file  Social Connections: Not on file    Tobacco Counseling Counseling given: Not Answered   Clinical Intake:                 Diabetic?***         Activities of Daily Living No flowsheet data found.  Patient Care Team: [name], MD as PCP -  General Nahser, Wonda Cheng, MD as PCP - Cardiology (Cardiology) Viona Gilmore, [facility] as Pharmacist (Pharmacist)  Indicate any recent Medical Services you may have received from other than [facility] providers in the past year (date may be approximate).     Assessment:   This is a routine wellness examination for Tina May.  Hearing/Vision screen No results found.  Dietary issues and exercise activities discussed:  Goals Addressed   None    Depression Screen PHQ 2/9 Scores 04/11/2020 02/19/2018 10/20/2016 09/05/2014  PHQ - 2 Score 0 0 0 0    Fall Risk Fall Risk  04/11/2020 02/19/2018 10/20/2016 09/05/2014  Falls in the past year? 0 No No No  Number falls in past yr: 0 - - -  Injury with Fall? 0 - - -    FALL RISK PREVENTION PERTAINING TO THE HOME:  Any stairs in or around the home? {YES/NO:21197} If so, are there any without handrails? {YES/NO:21197} Home free of loose throw rugs in walkways, pet beds, electrical cords, etc? {YES/NO:21197} Adequate lighting in your home to reduce risk of falls? {YES/NO:21197}  ASSISTIVE DEVICES UTILIZED TO PREVENT FALLS:  Life alert? {YES/NO:21197} Use of a cane, walker or w/c? {YES/NO:21197} Grab bars in the bathroom? {YES/NO:21197} Shower chair or bench in shower? {YES/NO:21197} Elevated toilet seat or a handicapped toilet? {YES/NO:21197}  TIMED UP AND GO:  Was the test performed? {YES/NO:21197}.  Length of time to ambulate 10 feet: *** sec.   {Appearance of GA:4730917  Cognitive Function:        Immunizations Immunization History  Administered Date(s) Administered   Influenza Inj Mdck Quad Pf 03/02/2017, 04/19/2018   Influenza Split 03/04/2011, 03/12/2011   Influenza Whole 02/11/2010   Influenza,inj,Quad PF,6+ Mos 05/04/2015, 03/02/2017, 01/28/2019, 04/11/2020   Influenza,inj,quad, With Preservative 03/05/2016   Influenza-Unspecified 01/08/2016   Moderna SARS-COV2 Booster Vaccination 03/21/2020   Moderna Sars-Covid-2  Vaccination 07/11/2019, 08/03/2019   Tdap 02/19/2018    {TDAP status:2101805}  {Flu Vaccine status:2101806}  {Pneumococcal vaccine status:2101807}  {Covid-19 vaccine status:2101808}  Qualifies for Shingles Vaccine? {YES/NO:21197}  Zostavax completed {YES/NO:21197}  {Shingrix Completed?:2101804}  Screening Tests Health Maintenance  Topic Date Due   Hepatitis C Screening  Never done   Zoster Vaccines- Shingrix (1 of 2) Never done   PNA vac Low Risk Adult (1 of 2 - PCV13) Never done   COVID-19 Vaccine (4 - Booster) 06/21/2020   INFLUENZA VACCINE  12/03/2020   COLONOSCOPY (Pts 45-24yr Insurance coverage will need to be confirmed)  01/26/2024   TETANUS/TDAP  02/20/2028   DEXA SCAN  Completed   HPV VACCINES  Aged Out    Health Maintenance  Health Maintenance Due  Topic Date Due   Hepatitis C Screening  Never done   Zoster Vaccines- Shingrix (1 of 2) Never done   PNA vac Low Risk Adult (1 of 2 - PCV13) Never done   COVID-19 Vaccine (4 - Booster) 06/21/2020   INFLUENZA VACCINE  12/03/2020    {Colorectal cancer screening:2101809}  {Mammogram status:21018020}  {Bone Density status:21018021}  Lung Cancer Screening: (Low Dose CT Chest recommended if Age 75-80 years, 30 pack-year currently smoking OR have quit w/in 15years.) {DOES NOT does:27190::"does not"} qualify.   Lung Cancer Screening Referral: ***  Additional Screening:  Hepatitis C Screening: {DOES NOT does:27190::"does not"} qualify; Completed ***  Vision Screening: Recommended annual ophthalmology exams for early detection of glaucoma and other disorders of the eye. Is the patient up to date with their annual eye exam?  {YES/NO:21197} Who is the provider or what is the name of the office in which the patient attends annual eye exams? *** If pt is not established with a provider, would they like to be referred to a provider to establish care? {YES/NO:21197}.   Dental Screening: Recommended annual dental exams  for proper oral hygiene  Community Resource Referral / Chronic Care Management: CRR required this visit?  {YES/NO:21197}  CCM required this  visit?  {YES/NO:21197}     Plan:     I have personally reviewed and noted the following in the patient's chart:   Medical and social history Use of alcohol, tobacco or illicit drugs  Current medications and supplements including opioid prescriptions.  Functional ability and status Nutritional status Physical activity Advanced directives List of other physicians Hospitalizations, surgeries, and ER visits in previous 12 months Vitals Screenings to include cognitive, depression, and falls Referrals and appointments  In addition, I have reviewed and discussed with patient certain preventive protocols, quality metrics, and best practice recommendations. A written personalized care plan for preventive services as well as general preventive health recommendations were provided to patient.     Randel Pigg, LPN   [id number]   Nurse Notes: ***

## 2020-12-26 DIAGNOSIS — H10023 Other mucopurulent conjunctivitis, bilateral: Secondary | ICD-10-CM | POA: Diagnosis not present

## 2020-12-31 ENCOUNTER — Telehealth: Payer: Self-pay | Admitting: Family Medicine

## 2020-12-31 NOTE — Telephone Encounter (Signed)
Left message for patient to call back and schedule Medicare Annual Wellness Visit (AWV) either virtually or in office. Left  my Herbie Drape number [phone]  AWV-I per PALMETTO 08/04/11 please schedule at anytime with [facility] Nurse Health Advisor 1 or 2   This should be a 45 minute visit.

## 2021-01-21 ENCOUNTER — Telehealth: Payer: Self-pay | Admitting: Pharmacist

## 2021-01-21 ENCOUNTER — Ambulatory Visit (INDEPENDENT_AMBULATORY_CARE_PROVIDER_SITE_OTHER): Payer: PPO

## 2021-01-21 ENCOUNTER — Other Ambulatory Visit: Payer: Self-pay

## 2021-01-21 VITALS — BP 132/73 | HR 63 | Temp 97.9°F | Ht 62.0 in | Wt 175.0 lb

## 2021-01-21 DIAGNOSIS — Z0000 Encounter for general adult medical examination without abnormal findings: Secondary | ICD-10-CM | POA: Diagnosis not present

## 2021-01-21 NOTE — Patient Instructions (Signed)
Tina May , Thank you for taking time to come for your Medicare Wellness Visit. I appreciate your ongoing commitment to your health goals. Please review the following plan we discussed and let me know if I can assist you in the future.   Screening recommendations/referrals: Colonoscopy: 01/28/2014 Mammogram: 02/2020 Bone Density: 12/04/2010 Recommended yearly ophthalmology/optometry visit for glaucoma screening and checkup Recommended yearly dental visit for hygiene and checkup  Vaccinations: Influenza vaccine: due in fall 2022  Pneumococcal vaccine: completed series  Tdap vaccine: 02/19/2018 Shingles vaccine: will obtain local pharmacy    Advanced directives: will provide copies   Conditions/risks identified: none   Next appointment: 01/25/2021  0945  Dr.Fry    Preventive Care 75 Years and Older, Female Preventive care refers to lifestyle choices and visits with your health care provider that can promote health and wellness. What does preventive care include? A yearly physical exam. This is also called an annual well check. Dental exams once or twice a year. Routine eye exams. Ask your health care provider how often you should have your eyes checked. Personal lifestyle choices, including: Daily care of your teeth and gums. Regular physical activity. Eating a healthy diet. Avoiding tobacco and drug use. Limiting alcohol use. Practicing safe sex. Taking low-dose aspirin every day. Taking vitamin and mineral supplements as recommended by your health care provider. What happens during an annual well check? The services and screenings done by your health care provider during your annual well check will depend on your age, overall health, lifestyle risk factors, and family history of disease. Counseling  Your health care provider may ask you questions about your: Alcohol use. Tobacco use. Drug use. Emotional well-being. Home and relationship well-being. Sexual  activity. Eating habits. History of falls. Memory and ability to understand (cognition). Work and work Statistician. Reproductive health. Screening  You may have the following tests or measurements: Height, weight, and BMI. Blood pressure. Lipid and cholesterol levels. These may be checked every 5 years, or more frequently if you are over 89 years old. Skin check. Lung cancer screening. You may have this screening every year starting at age 59 if you have a 30-pack-year history of smoking and currently smoke or have quit within the past 15 years. Fecal occult blood test (FOBT) of the stool. You may have this test every year starting at age 30. Flexible sigmoidoscopy or colonoscopy. You may have a sigmoidoscopy every 5 years or a colonoscopy every 10 years starting at age 34. Hepatitis C blood test. Hepatitis B blood test. Sexually transmitted disease (STD) testing. Diabetes screening. This is done by checking your blood sugar (glucose) after you have not eaten for a while (fasting). You may have this done every 1-3 years. Bone density scan. This is done to screen for osteoporosis. You may have this done starting at age 65. Mammogram. This may be done every 1-2 years. Talk to your health care provider about how often you should have regular mammograms. Talk with your health care provider about your test results, treatment options, and if necessary, the need for more tests. Vaccines  Your health care provider may recommend certain vaccines, such as: Influenza vaccine. This is recommended every year. Tetanus, diphtheria, and acellular pertussis (Tdap, Td) vaccine. You may need a Td booster every 10 years. Zoster vaccine. You may need this after age 38. Pneumococcal 13-valent conjugate (PCV13) vaccine. One dose is recommended after age 34. Pneumococcal polysaccharide (PPSV23) vaccine. One dose is recommended after age 70. Talk to your health care  provider about which screenings and vaccines  you need and how often you need them. This information is not intended to replace advice given to you by your health care provider. Make sure you discuss any questions you have with your health care provider. Document Released: 05/18/2015 Document Revised: 01/09/2016 Document Reviewed: 02/20/2015 Elsevier Interactive Patient Education  2017 [place] Prevention in the Home Falls can cause injuries. They can happen to people of all ages. There are many things you can do to make your home safe and to help prevent falls. What can I do on the outside of my home? Regularly fix the edges of walkways and driveways and fix any cracks. Remove anything that might make you trip as you walk through a door, such as a raised step or threshold. Trim any bushes or trees on the path to your home. Use bright outdoor lighting. Clear any walking paths of anything that might make someone trip, such as rocks or tools. Regularly check to see if handrails are loose or broken. Make sure that both sides of any steps have handrails. Any raised decks and porches should have guardrails on the edges. Have any leaves, snow, or ice cleared regularly. Use sand or salt on walking paths during winter. Clean up any spills in your garage right away. This includes oil or grease spills. What can I do in the bathroom? Use night lights. Install grab bars by the toilet and in the tub and shower. Do not use towel bars as grab bars. Use non-skid mats or decals in the tub or shower. If you need to sit down in the shower, use a plastic, non-slip stool. Keep the floor dry. Clean up any water that spills on the floor as soon as it happens. Remove soap buildup in the tub or shower regularly. Attach bath mats securely with double-sided non-slip rug tape. Do not have throw rugs and other things on the floor that can make you trip. What can I do in the bedroom? Use night lights. Make sure that you have a light by your bed that  is easy to reach. Do not use any sheets or blankets that are too big for your bed. They should not hang down onto the floor. Have a firm chair that has side arms. You can use this for support while you get dressed. Do not have throw rugs and other things on the floor that can make you trip. What can I do in the kitchen? Clean up any spills right away. Avoid walking on wet floors. Keep items that you use a lot in easy-to-reach places. If you need to reach something above you, use a strong step stool that has a grab bar. Keep electrical cords out of the way. Do not use floor polish or wax that makes floors slippery. If you must use wax, use non-skid floor wax. Do not have throw rugs and other things on the floor that can make you trip. What can I do with my stairs? Do not leave any items on the stairs. Make sure that there are handrails on both sides of the stairs and use them. Fix handrails that are broken or loose. Make sure that handrails are as long as the stairways. Check any carpeting to make sure that it is firmly attached to the stairs. Fix any carpet that is loose or worn. Avoid having throw rugs at the top or bottom of the stairs. If you do have throw rugs, attach them to the  floor with carpet tape. Make sure that you have a light switch at the top of the stairs and the bottom of the stairs. If you do not have them, ask someone to add them for you. What else can I do to help prevent falls? Wear shoes that: Do not have high heels. Have rubber bottoms. Are comfortable and fit you well. Are closed at the toe. Do not wear sandals. If you use a stepladder: Make sure that it is fully opened. Do not climb a closed stepladder. Make sure that both sides of the stepladder are locked into place. Ask someone to hold it for you, if possible. Clearly mark and make sure that you can see: Any grab bars or handrails. First and last steps. Where the edge of each step is. Use tools that help you  move around (mobility aids) if they are needed. These include: Canes. Walkers. Scooters. Crutches. Turn on the lights when you go into a dark area. Replace any light bulbs as soon as they burn out. Set up your furniture so you have a clear path. Avoid moving your furniture around. If any of your floors are uneven, fix them. If there are any pets around you, be aware of where they are. Review your medicines with your doctor. Some medicines can make you feel dizzy. This can increase your chance of falling. Ask your doctor what other things that you can do to help prevent falls. This information is not intended to replace advice given to you by your health care provider. Make sure you discuss any questions you have with your health care provider. Document Released: 02/15/2009 Document Revised: 09/27/2015 Document Reviewed: 05/26/2014 Elsevier Interactive Patient Education  2017 [employer].

## 2021-01-21 NOTE — Progress Notes (Signed)
Subjective:   Tina May is a 75 y.o. female who presents for Medicare Annual (Subsequent) preventive examination.  Review of Systems    N/a       Objective:    There were no vitals filed for this visit. There is no height or weight on file to calculate BMI.  Advanced Directives 01/13/2019 11/26/2018 10/27/2018 01/27/2017 10/26/2016 10/26/2016 09/08/2014  Does Patient Have a Medical Advance Directive? Yes Yes Yes Yes Yes No No  Type of Advance Directive Living will [place];Living will [place];Living will [place];Living will Living will - -  Does patient want to make changes to medical advance directive? No - Patient declined No - Patient declined - No - Patient declined No - Patient declined - -  Copy of [place] in Chart? - - - No - copy requested - - -  Would patient like information on creating a medical advance directive? - - - - - - No - patient declined information    Current Medications (verified) Outpatient Encounter Medications as of 01/21/2021  Medication Sig   albuterol (PROAIR HFA) 108 (90 Base) MCG/ACT inhaler INHALE 2 PUFFS INTO THE LUNGS EVERY 4 (FOUR) HOURS AS NEEDED FOR WHEEZING OR SHORTNESS OF BREATH.   bisoprolol (ZEBETA) 5 MG tablet TAKE 1 TABLET BY MOUTH EVERY DAY   ezetimibe (ZETIA) 10 MG tablet Take 1 tablet (10 mg total) by mouth daily.   FIBER SELECT GUMMIES CHEW Chew 2 tablets by mouth daily. Total daily dose '10mg'$ .   Hyaluronic Acid-Vitamin C (HYALURONIC ACID PO) Take 1 tablet by mouth daily. Women's Hyaluronic Acid   meloxicam (MOBIC) 15 MG tablet Take 1 tablet (15 mg total) by mouth daily.   Multiple Vitamin (MULTIVITAMIN WITH MINERALS) TABS tablet Take 1 tablet by mouth daily. One-A-Day   PREMARIN 0.625 MG tablet Take 0.625 mg by mouth once a week.   RESTASIS 0.05 % ophthalmic emulsion Place 1 drop into both eyes 2 (two) times daily.    rosuvastatin (CRESTOR) 5 MG tablet  TAKE 1 TABLET BY MOUTH EVERY DAY   sacubitril-valsartan (ENTRESTO) 97-103 MG TAKE 1 TABLET BY MOUTH 2 (TWO) TIMES DAILY. PLEASE MAKE YEARLY APPT WITH [name] FOR SEPTEMBER 22   spironolactone (ALDACTONE) 25 MG tablet Take 0.5 tablets (12.5 mg total) by mouth daily.   TURMERIC CURCUMIN PO Take 1,000 mg by mouth daily.   No facility-administered encounter medications on file as of 01/21/2021.    Allergies (verified) Amlodipine, Atorvastatin, Diclofenac, and Morphine and related   History: Past Medical History:  Diagnosis Date   Arthritis    Complex cyst of right ovary    DDD (degenerative disc disease), lumbar    Diverticulosis    External hemorrhoids    History of basal cell carcinoma (BCC) excision    HTN (hypertension)    Hyperlipidemia    Intermittent palpitations    event monitor 10-27-2018 (Dr Acie Fredrickson)  SR including ST and SB with two episodes NSVT   Mild asthma    NICM (nonischemic cardiomyopathy) ([place])    Dr Acie Fredrickson, dx 06/ 2018  ef 25-30%;   stress echo 12-22-2016 no ischemia, ef 30-35%; last echo 04-22-2018 ef 45-50%; cath 01-27-2017  no sig. cad,10% pRCA to mRCA   Pancreatic lesion 03/11/2013   last CT abd. 10-27-2018, stable   Spinal stenosis of lumbar region    Systolic CHF, chronic ([place]) 10/2016   followed by Dr Acie Fredrickson;  per echo 04-22-2018  ef 45-50%, G1DD   Past Surgical History:  Procedure Laterality Date   COLONOSCOPY  01/25/2014   per Dr. [name], diverticulae only, repeat in 10 yrs    HAND TENDON SURGERY Right 2014  approx.   HEMICOLECTOMY  2005   resection colon tumor, per pt benign   LEFT HEART CATH AND CORONARY ANGIOGRAPHY N/A 01/27/2017   Procedure: LEFT HEART CATH AND CORONARY ANGIOGRAPHY;  Surgeon: Martinique, Peter M, MD;  Location: [facility];  Service: Cardiovascular;  Laterality: N/A;   ROBOTIC ASSISTED SALPINGO OOPHERECTOMY Bilateral 01/13/2019   Procedure: XI ROBOTIC ASSISTED SALPINGO OOPHORECTOMY;  Surgeon: [name], MD;  Location:  [facility];  Service: Gynecology;  Laterality: Bilateral;   SPINE SURGERY  04/02/2020   lumbar spine surgery at [place]  age 85   VAGINAL HYSTERECTOMY  1994   Family History  Problem Relation Age of Onset   Hyperlipidemia Other    Hypertension Other    Prostate cancer Other    Healthy Sister    Healthy Brother    Colon cancer Neg Hx    Esophageal cancer Neg Hx    Inflammatory bowel disease Neg Hx    Liver disease Neg Hx    Pancreatic cancer Neg Hx    Rectal cancer Neg Hx    Stomach cancer Neg Hx    Social History   Socioeconomic History   Marital status: Widowed    Spouse name: Not on file   Number of children: 3   Years of education: Not on file   Highest education level: Not on file  Occupational History   Occupation: Retired  Tobacco Use   Smoking status: Former    Packs/day: 1.00    Years: 20.00    Pack years: 20.00    Types: Cigarettes    Quit date: 05/06/1991    Years since quitting: 29.7   Smokeless tobacco: Never  Vaping Use   Vaping Use: Never used  Substance and Sexual Activity   Alcohol use: Yes    Alcohol/week: 8.0 standard drinks    Types: 7 Glasses of wine, 1 Standard drinks or equivalent per week   Drug use: Never   Sexual activity: Yes    Birth control/protection: Surgical  Other Topics Concern   Not on file  Social History Narrative   Has homes in [place] and [place], [place] (winters there).   Social Determinants of Health   Financial Resource Strain: Low Risk    Difficulty of Paying Living Expenses: Not hard at all  Food Insecurity: Not on file  Transportation Needs: Not on file  Physical Activity: Not on file  Stress: Not on file  Social Connections: Not on file    Tobacco Counseling Counseling given: Not Answered   Clinical Intake:                 Diabetic?no         Activities of Daily Living No flowsheet data found.  Patient Care Team: [name], MD as PCP -  General Nahser, Wonda Cheng, MD as PCP - Cardiology (Cardiology) Viona Gilmore, [facility] as Pharmacist (Pharmacist)  Indicate any recent Medical Services you may have received from other than [facility] providers in the past year (date may be approximate).     Assessment:   This is a routine wellness examination for [place].  Hearing/Vision screen No results found.  Dietary issues and exercise activities discussed:     Goals Addressed  None    Depression Screen PHQ 2/9 Scores 04/11/2020 02/19/2018 10/20/2016 09/05/2014  PHQ - 2 Score 0 0 0 0    Fall Risk Fall Risk  04/11/2020 02/19/2018 10/20/2016 09/05/2014  Falls in the past year? 0 No No No  Number falls in past yr: 0 - - -  Injury with Fall? 0 - - -    FALL RISK PREVENTION PERTAINING TO THE HOME:  Any stairs in or around the home? Yes  If so, are there any without handrails? No  Home free of loose throw rugs in walkways, pet beds, electrical cords, etc? Yes  Adequate lighting in your home to reduce risk of falls? Yes   ASSISTIVE DEVICES UTILIZED TO PREVENT FALLS:  Life alert? Yes  Use of a cane, walker or w/c? No  Grab bars in the bathroom? No  Shower chair or bench in shower? No  Elevated toilet seat or a handicapped toilet? No   TIMED UP AND GO:  Was the test performed? Yes .  Length of time to ambulate 10 feet: 10 sec.   Gait steady and fast with assistive device  Cognitive Function:    Normal cognitive status assessed by direct observation by this Nurse Health Advisor. No abnormalities found.      Immunizations Immunization History  Administered Date(s) Administered   Influenza Inj Mdck Quad Pf 03/02/2017, 04/19/2018   Influenza Split 03/04/2011, 03/12/2011   Influenza Whole 02/11/2010   Influenza,inj,Quad PF,6+ Mos 05/04/2015, 03/02/2017, 01/28/2019, 04/11/2020   Influenza,inj,quad, With Preservative 03/05/2016   Influenza-Unspecified 01/08/2016   Moderna SARS-COV2 Booster Vaccination 03/21/2020   Moderna  Sars-Covid-2 Vaccination 07/11/2019, 08/03/2019   Tdap 02/19/2018    TDAP status: Up to date  Flu Vaccine status: Up to date  Pneumococcal vaccine status: Up to date  Covid-19 vaccine status: Completed vaccines  Qualifies for Shingles Vaccine? Yes   Zostavax completed No   Shingrix Completed?: No.    Education has been provided regarding the importance of this vaccine. Patient has been advised to call insurance company to determine out of pocket expense if they have not yet received this vaccine. Advised may also receive vaccine at local pharmacy or Health Dept. Verbalized acceptance and understanding.  Screening Tests Health Maintenance  Topic Date Due   Hepatitis C Screening  Never done   Zoster Vaccines- Shingrix (1 of 2) Never done   COVID-19 Vaccine (4 - Booster) 06/13/2020   INFLUENZA VACCINE  12/03/2020   COLONOSCOPY (Pts 45-63yr Insurance coverage will need to be confirmed)  01/26/2024   TETANUS/TDAP  02/20/2028   DEXA SCAN  Completed   HPV VACCINES  Aged Out    Health Maintenance  Health Maintenance Due  Topic Date Due   Hepatitis C Screening  Never done   Zoster Vaccines- Shingrix (1 of 2) Never done   COVID-19 Vaccine (4 - Booster) 06/13/2020   INFLUENZA VACCINE  12/03/2020    Colorectal cancer screening: Type of screening: Colonoscopy. Completed 01/25/2014. Repeat every 10 years  Mammogram status: Completed 02/2020. Repeat every year  Bone Density status: Completed 07/14/2018. Results reflect: Bone density results: OSTEOPENIA. Repeat every 5 years.  Lung Cancer Screening: (Low Dose CT Chest recommended if Age 75-80years, 30 pack-year currently smoking OR have quit w/in 15years.) does not qualify.  N/A Lung Cancer Screening Referral: n/a  Additional Screening:  Hepatitis C Screening: does qualify;   Vision Screening: Recommended annual ophthalmology exams for early detection of glaucoma and other disorders of the eye. Is the patient  up to date with  their annual eye exam?  Yes  Who is the provider or what is the name of the office in which the patient attends annual eye exams? Dr.Scott If pt is not established with a provider, would they like to be referred to a provider to establish care? No .   Dental Screening: Recommended annual dental exams for proper oral hygiene  Community Resource Referral / Chronic Care Management: CRR required this visit?  No   CCM required this visit?  No      Plan:     I have personally reviewed and noted the following in the patient's chart:   Medical and social history Use of alcohol, tobacco or illicit drugs  Current medications and supplements including opioid prescriptions.  Functional ability and status Nutritional status Physical activity Advanced directives List of other physicians Hospitalizations, surgeries, and ER visits in previous 12 months Vitals Screenings to include cognitive, depression, and falls Referrals and appointments  In addition, I have reviewed and discussed with patient certain preventive protocols, quality metrics, and best practice recommendations. A written personalized care plan for preventive services as well as general preventive health recommendations were provided to patient.     Randel Pigg, LPN   [phone]   Nurse Notes: none

## 2021-01-21 NOTE — Chronic Care Management (AMB) (Signed)
    Chronic Care Management Pharmacy Assistant   Name: IFEOLUWA BEISSEL  MRN: [id number] DOB: November 04, 1945  01-21-2021 APPOINTMENT REMINDER   Called [facility], No answer, left message of appointment on 01-22-2021 at 9 am via telephone visit with Jeni Salles, Pharm D. Notified to have all medications, supplements, blood pressure and/or blood sugar logs available during appointment and to return call if need to reschedule.  Care Gaps: Hepatitis C Screening - Overdue Zoster Vaccine - Overdue COVID Booster #4 - Overdue Flu Vaccine- Overdue  Star Rating Drug: Rosuvastatin (Crestor) 5 mg - Last [facility]-17-2020 [facility] Sacubitril-Valsartan Delene Loll) 97-103 mg - Last filled 12-28-2020 [facility]  Any gaps in medications fill history? None    Medications: Outpatient Encounter Medications as of 01/21/2021  Medication Sig   albuterol (PROAIR HFA) 108 (90 Base) MCG/ACT inhaler INHALE 2 PUFFS INTO THE LUNGS EVERY 4 (FOUR) HOURS AS NEEDED FOR WHEEZING OR SHORTNESS OF BREATH.   bisoprolol (ZEBETA) 5 MG tablet TAKE 1 TABLET BY MOUTH EVERY DAY   ezetimibe (ZETIA) 10 MG tablet Take 1 tablet (10 mg total) by mouth daily.   FIBER SELECT GUMMIES CHEW Chew 2 tablets by mouth daily. Total daily dose '10mg'$ .   Hyaluronic Acid-Vitamin C (HYALURONIC ACID PO) Take 1 tablet by mouth daily. Women's Hyaluronic Acid   meloxicam (MOBIC) 15 MG tablet Take 1 tablet (15 mg total) by mouth daily.   Multiple Vitamin (MULTIVITAMIN WITH MINERALS) TABS tablet Take 1 tablet by mouth daily. One-A-Day   PREMARIN 0.625 MG tablet Take 0.625 mg by mouth once a week.   RESTASIS 0.05 % ophthalmic emulsion Place 1 drop into both eyes 2 (two) times daily.    rosuvastatin (CRESTOR) 5 MG tablet TAKE 1 TABLET BY MOUTH EVERY DAY   sacubitril-valsartan (ENTRESTO) 97-103 MG TAKE 1 TABLET BY MOUTH 2 (TWO) TIMES DAILY. PLEASE MAKE YEARLY APPT WITH [name] FOR SEPTEMBER 22   spironolactone (ALDACTONE) 25 MG tablet Take 0.5  tablets (12.5 mg total) by mouth daily.   TURMERIC CURCUMIN PO Take 1,000 mg by mouth daily.   No facility-administered encounter medications on file as of 01/21/2021.  [place] Clinical Pharmacist Assistant [phone]

## 2021-01-22 ENCOUNTER — Ambulatory Visit (INDEPENDENT_AMBULATORY_CARE_PROVIDER_SITE_OTHER): Payer: PPO | Admitting: Pharmacist

## 2021-01-22 DIAGNOSIS — I10 Essential (primary) hypertension: Secondary | ICD-10-CM

## 2021-01-22 DIAGNOSIS — E785 Hyperlipidemia, unspecified: Secondary | ICD-10-CM

## 2021-01-22 NOTE — Progress Notes (Signed)
Chronic Care Management Pharmacy Note  01/22/2021 Name:  Tina May MRN:  [id number] DOB:  [birth year]  Summary: LDL not at goal < 100 Pt is tolerating Zetia without side effects Pt is still using albuterol inhaler frequently    Recommendations/Changes made from today's visit: -Recommend repeat lipid panel with annual visit -Recommend repeat spirometry testing   Plan: Follow up on lipid panel in December   Subjective: Tina May is an 75 y.o. year old female who is a primary patient of [name], MD.  The CCM team was consulted for assistance with disease management and care coordination needs.    Engaged with patient by telephone for follow up visit in response to provider referral for pharmacy case management and/or care coordination services.   Consent to Services:  The patient was given information about Chronic Care Management services, agreed to services, and gave verbal consent prior to initiation of services.  Please see initial visit note for detailed documentation.   Patient Care Team: [name], MD as PCP - General Nahser, Wonda Cheng, MD as PCP - Cardiology (Cardiology) Viona Gilmore, [facility] as Pharmacist (Pharmacist)  Recent office visits: 01/21/21 Randel Pigg, LPN: Patient presented for AWV.  04/11/20 [name], MD: Patient presented for annual exam. No medication changes made. LDL increased. Influenza vaccine administered.  Recent consult visits: 05/30/20 Patient presented for PT session.  03/31/20 [name] (OBGYN): Patient presented for mammogram.  02/15/20 [name], MD (spinal): Patient presented for initial consult for spinal surgery.  01/13/20 [name], PA-C (cardiology): Patient presented for CHF follow up. Decreased spironolactone to 1/2 tab due to increase in K.  Hospital visits: 04/02/20 Patient admitted for spine procedure.  03/09/20 Patient admitted to the ED for spinal pain and hypertension.  Objective:  Lab  Results  Component Value Date   CREATININE 0.87 04/11/2020   BUN 24 04/11/2020   GFR 69.28 10/22/2018   GFRNONAA 66 04/11/2020   GFRAA 76 04/11/2020   NA 139 04/11/2020   K 5.1 04/11/2020   CALCIUM 9.6 04/11/2020   CO2 32 04/11/2020   GLUCOSE 111 (H) 04/11/2020    Lab Results  Component Value Date/Time   HGBA1C 5.6 04/11/2020 08:56 AM   HGBA1C 5.8 (H) 10/26/2016 04:10 PM   GFR 69.28 10/22/2018 11:39 AM   GFR 72.82 09/11/2017 08:14 AM    Last diabetic Eye exam: No results found for: HMDIABEYEEXA  Last diabetic Foot exam: No results found for: HMDIABFOOTEX   Lab Results  Component Value Date   CHOL 214 (H) 04/11/2020   HDL 63 04/11/2020   LDLCALC 122 (H) 04/11/2020   LDLDIRECT 113.0 09/11/2017   TRIG 170 (H) 04/11/2020   CHOLHDL 3.4 04/11/2020    Hepatic Function Latest Ref Rng & Units 04/11/2020 01/13/2020 02/14/2019  Total Protein 6.1 - 8.1 g/dL 6.6 7.0 6.6  Albumin 3.7 - 4.7 g/dL - 4.8(H) 4.5  AST 10 - 35 U/L $Remo'16 27 21  'TibJM$ ALT 6 - 29 U/L $Remo'13 29 23  'eifAI$ Alk Phosphatase 48 - 121 IU/L - 63 61  Total Bilirubin 0.2 - 1.2 mg/dL 0.4 0.4 0.3  Bilirubin, Direct 0.0 - 0.2 mg/dL 0.1 - 0.08    Lab Results  Component Value Date/Time   TSH 1.45 04/11/2020 08:56 AM   TSH 0.804 09/20/2018 11:11 AM    CBC Latest Ref Rng & Units 04/11/2020 01/11/2019 10/27/2018  WBC 3.8 - 10.8 Thousand/uL 8.1 7.1 6.3  Hemoglobin 11.7 - 15.5 g/dL 13.1 13.4  13.0  Hematocrit 35.0 - 45.0 % 38.5 41.8 40.7  Platelets 140 - 400 Thousand/uL 362 234 222    No results found for: VD25OH  Clinical ASCVD: No  The 10-year ASCVD risk score (Arnett DK, et al., 2019) is: 22.4%   Values used to calculate the score:     Age: 83 years     Sex: Female     Is Non-Hispanic African American: No     Diabetic: No     Tobacco smoker: No     Systolic Blood Pressure: 250 mmHg     Is BP treated: Yes     HDL Cholesterol: 63 mg/dL     Total Cholesterol: 214 mg/dL    Depression screen [facility] 2/9 01/21/2021 01/21/2021 04/11/2020   Decreased Interest 0 0 0  Down, Depressed, Hopeless 0 0 0  PHQ - 2 Score 0 0 0      Social History   Tobacco Use  Smoking Status Former   Packs/day: 1.00   Years: 20.00   Pack years: 20.00   Types: Cigarettes   Quit date: 05/06/1991   Years since quitting: 29.7  Smokeless Tobacco Never   BP Readings from Last 3 Encounters:  01/21/21 132/73  04/11/20 130/68  01/13/20 120/72   Pulse Readings from Last 3 Encounters:  01/21/21 63  04/11/20 76  01/13/20 60   Wt Readings from Last 3 Encounters:  01/21/21 175 lb (79.4 kg)  04/11/20 166 lb (75.3 kg)  01/13/20 162 lb 3.2 oz (73.6 kg)   BMI Readings from Last 3 Encounters:  01/21/21 32.01 kg/m  04/11/20 30.36 kg/m  01/13/20 29.67 kg/m    Assessment/Interventions: Review of patient past medical history, allergies, medications, health status, including review of consultants reports, laboratory and other test data, was performed as part of comprehensive evaluation and provision of chronic care management services.   SDOH:  (Social Determinants of Health) assessments and interventions performed: Yes    CCM Care Plan  Allergies  Allergen Reactions   Amlodipine Diarrhea    And stomach cramps   Atorvastatin Other (See Comments)     muscle cramps  muscle cramps REACTION: muscle cramps   Diclofenac Nausea And Vomiting   Morphine And Related Nausea And Vomiting    Medications Reviewed Today     Reviewed by Randel Pigg, LPN (Licensed Practical Nurse) on 01/21/21 at 1051  Med List Status: <None>   Medication Order Taking? Sig Documenting Provider Last Dose Status Informant  albuterol (PROAIR HFA) 108 (90 Base) MCG/ACT inhaler [id number] Yes INHALE 2 PUFFS INTO THE LUNGS EVERY 4 (FOUR) HOURS AS NEEDED FOR WHEEZING OR SHORTNESS OF BREATH. [name], MD Taking Active   bisoprolol (ZEBETA) 5 MG tablet [id number] Yes TAKE 1 TABLET BY MOUTH EVERY DAY Nahser, Wonda Cheng, MD Taking Active   ezetimibe (ZETIA) 10 MG tablet  [id number] Yes Take 1 tablet (10 mg total) by mouth daily. [name], MD Taking Active   FIBER SELECT [place] [id number] Yes Chew 2 tablets by mouth daily. Total daily dose $RemoveBe'10mg'RURalVTBI$ . [provider] Taking Active Self  Hyaluronic Acid-Vitamin C (HYALURONIC ACID PO) [id number] Yes Take 1 tablet by mouth daily. Women's Hyaluronic Acid [provider] Taking Active Self  meloxicam (MOBIC) 15 MG tablet [id number] Yes Take 1 tablet (15 mg total) by mouth daily. [name], MD Taking Active   Multiple Vitamin (MULTIVITAMIN WITH MINERALS) TABS tablet [id number] Yes Take 1 tablet by mouth daily. One-A-Day [provider] Taking Active  Self  PREMARIN 0.625 MG tablet [id number] Yes Take 0.625 mg by mouth once a week. [provider] Taking Active   RESTASIS 0.05 % ophthalmic emulsion [id number] Yes Place 1 drop into both eyes 2 (two) times daily.  [provider] Taking Active Self           Med Note Nyoka Cowden, FELICIA D   Fri Sep 08, 2014  4:50 PM)     rosuvastatin (CRESTOR) 5 MG tablet [id number] Yes TAKE 1 TABLET BY MOUTH EVERY DAY [name], MD Taking Active   sacubitril-valsartan (ENTRESTO) 97-103 MG [id number] Yes TAKE 1 TABLET BY MOUTH 2 (TWO) TIMES DAILY. PLEASE MAKE YEARLY APPT WITH [name] FOR SEPTEMBER 22 Nahser, Wonda Cheng, MD Taking Active   spironolactone (ALDACTONE) 25 MG tablet [id number] Yes Take 0.5 tablets (12.5 mg total) by mouth daily. Barrett, Felisa Bonier Taking Active   TURMERIC CURCUMIN PO [id number]  Take 1,000 mg by mouth daily. [provider]  Active Self            Patient Active Problem List   Diagnosis Date Noted   Low back pain 10/14/2019   Abdominal distention 12/26/2018   Bloating 12/26/2018   Pancreatic lesion 12/26/2018   Colon cancer screening 12/26/2018   Hx of resection of small bowel 12/26/2018   Palpitations 12/87/8676   Chronic systolic CHF (congestive heart failure) ([place]) 01/27/2017   Pleural  effusion on right 12/03/2016   CHF (congestive heart failure), NYHA class I, acute, systolic ([place]) 72/01/4708   Acute CHF ([place]) 10/26/2016   Plantar fasciitis, bilateral 03/17/2013   ANXIETY STATE, UNSPECIFIED 03/27/2009   Hyperlipidemia 05/04/2007   Benign essential HTN 05/04/2007   ALLERGIC RHINITIS 05/04/2007   Asthma 05/04/2007   Osteoarthritis 05/04/2007    Immunization History  Administered Date(s) Administered   Influenza Inj Mdck Quad Pf 03/02/2017, 04/19/2018   Influenza Split 03/04/2011, 03/12/2011   Influenza Whole 02/11/2010   Influenza,inj,Quad PF,6+ Mos 05/04/2015, 03/02/2017, 01/28/2019, 04/11/2020   Influenza,inj,quad, With Preservative 03/05/2016   Influenza-Unspecified 01/08/2016   Moderna SARS-COV2 Booster Vaccination 03/21/2020   Moderna Sars-Covid-2 Vaccination 07/11/2019, 08/03/2019   Tdap 02/19/2018   Patient has a visit with Dr. Sarajane Jews scheduled for later this week. She is still having some congestion and shortness of breath from COVID but she didn't ever get tired. She didn't feel like it slowed her down. She went to [place] recently and had a wonderful trip with lots of wine and dessert. She also has a trip planned for [place] in about a month and she is looking forward to that.   Patient denies any side effects from Zetia and will wait to see how her cholesterol is when she comes in for her physical in December.  Conditions to be addressed/monitored:  Hypertension, Hyperlipidemia, Heart Failure, Asthma and low back pain, post menopausal symptoms, dry eyes  Conditions addressed this visit: Hypertension, hyperlipidemia, asthma  Care Plan : CCM Pharmacy Care Plan  Updates made by Viona Gilmore, [place] since 01/22/2021 12:00 AM     Problem: Problem: Hypertension, Hyperlipidemia, Heart Failure, Asthma and low back pain, post menopausal symptoms, dry eyes      Long-Range Goal: Patient-Specific Goal   Start Date: 07/25/2020  Expected End Date: 07/25/2021   Recent Progress: On track  Priority: High  Note:   Current Barriers:  Unable to independently monitor therapeutic efficacy Unable to achieve control of cholesterol  Suboptimal therapeutic regimen for cholesterol  Pharmacist Clinical Goal(s):  Patient will achieve adherence  to monitoring guidelines and medication adherence to achieve therapeutic efficacy achieve control of cholesterol as evidenced by next cholesterol panl maintain control of blood pressure as evidenced by home blood pressure readings  through collaboration with PharmD and provider.   Interventions: 1:1 collaboration with [name], MD regarding development and update of comprehensive plan of care as evidenced by provider attestation and co-signature Inter-disciplinary care team collaboration (see longitudinal plan of care) Comprehensive medication review performed; medication list updated in electronic medical record  Hypertension (BP goal <140/90) -Controlled -Current treatment: bisoprolol 5mg , 1 tablet once daily  spironolactone 25mg , 1/2 tablet once daily  -Medications previously tried: amlodipine (stomach cramps, diarrhea), diltiazem, valsartan -Current home readings: HR in the 60s when relaxing; sometimes 120/70 -Current dietary habits: doesn't add salt anymore and has cut out carbs -Current exercise habits: very active with golf several days a week -Denies hypotensive/hypertensive symptoms -Educated on Daily salt intake goal < 2300 mg; Exercise goal of 150 minutes per week; Importance of home blood pressure monitoring; -Counseled to monitor BP at home weekly, document, and provide log at future appointments -Counseled on diet and exercise extensively Recommended to continue current medication  Hyperlipidemia: (LDL goal < 100) -Uncontrolled -Current treatment: rosuvastatin 5mg , 1 tablet once daily Zetia 10 mg 1 tablet daily  -Medications previously tried: atorvastatin (muscle cramps), Zetia  (unknown) -Current dietary patterns: mostly eating salads and fish and only eats bacon once a week; never eats fried foods and does not eat processed or canned foods; keto about 90% -Current exercise habits: very active with golf (3 times a week); 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 -Educated on Cholesterol goals;  Importance of limiting foods high in cholesterol; Exercise goal of 150 minutes per week; -Counseled on diet and exercise extensively Recommended to continue current medication  Heart Failure (Goal: manage symptoms and prevent exacerbations) -Controlled -Last ejection fraction:  45-50% (Date: 04/22/2018) -HF type: Systolic -NYHA Class: I (no actitivty limitation) -Current treatment: bisoprolol 5mg , 1 tablet once daily  spironolactone 25mg , 1/2 tablet once daily  Entresto 97-103mg , 1 tablet twice daily ($150 per month) -Medications previously tried: carvedilol, metoprolol -Current home BP/HR readings: 100/60s (every once in awhile drops to the 40s) but hasn't checked in the last month -Current dietary habits: limiting salt intake -Current exercise habits: very active with golf -Educated on Importance of blood pressure control -Recommended to continue current medication  Asthma (Goal: control symptoms) -Controlled -Current treatment  Albuterol 156mcg/ act inhaler, inhale 2 puffs every four hours as needed for wheezing or shortness of breath  -Medications previously tried: Flovent  -Patient denies consistent use of maintenance inhaler -Frequency of rescue inhaler use: using once a day (with COVID) -Counseled on When to use rescue inhaler -Recommended to continue current medication Recommended repeat spirometry testing.  Low back pain (Goal: minimize pain) -Controlled -Current treatment  Steroid injection  Meloxicam 15mg , 1 tablet once daily -Medications previously tried: none  -Recommended to continue current medication  Post-menopausal symptoms (Goal: minimize hot flashes and  sweating) -Controlled -Current treatment  Premarin 0.625 mg 1 tablet weekly -Medications previously tried: non-hormonal options (ineffective)  - Patient is aware of the risks but is only taking once weekly and tapering in the past was ineffective.   Health Maintenance -Vaccine gaps: pneumonia (does not want to get) -Current therapy:  Hyaluronic acid-vitamin C daily Fiber select gummies daily Multivitamin daily Turmeric daily -Educated on Cost vs benefit of each product must be carefully weighed by individual consumer -Patient is satisfied with current therapy and denies  issues -Recommended to continue current medication  Patient Goals/Self-Care Activities Patient will:  - take medications as prescribed check blood pressure weekly, document, and provide at future appointments target a minimum of 150 minutes of moderate intensity exercise weekly  Follow Up Plan: Telephone follow up appointment with care management team member scheduled for: 1 year      Medication Assistance: None required.  Patient affirms current coverage meets needs.  Compliance/Adherence/Medication fill history: Care Gaps: Shingrix, Hep C screening, COVID booster, influenza   Star-Rating Drugs: Rosuvastatin (Crestor) 5 mg - [facility] Sacubitril-Valsartan Delene Loll) 97-103 mg - Last filled [facility]-28-2020 [facility] at [facility]  Patient's preferred pharmacy is:  [id number] [place], [address] [place] [address] Phone: [phone] Fax: [phone]  [id number] [address], [place] - [place] [address] Phone: [phone] Fax: [phone]  Uses pill box? No - sets them up on the counter Pt endorses 100% compliance  We discussed: Current pharmacy is preferred with insurance plan and patient is satisfied with pharmacy services Patient decided to: Continue current medication  management strategy  Care Plan and Follow Up Patient Decision:  Patient agrees to Care Plan and Follow-up.  Plan: Telephone follow up appointment with care management team member scheduled for:  1 year  Jeni Salles, PharmD [place] Pharmacist [employer] at [place] [place] [phone]

## 2021-01-22 NOTE — Patient Instructions (Signed)
Hi Karlie,  It was great to get to catch up with you again! I will plan to check back on your cholesterol panel in December after your physical.  Please reach out to me if you have any questions or need anything before our follow up!  Best, Maddie  Jeni Salles, PharmD, [place] at [place]   Visit Information   Goals Addressed   None    Patient Care Plan: CCM Pharmacy Care Plan     Problem Identified: Problem: Hypertension, Hyperlipidemia, Heart Failure, Asthma and low back pain, post menopausal symptoms, dry eyes      Long-Range Goal: Patient-Specific Goal   Start Date: 07/25/2020  Expected End Date: 07/25/2021  Recent Progress: On track  Priority: High  Note:   Current Barriers:  Unable to independently monitor therapeutic efficacy Unable to achieve control of cholesterol  Suboptimal therapeutic regimen for cholesterol  Pharmacist Clinical Goal(s):  Patient will achieve adherence to monitoring guidelines and medication adherence to achieve therapeutic efficacy achieve control of cholesterol as evidenced by next cholesterol panl maintain control of blood pressure as evidenced by home blood pressure readings  through collaboration with PharmD and provider.   Interventions: 1:1 collaboration with [name], MD regarding development and update of comprehensive plan of care as evidenced by provider attestation and co-signature Inter-disciplinary care team collaboration (see longitudinal plan of care) Comprehensive medication review performed; medication list updated in electronic medical record  Hypertension (BP goal <140/90) -Controlled -Current treatment: bisoprolol 5mg , 1 tablet once daily  spironolactone 25mg , 1/2 tablet once daily  -Medications previously tried: amlodipine (stomach cramps, diarrhea), diltiazem, valsartan -Current home readings: HR in the 60s when relaxing; sometimes 120/70 -Current  dietary habits: doesn't add salt anymore and has cut out carbs -Current exercise habits: very active with golf several days a week -Denies hypotensive/hypertensive symptoms -Educated on Daily salt intake goal < 2300 mg; Exercise goal of 150 minutes per week; Importance of home blood pressure monitoring; -Counseled to monitor BP at home weekly, document, and provide log at future appointments -Counseled on diet and exercise extensively Recommended to continue current medication  Hyperlipidemia: (LDL goal < 100) -Uncontrolled -Current treatment: rosuvastatin 5mg , 1 tablet once daily Zetia 10 mg 1 tablet daily  -Medications previously tried: atorvastatin (muscle cramps), Zetia (unknown) -Current dietary patterns: mostly eating salads and fish and only eats bacon once a week; never eats fried foods and does not eat processed or canned foods; keto about 90% -Current exercise habits: very active with golf (3 times a week); 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 -Educated on Cholesterol goals;  Importance of limiting foods high in cholesterol; Exercise goal of 150 minutes per week; -Counseled on diet and exercise extensively Recommended to continue current medication  Heart Failure (Goal: manage symptoms and prevent exacerbations) -Controlled -Last ejection fraction:  45-50% (Date: 04/22/2018) -HF type: Systolic -NYHA Class: I (no actitivty limitation) -Current treatment: bisoprolol 5mg , 1 tablet once daily  spironolactone 25mg , 1/2 tablet once daily  Entresto 97-103mg , 1 tablet twice daily ($150 per month) -Medications previously tried: carvedilol, metoprolol -Current home BP/HR readings: 100/60s (every once in awhile drops to the 40s) but hasn't checked in the last month -Current dietary habits: limiting salt intake -Current exercise habits: very active with golf -Educated on Importance of blood pressure control -Recommended to continue current medication  Asthma (Goal: control  symptoms) -Controlled -Current treatment  Albuterol 125mcg/ act inhaler, inhale 2 puffs every four hours as needed for wheezing or shortness of breath  -  Medications previously tried: Barista  -Patient denies consistent use of maintenance inhaler -Frequency of rescue inhaler use: using once a day (with COVID) -Counseled on When to use rescue inhaler -Recommended to continue current medication Recommended repeat spirometry testing.  Low back pain (Goal: minimize pain) -Controlled -Current treatment  Steroid injection  Meloxicam 15mg , 1 tablet once daily -Medications previously tried: none  -Recommended to continue current medication  Post-menopausal symptoms (Goal: minimize hot flashes and sweating) -Controlled -Current treatment  Premarin 0.625 mg 1 tablet weekly -Medications previously tried: non-hormonal options (ineffective)  - Patient is aware of the risks but is only taking once weekly and tapering in the past was ineffective.   Health Maintenance -Vaccine gaps: pneumonia (does not want to get) -Current therapy:  Hyaluronic acid-vitamin C daily Fiber select gummies daily Multivitamin daily Turmeric daily -Educated on Cost vs benefit of each product must be carefully weighed by individual consumer -Patient is satisfied with current therapy and denies issues -Recommended to continue current medication  Patient Goals/Self-Care Activities Patient will:  - take medications as prescribed check blood pressure weekly, document, and provide at future appointments target a minimum of 150 minutes of moderate intensity exercise weekly  Follow Up Plan: Telephone follow up appointment with care management team member scheduled for: 1 year       Patient verbalizes understanding of instructions provided today and agrees to view in [place].  Telephone follow up appointment with pharmacy team member scheduled for: 1 year  Viona Gilmore, [facility]

## 2021-01-23 ENCOUNTER — Other Ambulatory Visit: Payer: Self-pay | Admitting: Family Medicine

## 2021-01-25 ENCOUNTER — Encounter: Payer: Self-pay | Admitting: Family Medicine

## 2021-01-25 ENCOUNTER — Other Ambulatory Visit: Payer: Self-pay

## 2021-01-25 ENCOUNTER — Ambulatory Visit (INDEPENDENT_AMBULATORY_CARE_PROVIDER_SITE_OTHER): Payer: PPO | Admitting: Family Medicine

## 2021-01-25 VITALS — BP 118/70 | HR 72 | Temp 98.5°F | Wt 173.0 lb

## 2021-01-25 DIAGNOSIS — J4520 Mild intermittent asthma, uncomplicated: Secondary | ICD-10-CM

## 2021-01-25 DIAGNOSIS — U071 COVID-19: Secondary | ICD-10-CM

## 2021-01-25 NOTE — Progress Notes (Signed)
   Subjective:    Patient ID: Tina May, female    DOB: 06-12-1945, 75 y.o.   MRN: [id number]  HPI Here to check on her asthma after having a Covid-19 infection last month. About 5 weeks ago after she returned home from a trip to [place] she developed a stuffy head and a dry cough. No chest pain or fever. No more SOB than usual. On 12-19-20 she went to an urgent care and she tested positive for the Covid virus. She was not given an antiviral medication. She ended up coughing for about 4 weeks, and then the coughing stopped. However since then she has had a little more SOB on exertion than she did before. Her Ventolin inhaler works well. She only has to use it about once a week.    Review of Systems  Constitutional: Negative.   Respiratory:  Positive for shortness of breath. Negative for cough, chest tightness and wheezing.   Cardiovascular: Negative.       Objective:   Physical Exam Constitutional:      Appearance: Normal appearance. She is not ill-appearing.  Cardiovascular:     Rate and Rhythm: Normal rate and regular rhythm.     Pulses: Normal pulses.     Heart sounds: Normal heart sounds.  Pulmonary:     Effort: Pulmonary effort is normal. No respiratory distress.     Breath sounds: Normal breath sounds. No stridor. No wheezing, rhonchi or rales.  Neurological:     Mental Status: She is alert.          Assessment & Plan:  It seems her asthma was worsened slightly by the Covid-19 infection. She will monitor this and follow up as needed. We would consider starting a maintenance inhaler if she needed the rescue more than twice a week.  [name], MD

## 2021-01-29 ENCOUNTER — Other Ambulatory Visit: Payer: Self-pay | Admitting: Cardiovascular Disease

## 2021-02-01 DIAGNOSIS — I10 Essential (primary) hypertension: Secondary | ICD-10-CM

## 2021-02-01 DIAGNOSIS — E785 Hyperlipidemia, unspecified: Secondary | ICD-10-CM | POA: Diagnosis not present

## 2021-02-12 ENCOUNTER — Encounter: Payer: Self-pay | Admitting: Family Medicine

## 2021-02-12 ENCOUNTER — Ambulatory Visit (INDEPENDENT_AMBULATORY_CARE_PROVIDER_SITE_OTHER): Payer: PPO | Admitting: Family Medicine

## 2021-02-12 ENCOUNTER — Other Ambulatory Visit: Payer: Self-pay

## 2021-02-12 ENCOUNTER — Ambulatory Visit (INDEPENDENT_AMBULATORY_CARE_PROVIDER_SITE_OTHER): Payer: PPO

## 2021-02-12 VITALS — BP 124/74 | HR 71 | Temp 98.6°F | Wt 175.5 lb

## 2021-02-12 DIAGNOSIS — J45909 Unspecified asthma, uncomplicated: Secondary | ICD-10-CM | POA: Diagnosis not present

## 2021-02-12 DIAGNOSIS — Z23 Encounter for immunization: Secondary | ICD-10-CM | POA: Diagnosis not present

## 2021-02-12 DIAGNOSIS — J4520 Mild intermittent asthma, uncomplicated: Secondary | ICD-10-CM

## 2021-02-12 MED ORDER — BUDESONIDE-FORMOTEROL FUMARATE 160-4.5 MCG/ACT IN AERO: 2.0000 | INHALATION_SPRAY | Freq: Two times a day (BID) | RESPIRATORY_TRACT | 11 refills | Status: DC

## 2021-02-12 NOTE — Progress Notes (Addendum)
   Subjective:    Patient ID: Tina May, female    DOB: [birth year], 75 y.o.   MRN: [id number]  HPI Here to follow up on asthma. She has had more trouble in the past few weeks with SOB, wheezing, and coughing. No fever. She is using her albuterol 1-2 times every day.    Review of Systems  Constitutional: Negative.   Respiratory:  Positive for cough, shortness of breath and wheezing.   Cardiovascular: Negative.       Objective:   Physical Exam Constitutional:      General: She is not in acute distress.    Appearance: Normal appearance.  Cardiovascular:     Rate and Rhythm: Normal rate and regular rhythm.     Pulses: Normal pulses.     Heart sounds: Normal heart sounds.  Pulmonary:     Effort: Pulmonary effort is normal. No respiratory distress.     Breath sounds: No rhonchi or rales.     Comments: Soft expiratory wheezes  Neurological:     Mental Status: She is alert.          Assessment & Plan:  Asthma. We will start her on maintenance treatment with Symbicort 2 puffs BID. We will also send her for a CXR today.  [name], MD

## 2021-02-21 ENCOUNTER — Other Ambulatory Visit: Payer: Self-pay | Admitting: Cardiovascular Disease

## 2021-02-25 ENCOUNTER — Other Ambulatory Visit: Payer: Self-pay | Admitting: Cardiovascular Disease

## 2021-02-27 ENCOUNTER — Telehealth: Payer: Self-pay | Admitting: Physician Assistant

## 2021-02-27 DIAGNOSIS — E785 Hyperlipidemia, unspecified: Secondary | ICD-10-CM

## 2021-02-27 DIAGNOSIS — I5022 Chronic systolic (congestive) heart failure: Secondary | ICD-10-CM

## 2021-02-27 DIAGNOSIS — I10 Essential (primary) hypertension: Secondary | ICD-10-CM

## 2021-02-27 DIAGNOSIS — I5021 Acute systolic (congestive) heart failure: Secondary | ICD-10-CM

## 2021-02-27 NOTE — Telephone Encounter (Signed)
Tina May called because she is having problems w/ appts and meds.  She is in [place] from November thru May. Appts at the end of year, such as the one she has scheduled w/ Dr Acie Fredrickson in November. She will already be in [place].  She was given only 15 days of meds when she called for refills.  That was the first time she was told she needed to make her yearly appt.  She was not given an appt w/in 15 days, and will already be out of town when it comes.   She is very concerned about this, she feels the reason she is doing so well is her meds, and she is always compliant with them.    Plan:  Make her yearly appt for May, she always comes back to [place] at the end of April.  Give her refills on her Entresto, bisoprolol and spironolactone for 7 months, to expire in May 2023, when she will be able to come in.   Spoke w/ Triage nurse, she will work on this.  [name], PA-C 02/27/2021 6:15 PM

## 2021-02-28 MED ORDER — BISOPROLOL FUMARATE 5 MG PO TABS: 5.0000 mg | ORAL_TABLET | Freq: Every day | ORAL | 3 refills | Status: DC

## 2021-02-28 MED ORDER — ROSUVASTATIN CALCIUM 5 MG PO TABS: 5.0000 mg | ORAL_TABLET | Freq: Every day | ORAL | 3 refills | Status: DC

## 2021-02-28 MED ORDER — SPIRONOLACTONE 25 MG PO TABS: 12.5000 mg | ORAL_TABLET | Freq: Every day | ORAL | 3 refills | Status: DC

## 2021-02-28 MED ORDER — ENTRESTO 97-103 MG PO TABS: 1.0000 | ORAL_TABLET | Freq: Two times a day (BID) | ORAL | 3 refills | Status: DC

## 2021-02-28 NOTE — Telephone Encounter (Signed)
Per [name], This note is in addition to the message I sent yesterday  Please have her get a BMP , lipid, ALT today or tomorrow  I agree with the note by [name], PA  Refil all cardiac meds as listed until next May or June ( please give an extra month to allow for scheduling)  Please have her set up an appt in spring/ early summer .   We will plan on seeing her yearly at that tmie .    Called patient with Dr. Elmarie Shiley recommendation. Patient will come in on Monday for lab work. Sent patient refills in to her pharmacy of choice.

## 2021-02-28 NOTE — Addendum Note (Signed)
Addended by: Aris Georgia, Rangel Echeverri L on: 02/28/2021 09:13 AM   Modules accepted: Orders

## 2021-03-04 ENCOUNTER — Other Ambulatory Visit: Payer: Self-pay

## 2021-03-04 ENCOUNTER — Other Ambulatory Visit: Payer: PPO | Admitting: *Deleted

## 2021-03-04 DIAGNOSIS — E785 Hyperlipidemia, unspecified: Secondary | ICD-10-CM

## 2021-03-04 DIAGNOSIS — I5022 Chronic systolic (congestive) heart failure: Secondary | ICD-10-CM | POA: Diagnosis not present

## 2021-03-04 DIAGNOSIS — I10 Essential (primary) hypertension: Secondary | ICD-10-CM

## 2021-03-04 LAB — BASIC METABOLIC PANEL
BUN/Creatinine Ratio: 25 (ref 12–28)
BUN: 22 mg/dL (ref 8–27)
CO2: 27 mmol/L (ref 20–29)
Calcium: 9.3 mg/dL (ref 8.7–10.3)
Chloride: 103 mmol/L (ref 96–106)
Creatinine, Ser: 0.88 mg/dL (ref 0.57–1.00)
Glucose: 107 mg/dL — ABNORMAL HIGH (ref 70–99)
Potassium: 4.3 mmol/L (ref 3.5–5.2)
Sodium: 141 mmol/L (ref 134–144)
eGFR: 68 mL/min/{1.73_m2} (ref >59)

## 2021-03-04 LAB — LIPID PANEL
Chol/HDL Ratio: 2.5 ratio (ref 0.0–4.4)
Cholesterol, Total: 165 mg/dL (ref 100–199)
HDL: 66 mg/dL (ref >39)
LDL Chol Calc (NIH): 80 mg/dL (ref 0–99)
Triglycerides: 108 mg/dL (ref 0–149)
VLDL Cholesterol Cal: 19 mg/dL (ref 5–40)

## 2021-03-04 LAB — ALT: ALT: 22 IU/L (ref 0–32)

## 2021-03-05 ENCOUNTER — Telehealth: Payer: Self-pay | Admitting: Cardiovascular Disease

## 2021-03-05 DIAGNOSIS — Z1231 Encounter for screening mammogram for malignant neoplasm of breast: Secondary | ICD-10-CM | POA: Diagnosis not present

## 2021-03-05 NOTE — Telephone Encounter (Signed)
Patient is returning call to discuss lab results. 

## 2021-03-05 NOTE — Telephone Encounter (Signed)
Informed pt of results. Pt verbalized understanding. 

## 2021-03-20 ENCOUNTER — Ambulatory Visit (INDEPENDENT_AMBULATORY_CARE_PROVIDER_SITE_OTHER): Payer: PPO | Admitting: Family Medicine

## 2021-03-20 ENCOUNTER — Encounter: Payer: Self-pay | Admitting: Family Medicine

## 2021-03-20 VITALS — BP 128/78 | HR 80 | Temp 98.5°F | Ht <= 58 in | Wt 177.2 lb

## 2021-03-20 DIAGNOSIS — Z0000 Encounter for general adult medical examination without abnormal findings: Secondary | ICD-10-CM

## 2021-03-20 DIAGNOSIS — R739 Hyperglycemia, unspecified: Secondary | ICD-10-CM

## 2021-03-20 LAB — CBC WITH DIFFERENTIAL/PLATELET
Basophils Absolute: 0.1 10*3/uL (ref 0.0–0.1)
Basophils Relative: 0.8 % (ref 0.0–3.0)
Eosinophils Absolute: 0.3 10*3/uL (ref 0.0–0.7)
Eosinophils Relative: 4.3 % (ref 0.0–5.0)
HCT: 41.4 % (ref 36.0–46.0)
Hemoglobin: 13.6 g/dL (ref 12.0–15.0)
Lymphocytes Relative: 18.3 % (ref 12.0–46.0)
Lymphs Abs: 1.3 10*3/uL (ref 0.7–4.0)
MCHC: 32.8 g/dL (ref 30.0–36.0)
MCV: 96.2 fl (ref 78.0–100.0)
Monocytes Absolute: 0.7 10*3/uL (ref 0.1–1.0)
Monocytes Relative: 9.5 % (ref 3.0–12.0)
Neutro Abs: 4.8 10*3/uL (ref 1.4–7.7)
Neutrophils Relative %: 67.1 % (ref 43.0–77.0)
Platelets: 247 10*3/uL (ref 150.0–400.0)
RBC: 4.3 Mil/uL (ref 3.87–5.11)
RDW: 14.6 % (ref 11.5–15.5)
WBC: 7.1 10*3/uL (ref 4.0–10.5)

## 2021-03-20 LAB — HEPATIC FUNCTION PANEL
ALT: 26 U/L (ref 0–35)
AST: 26 U/L (ref 0–37)
Albumin: 4.6 g/dL (ref 3.5–5.2)
Alkaline Phosphatase: 55 U/L (ref 39–117)
Bilirubin, Direct: 0.1 mg/dL (ref 0.0–0.3)
Total Bilirubin: 0.4 mg/dL (ref 0.2–1.2)
Total Protein: 7.2 g/dL (ref 6.0–8.3)

## 2021-03-20 LAB — TSH: TSH: 1.68 u[IU]/mL (ref 0.35–5.50)

## 2021-03-20 LAB — HEMOGLOBIN A1C: Hgb A1c MFr Bld: 5.9 % (ref 4.6–6.5)

## 2021-03-20 MED ORDER — MELOXICAM 15 MG PO TABS: 15.0000 mg | ORAL_TABLET | Freq: Every day | ORAL | 3 refills | Status: DC

## 2021-03-20 NOTE — Progress Notes (Signed)
   Subjective:    Patient ID: Tina May, female    DOB: February 09, 1946, 75 y.o.   MRN: [id number]  HPI Here for a well exam. She feels great. We started her on Symbicort last month for her asthma, and this has worked extremely well. She has not used her rescue inhaler once since then. She just returned from a trip to [place] and this went well. She had lipids checked by [name] on 03-04-21 and her LDL was 80.    Review of Systems  Constitutional: Negative.   HENT: Negative.    Eyes: Negative.   Respiratory: Negative.    Cardiovascular: Negative.   Gastrointestinal: Negative.   Genitourinary:  Negative for decreased urine volume, difficulty urinating, dyspareunia, dysuria, enuresis, flank pain, frequency, hematuria, pelvic pain and urgency.  Musculoskeletal: Negative.   Skin: Negative.   Neurological: Negative.  Negative for headaches.  Psychiatric/Behavioral: Negative.        Objective:   Physical Exam Constitutional:      General: She is not in acute distress.    Appearance: Normal appearance. She is well-developed.  HENT:     Head: Normocephalic and atraumatic.     Right Ear: External ear normal.     Left Ear: External ear normal.     Nose: Nose normal.     Mouth/Throat:     Pharynx: No oropharyngeal exudate.  Eyes:     General: No scleral icterus.    Conjunctiva/sclera: Conjunctivae normal.     Pupils: Pupils are equal, round, and reactive to light.  Neck:     Thyroid: No thyromegaly.     Vascular: No JVD.  Cardiovascular:     Rate and Rhythm: Normal rate and regular rhythm.     Heart sounds: Normal heart sounds. No murmur heard.   No friction rub. No gallop.  Pulmonary:     Effort: Pulmonary effort is normal. No respiratory distress.     Breath sounds: Normal breath sounds. No wheezing or rales.  Chest:     Chest wall: No tenderness.  Abdominal:     General: Bowel sounds are normal. There is no distension.     Palpations: Abdomen is soft. There is no mass.      Tenderness: There is no abdominal tenderness. There is no guarding or rebound.  Musculoskeletal:        General: No tenderness. Normal range of motion.     Cervical back: Normal range of motion and neck supple.  Lymphadenopathy:     Cervical: No cervical adenopathy.  Skin:    General: Skin is warm and dry.     Findings: No erythema or rash.  Neurological:     Mental Status: She is alert and oriented to person, place, and time.     Cranial Nerves: No cranial nerve deficit.     Motor: No abnormal muscle tone.     Coordination: Coordination normal.     Deep Tendon Reflexes: Reflexes are normal and symmetric. Reflexes normal.  Psychiatric:        Behavior: Behavior normal.        Thought Content: Thought content normal.        Judgment: Judgment normal.          Assessment & Plan:  Well exam. We discussed diet and exercise. Get labs today for TSH, liver panel, CBC, and A1c.  [name], MD

## 2021-04-08 ENCOUNTER — Ambulatory Visit: Payer: PPO | Admitting: Cardiovascular Disease

## 2021-06-12 ENCOUNTER — Telehealth: Payer: Self-pay | Admitting: Family Medicine

## 2021-06-12 NOTE — Telephone Encounter (Signed)
Patient called in requesting a referral so she could see a surgeon she previously seen in 2021 from [place].  Patient could be contacted at [phone].  Please advise.

## 2021-06-12 NOTE — Telephone Encounter (Signed)
Neurosurgery referral was sent to Spine center at [facility].

## 2021-07-17 ENCOUNTER — Other Ambulatory Visit: Payer: Self-pay | Admitting: Family Medicine

## 2021-07-18 ENCOUNTER — Telehealth: Payer: Self-pay | Admitting: Pharmacist

## 2021-07-18 NOTE — Chronic Care Management (AMB) (Signed)
? ? ?Chronic Care Management ?Pharmacy Assistant  ? ?Name: Tina May  MRN: [id number] DOB: [birth year] ? ?Reason for Encounter: Disease State General Assessment ?  ?Conditions to be addressed/monitored: ?HTN ? ?Recent office visits:  ?03/20/21 [name], MD - Patient presented for Preventative health care and other concerns. No medication changes. ? ?02/12/21  [name], MD - Patient presented for Mild intermittent asthma without complication and other concerns. Prescribed Budesonide - Formoterol Fumarate. ? ?02/01/21 [name] - Patient presented for Essential Hypertension and other concerns. No other visit details available. ? ?Recent consult visits:  ?03/05/21 Rivard, Katharine Look [facility]) - Patient presented for Mammogram. No other visit details available. ? ?Hospital visits:  ?None in previous 6 months ? ?Medications: ?Outpatient Encounter Medications as of 07/18/2021  ?Medication Sig  ? albuterol (VENTOLIN HFA) 108 (90 Base) MCG/ACT inhaler INHALE 2 PUFFS EVERY 4-6 HOURS AS NEEDED  ? bisoprolol (ZEBETA) 5 MG tablet Take 1 tablet (5 mg total) by mouth daily.  ? budesonide-formoterol (SYMBICORT) 160-4.5 MCG/ACT inhaler Inhale 2 puffs into the lungs 2 (two) times daily.  ? ezetimibe (ZETIA) 10 MG tablet TAKE 1 TABLET BY MOUTH EVERY DAY  ? FIBER SELECT GUMMIES CHEW Chew 2 tablets by mouth daily. Total daily dose '10mg'$ .  ? Hyaluronic Acid-Vitamin C (HYALURONIC ACID PO) Take 1 tablet by mouth daily. Women's Hyaluronic Acid  ? meloxicam (MOBIC) 15 MG tablet Take 1 tablet (15 mg total) by mouth daily.  ? Multiple Vitamin (MULTIVITAMIN WITH MINERALS) TABS tablet Take 1 tablet by mouth daily. One-A-Day  ? PREMARIN 0.625 MG tablet Take 0.625 mg by mouth once a week.  ? RESTASIS 0.05 % ophthalmic emulsion Place 1 drop into both eyes 2 (two) times daily.   ? rosuvastatin (CRESTOR) 5 MG tablet Take 1 tablet (5 mg total) by mouth daily.  ? sacubitril-valsartan (ENTRESTO) 97-103 MG Take 1 tablet by mouth 2 (two) times  daily.  ? spironolactone (ALDACTONE) 25 MG tablet Take 0.5 tablets (12.5 mg total) by mouth daily.  ? TURMERIC CURCUMIN PO Take 1,000 mg by mouth daily.  ? ?No facility-administered encounter medications on file as of 07/18/2021.  ?Reviewed chart prior to disease state call. Spoke with patient regarding BP ? ?Recent Office Vitals: ?BP Readings from Last 3 Encounters:  ?03/20/21 128/78  ?02/12/21 124/74  ?01/25/21 118/70  ? ?Pulse Readings from Last 3 Encounters:  ?03/20/21 80  ?02/12/21 71  ?01/25/21 72  ?  ?Wt Readings from Last 3 Encounters:  ?03/20/21 177 lb 4 oz (80.4 kg)  ?02/12/21 175 lb 8 oz (79.6 kg)  ?01/25/21 173 lb (78.5 kg)  ?  ? ?Kidney Function ?Lab Results  ?Component Value Date/Time  ? CREATININE 0.88 03/04/2021 08:34 AM  ? CREATININE 0.87 04/11/2020 08:56 AM  ? CREATININE 1.06 (H) 01/13/2020 10:36 AM  ? GFR 69.28 10/22/2018 11:39 AM  ? GFRNONAA 66 04/11/2020 08:56 AM  ? GFRAA 76 04/11/2020 08:56 AM  ? ? ?BMP Latest Ref Rng & Units 03/04/2021 04/11/2020 01/13/2020  ?Glucose 70 - 99 mg/dL 107(H) 111(H) 101(H)  ?BUN 8 - 27 mg/dL 22 24 30(H)  ?Creatinine 0.57 - 1.00 mg/dL 0.88 0.87 1.06(H)  ?BUN/Creat Ratio 12 - 28 25 NOT APPLICABLE 28  ?Sodium 134 - 144 mmol/L 141 139 139  ?Potassium 3.5 - 5.2 mmol/L 4.3 5.1 5.3(H)  ?Chloride 96 - 106 mmol/L 103 99 101  ?CO2 20 - 29 mmol/L 27 32 26  ?Calcium 8.7 - 10.3 mg/dL 9.3 9.6 9.8  ? ? ?  Current antihypertensive regimen:  ?bisoprolol '5mg'$ , 1 tablet once daily  ?spironolactone '25mg'$ , 1/2 tablet once daily  ? ? ? ?Care Gaps: ?Hepatitis C Screening - Overdue ?Zoster vaccine - Overdue ?PNA Vaccine - Overdue ?COVID Booster - Overdue ?BP- 128/78  ( 03/20/21) ?AWV-9/22 ?CCM- Did not reach ? ?Star Rating Drugs: ?Rosuvastatin 5 mg - [facility] ? ? ? ?Ned Clines CMA ?Clinical Pharmacist Assistant ?[phone] ? ?

## 2021-08-12 ENCOUNTER — Telehealth: Payer: Self-pay | Admitting: Family Medicine

## 2021-08-12 NOTE — Telephone Encounter (Signed)
Pt is calling and her insurance will not pay for generic please send name brand symbicort to  ?[id number] [place] [place] [address]:  [phone] ?Fax:  [phone] ?  ? ?

## 2021-08-13 ENCOUNTER — Other Ambulatory Visit: Payer: Self-pay

## 2021-08-13 DIAGNOSIS — J4520 Mild intermittent asthma, uncomplicated: Secondary | ICD-10-CM

## 2021-08-13 MED ORDER — SYMBICORT 160-4.5 MCG/ACT IN AERO: 2.0000 | INHALATION_SPRAY | Freq: Two times a day (BID) | RESPIRATORY_TRACT | 11 refills | Status: DC

## 2021-08-13 NOTE — Telephone Encounter (Signed)
Please refill as I directed  ?

## 2021-08-13 NOTE — Telephone Encounter (Signed)
Please change this to brand name Symbicort and call in a year supply to this pharmacy  ?

## 2021-08-13 NOTE — Telephone Encounter (Signed)
One year supply for brand name [facility] [place].   Called patient lvm that medication has been sent.  ?

## 2021-09-02 DIAGNOSIS — Z1239 Encounter for other screening for malignant neoplasm of breast: Secondary | ICD-10-CM | POA: Diagnosis not present

## 2021-09-02 DIAGNOSIS — Z79890 Hormone replacement therapy (postmenopausal): Secondary | ICD-10-CM | POA: Diagnosis not present

## 2021-09-02 DIAGNOSIS — Z01419 Encounter for gynecological examination (general) (routine) without abnormal findings: Secondary | ICD-10-CM | POA: Diagnosis not present

## 2021-09-02 DIAGNOSIS — Z13820 Encounter for screening for osteoporosis: Secondary | ICD-10-CM | POA: Diagnosis not present

## 2021-09-02 DIAGNOSIS — Z6833 Body mass index (BMI) 33.0-33.9, adult: Secondary | ICD-10-CM | POA: Diagnosis not present

## 2021-09-02 DIAGNOSIS — Z1211 Encounter for screening for malignant neoplasm of colon: Secondary | ICD-10-CM | POA: Diagnosis not present

## 2021-09-03 ENCOUNTER — Other Ambulatory Visit: Payer: Self-pay | Admitting: Obstetrics and Gynecology

## 2021-09-03 DIAGNOSIS — Z13820 Encounter for screening for osteoporosis: Secondary | ICD-10-CM

## 2021-09-09 ENCOUNTER — Encounter: Payer: Self-pay | Admitting: Family Medicine

## 2021-09-09 ENCOUNTER — Ambulatory Visit (INDEPENDENT_AMBULATORY_CARE_PROVIDER_SITE_OTHER): Payer: PPO | Admitting: Family Medicine

## 2021-09-09 VITALS — BP 108/78 | HR 73 | Temp 97.7°F | Wt 175.0 lb

## 2021-09-09 DIAGNOSIS — K5732 Diverticulitis of large intestine without perforation or abscess without bleeding: Secondary | ICD-10-CM

## 2021-09-09 MED ORDER — CIPROFLOXACIN HCL 500 MG PO TABS: 500.0000 mg | ORAL_TABLET | Freq: Two times a day (BID) | ORAL | 0 refills | Status: AC

## 2021-09-09 MED ORDER — METRONIDAZOLE 500 MG PO TABS: 500.0000 mg | ORAL_TABLET | Freq: Three times a day (TID) | ORAL | 0 refills | Status: DC

## 2021-09-09 NOTE — Progress Notes (Signed)
? ?  Subjective:  ? ? Patient ID: Tina May, female    DOB: 29-Sep-1945, 76 y.o.   MRN: [id number] ? ?HPI ?Here for 5 days of mild LLQ pain and diarrhea. No fever or nausea. No urinary symptoms. She has had diverticulitis before, and she thinks she now has it again. ? ? ?Review of Systems  ?Constitutional: Negative.   ?Respiratory: Negative.    ?Cardiovascular: Negative.   ?Gastrointestinal:  Positive for abdominal pain and diarrhea. Negative for abdominal distention, blood in stool, constipation, nausea, rectal pain and vomiting.  ?Genitourinary: Negative.   ? ?   ?Objective:  ? Physical Exam ?Constitutional:   ?   Appearance: Normal appearance. She is not ill-appearing.  ?Cardiovascular:  ?   Rate and Rhythm: Normal rate and regular rhythm.  ?   Pulses: Normal pulses.  ?   Heart sounds: Normal heart sounds.  ?Pulmonary:  ?   Effort: Pulmonary effort is normal.  ?   Breath sounds: Normal breath sounds.  ?Abdominal:  ?   General: Abdomen is flat. Bowel sounds are normal. There is no distension.  ?   Palpations: Abdomen is soft. There is no mass.  ?   Tenderness: There is no right CVA tenderness, left CVA tenderness, guarding or rebound.  ?   Hernia: No hernia is present.  ?   Comments: Mildly tender in the LLQ   ?Neurological:  ?   Mental Status: She is alert.  ? ? ? ? ? ?   ?Assessment & Plan:  ?Diverticulitis, treat with Cipro and Flagyl. Recheck as needed.  ?[name], MD ? ? ?

## 2021-09-10 DIAGNOSIS — M5030 Other cervical disc degeneration, unspecified cervical region: Secondary | ICD-10-CM | POA: Diagnosis not present

## 2021-09-10 DIAGNOSIS — M21162 Varus deformity, not elsewhere classified, left knee: Secondary | ICD-10-CM | POA: Diagnosis not present

## 2021-09-10 DIAGNOSIS — R29898 Other symptoms and signs involving the musculoskeletal system: Secondary | ICD-10-CM | POA: Diagnosis not present

## 2021-09-10 DIAGNOSIS — M21161 Varus deformity, not elsewhere classified, right knee: Secondary | ICD-10-CM | POA: Diagnosis not present

## 2021-09-10 DIAGNOSIS — Z9889 Other specified postprocedural states: Secondary | ICD-10-CM | POA: Diagnosis not present

## 2021-09-10 DIAGNOSIS — M5134 Other intervertebral disc degeneration, thoracic region: Secondary | ICD-10-CM | POA: Diagnosis not present

## 2021-09-10 DIAGNOSIS — M4807 Spinal stenosis, lumbosacral region: Secondary | ICD-10-CM | POA: Diagnosis not present

## 2021-09-10 DIAGNOSIS — M1712 Unilateral primary osteoarthritis, left knee: Secondary | ICD-10-CM | POA: Diagnosis not present

## 2021-09-10 DIAGNOSIS — M5116 Intervertebral disc disorders with radiculopathy, lumbar region: Secondary | ICD-10-CM | POA: Diagnosis not present

## 2021-09-10 DIAGNOSIS — M4316 Spondylolisthesis, lumbar region: Secondary | ICD-10-CM | POA: Diagnosis not present

## 2021-09-13 ENCOUNTER — Emergency Department (HOSPITAL_COMMUNITY)
Admission: EM | Admit: 2021-09-13 | Discharge: 2021-09-13 | Disposition: A | Discharge status: Home / Self Care | Payer: PPO | Attending: Emergency Medicine | Admitting: Emergency Medicine

## 2021-09-13 ENCOUNTER — Other Ambulatory Visit: Payer: Self-pay

## 2021-09-13 ENCOUNTER — Encounter (HOSPITAL_COMMUNITY): Payer: Self-pay

## 2021-09-13 ENCOUNTER — Emergency Department (HOSPITAL_COMMUNITY): Payer: PPO

## 2021-09-13 DIAGNOSIS — R197 Diarrhea, unspecified: Secondary | ICD-10-CM | POA: Diagnosis not present

## 2021-09-13 DIAGNOSIS — K7689 Other specified diseases of liver: Secondary | ICD-10-CM | POA: Insufficient documentation

## 2021-09-13 DIAGNOSIS — R1084 Generalized abdominal pain: Secondary | ICD-10-CM | POA: Diagnosis not present

## 2021-09-13 DIAGNOSIS — K5790 Diverticulosis of intestine, part unspecified, without perforation or abscess without bleeding: Secondary | ICD-10-CM | POA: Insufficient documentation

## 2021-09-13 DIAGNOSIS — I700 Atherosclerosis of aorta: Secondary | ICD-10-CM | POA: Diagnosis not present

## 2021-09-13 DIAGNOSIS — D72829 Elevated white blood cell count, unspecified: Secondary | ICD-10-CM | POA: Insufficient documentation

## 2021-09-13 DIAGNOSIS — N200 Calculus of kidney: Secondary | ICD-10-CM | POA: Diagnosis not present

## 2021-09-13 DIAGNOSIS — R109 Unspecified abdominal pain: Secondary | ICD-10-CM | POA: Diagnosis present

## 2021-09-13 DIAGNOSIS — K5730 Diverticulosis of large intestine without perforation or abscess without bleeding: Secondary | ICD-10-CM | POA: Diagnosis not present

## 2021-09-13 LAB — COMPREHENSIVE METABOLIC PANEL
ALT: 28 U/L (ref 0–44)
AST: 29 U/L (ref 15–41)
Albumin: 4.5 g/dL (ref 3.5–5.0)
Alkaline Phosphatase: 50 U/L (ref 38–126)
Anion gap: 11 (ref 5–15)
BUN: 48 mg/dL — ABNORMAL HIGH (ref 8–23)
CO2: 24 mmol/L (ref 22–32)
Calcium: 9.4 mg/dL (ref 8.9–10.3)
Chloride: 105 mmol/L (ref 98–111)
Creatinine, Ser: 1.14 mg/dL — ABNORMAL HIGH (ref 0.44–1.00)
GFR, Estimated: 50 mL/min — ABNORMAL LOW (ref >60)
Glucose, Bld: 147 mg/dL — ABNORMAL HIGH (ref 70–99)
Potassium: 4.7 mmol/L (ref 3.5–5.1)
Sodium: 140 mmol/L (ref 135–145)
Total Bilirubin: 0.6 mg/dL (ref 0.3–1.2)
Total Protein: 7.8 g/dL (ref 6.5–8.1)

## 2021-09-13 LAB — URINALYSIS, ROUTINE W REFLEX MICROSCOPIC
Bilirubin Urine: NEGATIVE
Glucose, UA: NEGATIVE mg/dL
Hgb urine dipstick: NEGATIVE
Ketones, ur: NEGATIVE mg/dL
Nitrite: NEGATIVE
Protein, ur: NEGATIVE mg/dL
Specific Gravity, Urine: 1.015 (ref 1.005–1.030)
pH: 5 (ref 5.0–8.0)

## 2021-09-13 LAB — LIPASE, BLOOD: Lipase: 44 U/L (ref 11–51)

## 2021-09-13 LAB — CBC
HCT: 44.5 % (ref 36.0–46.0)
Hemoglobin: 15 g/dL (ref 12.0–15.0)
MCH: 33.6 pg (ref 26.0–34.0)
MCHC: 33.7 g/dL (ref 30.0–36.0)
MCV: 99.6 fL (ref 80.0–100.0)
Platelets: 256 10*3/uL (ref 150–400)
RBC: 4.47 MIL/uL (ref 3.87–5.11)
RDW: 13.3 % (ref 11.5–15.5)
WBC: 10.6 10*3/uL — ABNORMAL HIGH (ref 4.0–10.5)
nRBC: 0 % (ref 0.0–0.2)

## 2021-09-13 MED ORDER — SODIUM CHLORIDE (PF) 0.9 % IJ SOLN
INTRAMUSCULAR | Status: AC
  Filled 2021-09-13: qty 50

## 2021-09-13 MED ORDER — ONDANSETRON 4 MG PO TBDP: 4.0000 mg | ORAL_TABLET | Freq: Three times a day (TID) | ORAL | 0 refills | Status: DC | PRN

## 2021-09-13 MED ORDER — SODIUM CHLORIDE 0.9 % IV BOLUS
500.0000 mL | Freq: Once | INTRAVENOUS | Status: AC
  Administered 2021-09-13: 500 mL via INTRAVENOUS

## 2021-09-13 MED ORDER — IOHEXOL 300 MG/ML  SOLN
100.0000 mL | Freq: Once | INTRAMUSCULAR | Status: AC | PRN
  Administered 2021-09-13: 100 mL via INTRAVENOUS

## 2021-09-13 MED ORDER — DICYCLOMINE HCL 10 MG PO CAPS
20.0000 mg | ORAL_CAPSULE | Freq: Once | ORAL | Status: AC
  Administered 2021-09-13: 20 mg via ORAL
  Filled 2021-09-13: qty 2

## 2021-09-13 MED ORDER — DICYCLOMINE HCL 20 MG PO TABS: 20.0000 mg | ORAL_TABLET | Freq: Two times a day (BID) | ORAL | 0 refills | Status: DC

## 2021-09-13 NOTE — Discharge Instructions (Addendum)
Your work-up today was quite reassuring.  I have printed you a copy of your CT scanning.  Please follow-up with your primary care doctor.  Return to the emergency room for any new or concerning symptoms.  Drink plenty of water and I do recommend drinking some Pedialyte.  We will hold off on taking any fiber supplements that he may be taking.  Hold off on any sugar free candy or sodas. ? ?Return to the ER for new or concerning symptoms. ? ?I will personally call you if your urinalysis shows any evidence of infection. ?

## 2021-09-13 NOTE — ED Triage Notes (Signed)
Pt reports diarrhea for over week and generalized abdominal pain. Denies N/V. Pt reports going to PCP and being prescribed antibiotics for diverticulitis, but her symptoms have only become worse.  ?

## 2021-09-13 NOTE — ED Provider Notes (Signed)
?[facility] DEPT ?Provider Note ? ? ?CSN: [id number] ?Arrival date & time: 09/13/21  0854 ? ?  ? ?History ? ?Chief Complaint  ?Patient presents with  ? Diarrhea  ? Abdominal Pain  ? ? ?Tina May is a 76 y.o. female. ? ? ?Diarrhea ?Associated symptoms: abdominal pain   ?Abdominal Pain ?Associated symptoms: diarrhea   ? ? ?Patient is a 76 year old female with a past medical history significant for diverticulitis presented to the emergency room today with complaints of 9 days now of loose stool seems that she is having 3-4 episodes of nonbloody and normal color stool per day.  She has some additional associated abdominal cramping but denies any fevers recent antibiotics recent travel out of the country she denies any chest pain or difficulty breathing she denies any significant fatigue states that she is active and is feeling healthy otherwise.  Denies any sugar-free gum or sodas.  No heavy alcohol use.  No other associate symptoms.  No aggravating or relieving factors. ? ?Was started on Cipro Flagyl 5/8 but stopped taking it 3 days later since it did not seem to be helping. ? ?  ? ?Home Medications ?Prior to Admission medications   ?Medication Sig Start Date End Date Taking? Authorizing Provider  ?albuterol (VENTOLIN HFA) 108 (90 Base) MCG/ACT inhaler INHALE 2 PUFFS EVERY 4-6 HOURS AS NEEDED 01/23/21   [name], MD  ?bisoprolol (ZEBETA) 5 MG tablet Take 1 tablet (5 mg total) by mouth daily. 02/28/21   Nahser, Wonda Cheng, MD  ?ciprofloxacin (CIPRO) 500 MG tablet Take 1 tablet (500 mg total) by mouth 2 (two) times daily for 10 days. 09/09/21 09/19/21  [name], MD  ?ezetimibe (ZETIA) 10 MG tablet TAKE 1 TABLET BY MOUTH EVERY DAY 07/17/21   [name], MD  ?FIBER SELECT GUMMIES CHEW Chew 2 tablets by mouth daily. Total daily dose '10mg'$ .    [provider]  ?Hyaluronic Acid-Vitamin C (HYALURONIC ACID PO) Take 1 tablet by mouth daily. Women's Hyaluronic Acid    [provider]  ?meloxicam (MOBIC) 15 MG tablet Take 1 tablet (15 mg total) by mouth daily. 03/20/21   [name], MD  ?metroNIDAZOLE (FLAGYL) 500 MG tablet Take 1 tablet (500 mg total) by mouth 3 (three) times daily. 09/09/21   [name], MD  ?Multiple Vitamin (MULTIVITAMIN WITH MINERALS) TABS tablet Take 1 tablet by mouth daily. One-A-Day    [provider]  ?PREMARIN 0.625 MG tablet Take 0.625 mg by mouth once a week. 04/19/19   [provider]  ?RESTASIS 0.05 % ophthalmic emulsion Place 1 drop into both eyes 2 (two) times daily.  09/28/13   [provider]  ?rosuvastatin (CRESTOR) 5 MG tablet Take 1 tablet (5 mg total) by mouth daily. 02/28/21   Nahser, Wonda Cheng, MD  ?sacubitril-valsartan (ENTRESTO) 97-103 MG Take 1 tablet by mouth 2 (two) times daily. 02/28/21   Nahser, Wonda Cheng, MD  ?spironolactone (ALDACTONE) 25 MG tablet Take 0.5 tablets (12.5 mg total) by mouth daily. 02/28/21   Nahser, Wonda Cheng, MD  ?Dellis Anes 160-4.5 MCG/ACT inhaler Inhale 2 puffs into the lungs 2 (two) times daily. 08/13/21   [name], MD  ?TURMERIC CURCUMIN PO Take 1,000 mg by mouth daily.    [provider]  ?   ? ?Allergies    ?Amlodipine, Atorvastatin, Diclofenac, and Morphine and related   ? ?Review of Systems   ?Review of Systems  ?Gastrointestinal:  Positive  for abdominal pain and diarrhea.  ? ?Physical Exam ?Updated Vital Signs ?BP 131/72   Pulse 80   Temp 98.1 ?F (36.7 ?C) (Oral)   Resp 18   SpO2 98%  ?Physical Exam ?Vitals and nursing note reviewed.  ?Constitutional:   ?   General: She is not in acute distress. ?   Comments: Pleasant well-appearing 76 year old.  In no acute distress.  Sitting comfortably in bed.  Able answer questions appropriately follow commands. No increased work of breathing. Speaking in full sentences.   ?HENT:  ?   Head: Normocephalic and atraumatic.  ?   Nose: Nose normal.  ?Eyes:  ?   General: No scleral icterus. ?Cardiovascular:  ?   Rate and Rhythm:  Normal rate and regular rhythm.  ?   Pulses: Normal pulses.  ?   Heart sounds: Normal heart sounds.  ?Pulmonary:  ?   Effort: Pulmonary effort is normal. No respiratory distress.  ?   Breath sounds: No wheezing.  ?Abdominal:  ?   Palpations: Abdomen is soft.  ?   Tenderness: There is abdominal tenderness in the suprapubic area.  ?   Comments: Very mild suprapubic tenderness with no guarding or rebound.  ?Musculoskeletal:  ?   Cervical back: Normal range of motion.  ?   Right lower leg: No edema.  ?   Left lower leg: No edema.  ?Skin: ?   General: Skin is warm and dry.  ?   Capillary Refill: Capillary refill takes less than 2 seconds.  ?Neurological:  ?   Mental Status: She is alert. Mental status is at baseline.  ?Psychiatric:     ?   Mood and Affect: Mood normal.     ?   Behavior: Behavior normal.  ? ? ?ED Results / Procedures / Treatments   ?Labs ?(all labs ordered are listed, but only abnormal results are displayed) ?Labs Reviewed  ?COMPREHENSIVE METABOLIC PANEL - Abnormal; Notable for the following components:  ?    Result Value  ? Glucose, Bld 147 (*)   ? BUN 48 (*)   ? Creatinine, Ser 1.14 (*)   ? GFR, Estimated 50 (*)   ? All other components within normal limits  ?CBC - Abnormal; Notable for the following components:  ? WBC 10.6 (*)   ? All other components within normal limits  ?LIPASE, BLOOD  ?URINALYSIS, ROUTINE W REFLEX MICROSCOPIC  ? ? ?EKG ?None ? ?Radiology ?CT ABDOMEN PELVIS W CONTRAST ? ?Result Date: 09/13/2021 ?CLINICAL DATA:  LLQ abdominal pain likely diverticu - on abx for 3 days no improvement EXAM: CT ABDOMEN AND PELVIS WITH CONTRAST TECHNIQUE: Multidetector CT imaging of the abdomen and pelvis was performed using the standard protocol following bolus administration of intravenous contrast. RADIATION DOSE REDUCTION: This exam was performed according to the departmental dose-optimization program which includes automated exposure control, adjustment of the mA and/or kV according to patient size  and/or use of iterative reconstruction technique. CONTRAST:  113m OMNIPAQUE IOHEXOL 300 MG/ML  SOLN COMPARISON:  CT abdomen/pelvis October 27, 2018. FINDINGS: Lower chest: Atelectasis at the lung bases. Otherwise, visualized lung bases are clear. No pleural effusions. Hepatobiliary: Stable low density hepatic lesions, most likely cyst. No new or enlarging lesions. Gallbladder is unremarkable. Similar proximal common bile duct dilation with distal tapering. Pancreas: Unremarkable. No pancreatic ductal dilatation or surrounding inflammatory changes. Previously seen low-density lesion in the pancreatic tail is a no longer visible. Spleen: Subcentimeter hypodense lesions, incompletely characterized but likely benign. Normal splenic size. Adrenals/Urinary  Tract: Unremarkable adrenal glands. Chronic right renal cyst. No hydronephrosis. Small right interpolar renal calculus. Decompressed bladder. Stomach/Bowel: Similar appearance of the small bowel anastomosis in the mid abdomen. No dilated bowel loops to suggest obstruction. Colonic diverticulosis without evidence diverticulitis. Vascular/Lymphatic: Aortic atherosclerosis. No enlarged abdominal or pelvic lymph nodes. Reproductive: Status post hysterectomy. The previously described right adnexal cystic lesion is decreased in size. Similar calcification along the right aspect of the vaginal cuff. Other: No abdominal wall hernia or abnormality. No abdominopelvic ascites. Musculoskeletal: Transitional lumbosacral anatomy. Multilevel degenerative change in the lumbar spine. IMPRESSION: 1. No evidence of acute abnormality to explain the patient's pain. The small bowel anastomosis appears similar. 2. Diverticulosis without evidence of diverticulitis. 3. Previously described right adnexal cystic lesion is decreased in size. 4. Previously described pancreatic tail cystic lesion is no longer visualized. 5. Hepatic and renal cysts. 6.  Aortic Atherosclerosis (ICD10-I70.0).  Electronically Signed   By: Margaretha Sheffield M.D.   On: 09/13/2021 13:12   ? ?Procedures ?Procedures  ? ? ?Medications Ordered in ED ?Medications  ?sodium chloride (PF) 0.9 % injection (  Not Given 09/13/21 1235)  ?dicy

## 2021-09-13 NOTE — ED Notes (Signed)
Pt unable to void at this time, will try later ?

## 2021-10-10 ENCOUNTER — Encounter: Payer: Self-pay | Admitting: Cardiovascular Disease

## 2021-10-10 NOTE — Progress Notes (Signed)
Cardiology Office Note:    Date:  10/11/2021   ID:  Tina May, Tina May 1945-11-03, MRN [id number]  PCP:  [name], MD  Cardiologist:  Mertie Moores, MD    Referring MD: [name], MD   Problem List 1. Chronic systolic CHF  2. Essential HTN 3. Hyperlipidemia 4. COPD  5. Pancreatic lesion:  Chief Complaint  Patient presents with   Congestive Heart Failure         Tina May is a 76 y.o. female with a hx of chronic systolic CHF in June, 4401.  I saw her in consultation while in the hospital .  EF by echo at that time was 25-30%.Her last week and is unsure and it looks her heart function is improving as we started at the 25-30 range 45 kg  She has been on Entresto 24-26 mg BID Coreg 6.25 BID Lasix 40 mg a day   Feeling much better Cath 01/27/17 showed mild RCA 10% stenosis - otherwise normal coronaries.  Feeling great. Tolerating the Entresto  No shortness of breath   Aug. 30, 2019:  Tina May is seen today for follow-up of her congestive heart failure.  She is done very well.  She is on Entresto 97-103 mg a day.  She is tolerating it quite well. Exercising regularly  - exercise capacity is much better.   Had a great trip to [place] recently  - fished, dog sledded, rode in a helicopter.   November 12, 2018   Continues to travel.   Back to [place] this past year.   car[place] cruise, traveled to [place].   Several months ago ,  Had tachypalps .  Saw [name] , NP  . Was started on amlodipine ( caused upset stomach, diarrhea )  Has continued to have GI problems.   She thinks it her carvedilol ( process of elimination )  She did a [facility] screening test.  She was found to have very mild right carotid artery disease.  Her ankle-brachial indexes were normal.  Ultrasound of the aorta was normal.  BMI was 32.  She needs to have GYN surgery ,   Needs oopherectomy. [name] No CP , no dypnsea She is at low risk for her upcoming GYN surgery .   February 14, 2019: She is seen today for follow-up of her chronic systolic congestive heart failure.  She has not tolerated carvedilol so we changed to Toprol-XL.  The Toprol caused similar GI upset.  Changed to Bisoprolol  breathing is great.   Is exercising .   Plays golf 3 times a week.   Rowing [place],  [place], yard work .   Sep 09, 2019: Tina May  is seen today for follow-up visit for her chronic systolic congestive heart failure. Her most recent echocardiogram was December, 2019 which revealed mildly depressed left ventricular systolic function with an ejection fraction 45 to 50%.  She has grade 1 diastolic dysfunction.  She has mild aortic regurgitation and mild mitral regurgitation.  Continues to have back issues - spinal stenosis  No CP or dyspnea  October 11, 2021 Tina May is seen for follow up of her CHF Has mild AI and mild MR  Was last seen by [name] in 2021  Feeling well, doing well     Past Medical History:  Diagnosis Date   Arthritis    Complex cyst of right ovary    DDD (degenerative disc disease), lumbar    Diverticulosis    External  hemorrhoids    History of basal cell carcinoma (BCC) excision    HTN (hypertension)    Hyperlipidemia    Intermittent palpitations    event monitor 10-27-2018 (Dr Acie Fredrickson)  SR including ST and SB with two episodes NSVT   Mild asthma    NICM (nonischemic cardiomyopathy) ([place])    Dr Acie Fredrickson, dx 06/ 2018  ef 25-30%;   stress echo 12-22-2016 no ischemia, ef 30-35%; last echo 04-22-2018 ef 45-50%; cath 01-27-2017  no sig. cad,10% pRCA to mRCA   Pancreatic lesion 03/11/2013   last CT abd. 10-27-2018, stable   Spinal stenosis of lumbar region    Systolic CHF, chronic ([place]) 10/2016   followed by Dr Acie Fredrickson;  per echo 04-22-2018 ef 45-50%, G1DD    Past Surgical History:  Procedure Laterality Date   COLONOSCOPY  01/25/2014   per Dr. [name], diverticulae only, repeat in 10 yrs    HAND TENDON SURGERY Right 2014  approx.   HEMICOLECTOMY  2005    resection colon tumor, per pt benign   LEFT HEART CATH AND CORONARY ANGIOGRAPHY N/A 01/27/2017   Procedure: LEFT HEART CATH AND CORONARY ANGIOGRAPHY;  Surgeon: Martinique, Peter M, MD;  Location: [facility];  Service: Cardiovascular;  Laterality: N/A;   ROBOTIC ASSISTED SALPINGO OOPHERECTOMY Bilateral 01/13/2019   Procedure: XI ROBOTIC ASSISTED SALPINGO OOPHORECTOMY;  Surgeon: [name], MD;  Location: [facility];  Service: Gynecology;  Laterality: Bilateral;   SPINE SURGERY  04/02/2020   lumbar spine surgery at [facility]  age 35   VAGINAL HYSTERECTOMY  1994    Current Medications: Current Meds  Medication Sig   bisoprolol (ZEBETA) 5 MG tablet Take 1 tablet (5 mg total) by mouth daily.   ezetimibe (ZETIA) 10 MG tablet TAKE 1 TABLET BY MOUTH EVERY DAY   FIBER SELECT GUMMIES CHEW Chew 2 tablets by mouth daily. Total daily dose '10mg'$ .   Hyaluronic Acid-Vitamin C (HYALURONIC ACID PO) Take 1 tablet by mouth daily. Women's Hyaluronic Acid   meloxicam (MOBIC) 15 MG tablet Take 1 tablet (15 mg total) by mouth daily.   Multiple Vitamin (MULTIVITAMIN WITH MINERALS) TABS tablet Take 1 tablet by mouth daily. One-A-Day   PREMARIN 0.625 MG tablet Take 0.625 mg by mouth once a week.   RESTASIS 0.05 % ophthalmic emulsion Place 1 drop into both eyes 2 (two) times daily.    rosuvastatin (CRESTOR) 5 MG tablet Take 1 tablet (5 mg total) by mouth daily.   sacubitril-valsartan (ENTRESTO) 97-103 MG Take 1 tablet by mouth 2 (two) times daily.   spironolactone (ALDACTONE) 25 MG tablet Take 0.5 tablets (12.5 mg total) by mouth daily.   SYMBICORT 160-4.5 MCG/ACT inhaler Inhale 2 puffs into the lungs 2 (two) times daily.   TURMERIC CURCUMIN PO Take 1,000 mg by mouth daily.     Allergies:   Amlodipine, Atorvastatin, Diclofenac, and Morphine and related   Social History   Socioeconomic History   Marital status: Widowed    Spouse name: Not on file   Number of children: 3    Years of education: Not on file   Highest education level: Not on file  Occupational History   Occupation: Retired  Tobacco Use   Smoking status: Former    Packs/day: 1.00    Years: 20.00    Total pack years: 20.00    Types: Cigarettes    Quit date: 05/06/1991    Years since quitting: 30.4   Smokeless tobacco: Never  Vaping  Use   Vaping Use: Never used  Substance and Sexual Activity   Alcohol use: Yes    Alcohol/week: 8.0 standard drinks of alcohol    Types: 7 Glasses of wine, 1 Standard drinks or equivalent per week   Drug use: Never   Sexual activity: Yes    Birth control/protection: Surgical  Other Topics Concern   Not on file  Social History Narrative   Has homes in [place] and [place], [place] (winters there).   Social Determinants of Health   Financial Resource Strain: Low Risk  (01/21/2021)   Overall Financial Resource Strain (CARDIA)    Difficulty of Paying Living Expenses: Not hard at all  Food Insecurity: No Food Insecurity (01/21/2021)   Hunger Vital Sign    Worried About Running Out of Food in the Last Year: Never true    Ran Out of Food in the Last Year: Never true  Transportation Needs: No Transportation Needs (01/21/2021)   PRAPARE - Hydrologist (Medical): No    Lack of Transportation (Non-Medical): No  Physical Activity: Sufficiently Active (01/21/2021)   Exercise Vital Sign    Days of Exercise per Week: 4 days    Minutes of Exercise per Session: 60 min  Stress: No Stress Concern Present (01/21/2021)   [place]    Feeling of Stress : Not at all  Social Connections: Moderately Integrated (01/21/2021)   Social Connection and Isolation Panel [NHANES]    Frequency of Communication with Friends and Family: Three times a week    Frequency of Social Gatherings with Friends and Family: Three times a week    Attends Religious Services: 1 to 4 times per year    Active  Member of Clubs or Organizations: Yes    Attends Archivist Meetings: More than 4 times per year    Marital Status: Widowed     Family History: The patient's family history includes Healthy in her brother and sister; Hyperlipidemia in an other family member; Hypertension in an other family member; Prostate cancer in an other family member. There is no history of Colon cancer, Esophageal cancer, Inflammatory bowel disease, Liver disease, Pancreatic cancer, Rectal cancer, or Stomach cancer. ROS:   Please see the history of present illness.     All other systems reviewed and are negative.  EKGs/Labs/Other Studies Reviewed:    The following studies were reviewed today:     Recent Labs: 03/20/2021: TSH 1.68 09/13/2021: ALT 28; BUN 48; Creatinine, Ser 1.14; Hemoglobin 15.0; Platelets 256; Potassium 4.7; Sodium 140  Recent Lipid Panel    Component Value Date/Time   CHOL 165 03/04/2021 0834   TRIG 108 03/04/2021 0834   HDL 66 03/04/2021 0834   CHOLHDL 2.5 03/04/2021 0834   CHOLHDL 3.4 04/11/2020 0856   VLDL 40.8 (H) 09/11/2017 0814   LDLCALC 80 03/04/2021 0834   LDLCALC 122 (H) 04/11/2020 0856   LDLDIRECT 113.0 09/11/2017 0814    Physical Exam: Blood pressure 114/72, pulse 74, height '5\' 2"'$  (1.575 m), weight 174 lb (78.9 kg), SpO2 97 %.  GEN:  Well nourished, well developed in no acute distress HEENT: Normal NECK: No JVD; No carotid bruits LYMPHATICS: No lymphadenopathy CARDIAC: RRR , no murmurs, rubs, gallops RESPIRATORY:  Clear to auscultation without rales, wheezing or rhonchi  ABDOMEN: Soft, non-tender, non-distended MUSCULOSKELETAL:  No edema; No deformity  SKIN: Warm and dry NEUROLOGIC:  Alert and oriented x 3   EKG:  October 11, 2021: Normal sinus rhythm at 69.  Minimal voltage criteria for left ventricular perjury.  ASSESSMENT:    1. Chronic combined systolic and diastolic heart failure ([place])     PLAN:      1.  Chronic Systolic CHF:  no symptoms .    Cont current meds.  Her breathing is better Will see in a year. Anticipate follow up echo in a year or so      2. Hyperlipidemia:    Managed by dr. Sarajane Jews     Medication Adjustments/Labs and Tests Ordered: Current medicines are reviewed at length with the patient today.  Concerns regarding medicines are outlined above.  Orders Placed This Encounter  Procedures   EKG 12-Lead   No orders of the defined types were placed in this encounter.   Signed, Mertie Moores, MD  10/11/2021 4:09 PM    [facility]

## 2021-10-11 ENCOUNTER — Ambulatory Visit: Payer: PPO | Admitting: Cardiovascular Disease

## 2021-10-11 ENCOUNTER — Encounter: Payer: Self-pay | Admitting: Cardiovascular Disease

## 2021-10-11 VITALS — BP 114/72 | HR 74 | Ht 62.0 in | Wt 174.0 lb

## 2021-10-11 DIAGNOSIS — I5042 Chronic combined systolic (congestive) and diastolic (congestive) heart failure: Secondary | ICD-10-CM | POA: Diagnosis not present

## 2021-10-11 NOTE — Patient Instructions (Signed)
Medication Instructions:  Your physician recommends that you continue on your current medications as directed. Please refer to the Current Medication list given to you today.  *If you need a refill on your cardiac medications before your next appointment, please call your pharmacy*   Lab Work: NONE If you have labs (blood work) drawn today and your tests are completely normal, you will receive your results only by: [place] (if you have MyChart) OR A paper copy in the mail If you have any lab test that is abnormal or we need to change your treatment, we will call you to review the results.   Testing/Procedures: NONE   Follow-Up: At [facility], you and your health needs are our priority.  As part of our continuing mission to provide you with exceptional heart care, we have created designated Provider Care Teams.  These Care Teams include your primary Cardiologist (physician) and Advanced Practice Providers (APPs -  Physician Assistants and Nurse Practitioners) who all work together to provide you with the care you need, when you need it.  Your next appointment:   1 year(s)  The format for your next appointment:   In Person  Provider:   Ronn Melena, Nahser {    Important Information About Sugar

## 2021-11-01 DIAGNOSIS — M4807 Spinal stenosis, lumbosacral region: Secondary | ICD-10-CM | POA: Diagnosis not present

## 2021-11-01 DIAGNOSIS — M47816 Spondylosis without myelopathy or radiculopathy, lumbar region: Secondary | ICD-10-CM | POA: Diagnosis not present

## 2021-11-01 DIAGNOSIS — R29898 Other symptoms and signs involving the musculoskeletal system: Secondary | ICD-10-CM | POA: Diagnosis not present

## 2021-11-01 DIAGNOSIS — Z9889 Other specified postprocedural states: Secondary | ICD-10-CM | POA: Diagnosis not present

## 2021-11-01 DIAGNOSIS — M8580 Other specified disorders of bone density and structure, unspecified site: Secondary | ICD-10-CM | POA: Diagnosis not present

## 2021-11-01 DIAGNOSIS — M5416 Radiculopathy, lumbar region: Secondary | ICD-10-CM | POA: Diagnosis not present

## 2021-11-01 DIAGNOSIS — Z780 Asymptomatic menopausal state: Secondary | ICD-10-CM | POA: Diagnosis not present

## 2021-11-04 DIAGNOSIS — L738 Other specified follicular disorders: Secondary | ICD-10-CM | POA: Diagnosis not present

## 2021-11-04 DIAGNOSIS — L299 Pruritus, unspecified: Secondary | ICD-10-CM | POA: Diagnosis not present

## 2021-11-04 DIAGNOSIS — Z85828 Personal history of other malignant neoplasm of skin: Secondary | ICD-10-CM | POA: Diagnosis not present

## 2021-11-04 DIAGNOSIS — Z808 Family history of malignant neoplasm of other organs or systems: Secondary | ICD-10-CM | POA: Diagnosis not present

## 2021-11-04 DIAGNOSIS — L578 Other skin changes due to chronic exposure to nonionizing radiation: Secondary | ICD-10-CM | POA: Diagnosis not present

## 2021-11-04 DIAGNOSIS — L570 Actinic keratosis: Secondary | ICD-10-CM | POA: Diagnosis not present

## 2021-12-17 ENCOUNTER — Telehealth: Payer: Self-pay | Admitting: Pharmacist

## 2021-12-17 NOTE — Chronic Care Management (AMB) (Signed)
Chronic Care Management Pharmacy Assistant   Name: Tina May  MRN: [id number] DOB: 04-18-1946  Reason for Encounter: Disease State   Conditions to be addressed/monitored: General Assessment  Recent office visits:  09/09/21 [name], MD - Patient presented for Diverticulitis of large intestine without perforation or abscess without bleeding. Prescribed Ciprofloxacin. Prescribed Metronidazole.  Recent consult visits:  11/04/21 Mariencheck, Aline August, MD  (Derm) - Patient presented for Actinic Keratosis and other concerns. No medication changes.  11/01/21 Patient presented to [facility] for MRI Lumbar Spine Without Contrast & S/P Laminectomy and other concerns. No other visit details available.  10/11/21 Nahser, Wonda Cheng, MD (Cardiology) - Patient presented for Chronic combined systolic and diastolic heart failure. Stopped Albuterol Sulfate. Stopped Dicyclomine. Stopped Metronidazole. Stopped Ondansetron.  09/10/21 Bubba Camp (Ortho) - Patient presented for S/P Laminectomy and other concerns. No other visit details available.  09/10/21 Fritzi Mandes (Radiology) - Claims encounter for Intervertebral disc disorders with radiculopathy, lumbar region and other concerns. No other visit details available.  09/02/21 Rivard, Katharine Look (OBGYN) - Claims encounter for GYN exam without abnormal findings. No other visit details available.  Hospital visits:  Medication Reconciliation was completed by comparing discharge summary, patient's EMR and Pharmacy list, and upon discussion with patient.  Patient presented to [facility] ED on 09/13/21 due to Diarrhea and other concerns. Patient was present for 5 hours.  New?Medications Started at [facility] Discharge:?? -started Bentyl 20 mg Zofran 4 mg   Medication Changes at Hospital Discharge: -Changed  none  Medications Discontinued at Hospital Discharge: -Stopped  none  Medications that remain the  same after Hospital Discharge:??  -All other medications will remain the same.    Medications: Outpatient Encounter Medications as of 12/17/2021  Medication Sig   bisoprolol (ZEBETA) 5 MG tablet Take 1 tablet (5 mg total) by mouth daily.   budesonide-formoterol (SYMBICORT) 160-4.5 MCG/ACT inhaler Inhale 2 puffs into the lungs 2 (two) times daily as needed.   ezetimibe (ZETIA) 10 MG tablet TAKE 1 TABLET BY MOUTH EVERY DAY   FIBER SELECT GUMMIES CHEW Chew 2 tablets by mouth daily. Total daily dose '10mg'$ .   Hyaluronic Acid-Vitamin C (HYALURONIC ACID PO) Take 1 tablet by mouth daily. Women's Hyaluronic Acid   meloxicam (MOBIC) 15 MG tablet Take 1 tablet (15 mg total) by mouth daily.   Multiple Vitamin (MULTIVITAMIN WITH MINERALS) TABS tablet Take 1 tablet by mouth daily. One-A-Day   PREMARIN 0.625 MG tablet Take 0.625 mg by mouth once a week.   RESTASIS 0.05 % ophthalmic emulsion Place 1 drop into both eyes 2 (two) times daily.    rosuvastatin (CRESTOR) 5 MG tablet Take 1 tablet (5 mg total) by mouth daily.   sacubitril-valsartan (ENTRESTO) 97-103 MG Take 1 tablet by mouth 2 (two) times daily.   spironolactone (ALDACTONE) 25 MG tablet Take 0.5 tablets (12.5 mg total) by mouth daily.   SYMBICORT 160-4.5 MCG/ACT inhaler Inhale 2 puffs into the lungs 2 (two) times daily.   TURMERIC CURCUMIN PO Take 1,000 mg by mouth daily.   No facility-administered encounter medications on file as of 12/17/2021.   [place] for General Review Call  Adherence Review:  Does the Clinical Pharmacist Assistant have access to adherence rates? Yes Adherence rates for STAR metric medications  Rosuvastatin 5 mg - [facility] Rosuvastatin 5 mg - [facility] Does the patient have >5 day gap  between last estimated fill dates for any of the above medications or other medication gaps? No   Disease State Questions:  Able to connect with Patient? Yes Did patient  have any problems with their health recently? No  Have you had any admissions or emergency room visits or worsening of your condition(s) since last visit? No  Have you had any visits with new specialists or providers since your last visit? No  Have you had any new health care problem(s) since your last visit? No  Have you run out of any of your medications since you last spoke with clinical pharmacist? No  Are there any medications you are not taking as prescribed? No  Are you having any issues or side effects with your medications? No Patient reports she has been on the same medications for a number of years no issues with them as of yet.  Do you have any other health concerns or questions you want to discuss with your Clinical Pharmacist before your next visit? No  Are there any health concerns that you feel we can do a better job addressing? No Patient reports she is doing fine. Are you having any problems with any of the following since the last visit:   None   12. Any falls since last visit? No   13. Any increased or uncontrolled pain since last visit? No  Details: 14. Next visit Type: office       Visit with: Back Surg/ Sept Patient declined follow up with Jeni Salles at this time.        15. Additional Details? No      Care Gaps: Hepatitis C Screening - Overdue Zoster Vaccine - Overdue PNA Vaccine - Overdue COVID Booster - Overdue Flu Vaccine - Overdue BP- 114/72 10/11/21 AWV- 9/22 CCM- Declined at this time  Star Rating Drugs: Rosuvastatin 5 mg - Last filled 09/22/21 90 DS at [facility] Pharmacist Assistant [phone]

## 2022-01-03 DIAGNOSIS — Z9889 Other specified postprocedural states: Secondary | ICD-10-CM | POA: Diagnosis not present

## 2022-01-03 DIAGNOSIS — J4520 Mild intermittent asthma, uncomplicated: Secondary | ICD-10-CM | POA: Diagnosis not present

## 2022-01-03 DIAGNOSIS — Z01818 Encounter for other preprocedural examination: Secondary | ICD-10-CM | POA: Diagnosis not present

## 2022-01-03 DIAGNOSIS — M5136 Other intervertebral disc degeneration, lumbar region: Secondary | ICD-10-CM | POA: Diagnosis not present

## 2022-01-03 DIAGNOSIS — I10 Essential (primary) hypertension: Secondary | ICD-10-CM | POA: Diagnosis not present

## 2022-01-03 DIAGNOSIS — F411 Generalized anxiety disorder: Secondary | ICD-10-CM | POA: Diagnosis not present

## 2022-01-03 DIAGNOSIS — I110 Hypertensive heart disease with heart failure: Secondary | ICD-10-CM | POA: Diagnosis not present

## 2022-01-03 DIAGNOSIS — R29898 Other symptoms and signs involving the musculoskeletal system: Secondary | ICD-10-CM | POA: Diagnosis not present

## 2022-01-03 DIAGNOSIS — I5022 Chronic systolic (congestive) heart failure: Secondary | ICD-10-CM | POA: Diagnosis not present

## 2022-01-03 DIAGNOSIS — M4800 Spinal stenosis, site unspecified: Secondary | ICD-10-CM | POA: Diagnosis not present

## 2022-01-14 ENCOUNTER — Telehealth: Payer: Self-pay | Admitting: Family Medicine

## 2022-01-14 NOTE — Telephone Encounter (Signed)
Left message for patient to call back and schedule Medicare Annual Wellness Visit (AWV) either virtually or in office. Left  my Herbie Drape number [phone]   Last AWV ; 01/21/21 please schedule at anytime with [facility] Nurse Health Advisor 1 or 2

## 2022-01-15 NOTE — Telephone Encounter (Signed)
Patient returned my call.  She stated she is having surgery and would like a call back mid oct

## 2022-01-17 ENCOUNTER — Other Ambulatory Visit: Payer: Self-pay | Admitting: Family Medicine

## 2022-01-20 DIAGNOSIS — Z981 Arthrodesis status: Secondary | ICD-10-CM | POA: Diagnosis not present

## 2022-01-20 DIAGNOSIS — I5022 Chronic systolic (congestive) heart failure: Secondary | ICD-10-CM | POA: Diagnosis not present

## 2022-01-20 DIAGNOSIS — Z87891 Personal history of nicotine dependence: Secondary | ICD-10-CM | POA: Diagnosis not present

## 2022-01-20 DIAGNOSIS — Z79899 Other long term (current) drug therapy: Secondary | ICD-10-CM | POA: Diagnosis not present

## 2022-01-20 DIAGNOSIS — Z7951 Long term (current) use of inhaled steroids: Secondary | ICD-10-CM | POA: Diagnosis not present

## 2022-01-20 DIAGNOSIS — M4807 Spinal stenosis, lumbosacral region: Secondary | ICD-10-CM | POA: Diagnosis not present

## 2022-01-20 DIAGNOSIS — R519 Headache, unspecified: Secondary | ICD-10-CM | POA: Diagnosis not present

## 2022-01-20 DIAGNOSIS — G8929 Other chronic pain: Secondary | ICD-10-CM | POA: Diagnosis not present

## 2022-01-20 DIAGNOSIS — R112 Nausea with vomiting, unspecified: Secondary | ICD-10-CM | POA: Diagnosis not present

## 2022-01-20 DIAGNOSIS — M47816 Spondylosis without myelopathy or radiculopathy, lumbar region: Secondary | ICD-10-CM | POA: Diagnosis not present

## 2022-01-20 DIAGNOSIS — Z9049 Acquired absence of other specified parts of digestive tract: Secondary | ICD-10-CM | POA: Diagnosis not present

## 2022-01-20 DIAGNOSIS — J45909 Unspecified asthma, uncomplicated: Secondary | ICD-10-CM | POA: Diagnosis not present

## 2022-01-20 DIAGNOSIS — E669 Obesity, unspecified: Secondary | ICD-10-CM | POA: Diagnosis not present

## 2022-01-20 DIAGNOSIS — G9741 Accidental puncture or laceration of dura during a procedure: Secondary | ICD-10-CM | POA: Diagnosis not present

## 2022-01-20 DIAGNOSIS — M1990 Unspecified osteoarthritis, unspecified site: Secondary | ICD-10-CM | POA: Diagnosis not present

## 2022-01-20 DIAGNOSIS — G8918 Other acute postprocedural pain: Secondary | ICD-10-CM | POA: Diagnosis not present

## 2022-01-20 DIAGNOSIS — M5136 Other intervertebral disc degeneration, lumbar region: Secondary | ICD-10-CM | POA: Diagnosis not present

## 2022-01-20 DIAGNOSIS — Z885 Allergy status to narcotic agent status: Secondary | ICD-10-CM | POA: Diagnosis not present

## 2022-01-20 DIAGNOSIS — R531 Weakness: Secondary | ICD-10-CM | POA: Diagnosis not present

## 2022-01-20 DIAGNOSIS — M5416 Radiculopathy, lumbar region: Secondary | ICD-10-CM | POA: Diagnosis not present

## 2022-01-20 DIAGNOSIS — M48061 Spinal stenosis, lumbar region without neurogenic claudication: Secondary | ICD-10-CM | POA: Diagnosis not present

## 2022-01-20 DIAGNOSIS — I509 Heart failure, unspecified: Secondary | ICD-10-CM | POA: Diagnosis not present

## 2022-01-20 DIAGNOSIS — Z888 Allergy status to other drugs, medicaments and biological substances status: Secondary | ICD-10-CM | POA: Diagnosis not present

## 2022-01-20 DIAGNOSIS — I110 Hypertensive heart disease with heart failure: Secondary | ICD-10-CM | POA: Diagnosis not present

## 2022-01-20 DIAGNOSIS — Z6832 Body mass index (BMI) 32.0-32.9, adult: Secondary | ICD-10-CM | POA: Diagnosis not present

## 2022-01-20 DIAGNOSIS — M960 Pseudarthrosis after fusion or arthrodesis: Secondary | ICD-10-CM | POA: Diagnosis not present

## 2022-01-20 DIAGNOSIS — F419 Anxiety disorder, unspecified: Secondary | ICD-10-CM | POA: Diagnosis not present

## 2022-01-23 ENCOUNTER — Other Ambulatory Visit: Payer: Self-pay | Admitting: Family Medicine

## 2022-01-23 ENCOUNTER — Other Ambulatory Visit: Payer: Self-pay | Admitting: Cardiovascular Disease

## 2022-01-23 MED ORDER — ENTRESTO 97-103 MG PO TABS: 1.0000 | ORAL_TABLET | Freq: Two times a day (BID) | ORAL | 2 refills | Status: DC

## 2022-01-26 ENCOUNTER — Emergency Department (HOSPITAL_COMMUNITY)
Admission: EM | Admit: 2022-01-26 | Discharge: 2022-01-26 | Discharge status: Against Medical Advice | Payer: PPO | Attending: Emergency Medicine | Admitting: Emergency Medicine

## 2022-01-26 ENCOUNTER — Encounter (HOSPITAL_COMMUNITY): Payer: Self-pay

## 2022-01-26 ENCOUNTER — Other Ambulatory Visit: Payer: Self-pay

## 2022-01-26 DIAGNOSIS — R197 Diarrhea, unspecified: Secondary | ICD-10-CM | POA: Insufficient documentation

## 2022-01-26 DIAGNOSIS — R109 Unspecified abdominal pain: Secondary | ICD-10-CM | POA: Diagnosis not present

## 2022-01-26 DIAGNOSIS — R110 Nausea: Secondary | ICD-10-CM | POA: Diagnosis not present

## 2022-01-26 DIAGNOSIS — Z5321 Procedure and treatment not carried out due to patient leaving prior to being seen by health care provider: Secondary | ICD-10-CM | POA: Insufficient documentation

## 2022-01-26 DIAGNOSIS — R509 Fever, unspecified: Secondary | ICD-10-CM | POA: Diagnosis not present

## 2022-01-26 LAB — CBC WITH DIFFERENTIAL/PLATELET
Abs Immature Granulocytes: 0.05 10*3/uL (ref 0.00–0.07)
Basophils Absolute: 0.1 10*3/uL (ref 0.0–0.1)
Basophils Relative: 1 %
Eosinophils Absolute: 0.2 10*3/uL (ref 0.0–0.5)
Eosinophils Relative: 2 %
HCT: 33 % — ABNORMAL LOW (ref 36.0–46.0)
Hemoglobin: 10.7 g/dL — ABNORMAL LOW (ref 12.0–15.0)
Immature Granulocytes: 1 %
Lymphocytes Relative: 15 %
Lymphs Abs: 1.3 10*3/uL (ref 0.7–4.0)
MCH: 32 pg (ref 26.0–34.0)
MCHC: 32.4 g/dL (ref 30.0–36.0)
MCV: 98.8 fL (ref 80.0–100.0)
Monocytes Absolute: 0.9 10*3/uL (ref 0.1–1.0)
Monocytes Relative: 11 %
Neutro Abs: 6.2 10*3/uL (ref 1.7–7.7)
Neutrophils Relative %: 70 %
Platelets: 302 10*3/uL (ref 150–400)
RBC: 3.34 MIL/uL — ABNORMAL LOW (ref 3.87–5.11)
RDW: 12.9 % (ref 11.5–15.5)
WBC: 8.7 10*3/uL (ref 4.0–10.5)
nRBC: 0 % (ref 0.0–0.2)

## 2022-01-26 LAB — COMPREHENSIVE METABOLIC PANEL
ALT: 17 U/L (ref 0–44)
AST: 17 U/L (ref 15–41)
Albumin: 3.6 g/dL (ref 3.5–5.0)
Alkaline Phosphatase: 47 U/L (ref 38–126)
Anion gap: 10 (ref 5–15)
BUN: 21 mg/dL (ref 8–23)
CO2: 26 mmol/L (ref 22–32)
Calcium: 9.4 mg/dL (ref 8.9–10.3)
Chloride: 102 mmol/L (ref 98–111)
Creatinine, Ser: 0.77 mg/dL (ref 0.44–1.00)
GFR, Estimated: >60 mL/min (ref >60)
Glucose, Bld: 99 mg/dL (ref 70–99)
Potassium: 3.5 mmol/L (ref 3.5–5.1)
Sodium: 138 mmol/L (ref 135–145)
Total Bilirubin: 0.8 mg/dL (ref 0.3–1.2)
Total Protein: 6.9 g/dL (ref 6.5–8.1)

## 2022-01-26 LAB — LIPASE, BLOOD: Lipase: 31 U/L (ref 11–51)

## 2022-01-26 NOTE — ED Triage Notes (Addendum)
Pt reports having back surgery at [facility] a week ago. Pt is now having abdominal pain, nausea, and fever x 2 days. Pt reports taking Imodium for the diarrhea.

## 2022-01-26 NOTE — ED Notes (Signed)
Registration mentioned that the patients daughter that they are leaving.

## 2022-01-27 ENCOUNTER — Telehealth: Payer: Self-pay

## 2022-01-27 ENCOUNTER — Encounter: Payer: Self-pay | Admitting: Family Medicine

## 2022-01-27 ENCOUNTER — Ambulatory Visit (INDEPENDENT_AMBULATORY_CARE_PROVIDER_SITE_OTHER): Payer: PPO | Admitting: Family Medicine

## 2022-01-27 VITALS — BP 130/80 | HR 61 | Temp 98.6°F | Wt 174.4 lb

## 2022-01-27 DIAGNOSIS — N390 Urinary tract infection, site not specified: Secondary | ICD-10-CM | POA: Diagnosis not present

## 2022-01-27 DIAGNOSIS — M48061 Spinal stenosis, lumbar region without neurogenic claudication: Secondary | ICD-10-CM

## 2022-01-27 LAB — POC URINALSYSI DIPSTICK (AUTOMATED)
Bilirubin, UA: NEGATIVE
Blood, UA: NEGATIVE
Glucose, UA: NEGATIVE
Leukocytes, UA: NEGATIVE
Nitrite, UA: NEGATIVE
Protein, UA: POSITIVE — AB
Spec Grav, UA: 1.02 (ref 1.010–1.025)
Urobilinogen, UA: 0.2 E.U./dL
pH, UA: 6 (ref 5.0–8.0)

## 2022-01-27 MED ORDER — CIPROFLOXACIN HCL 500 MG PO TABS: 500.0000 mg | ORAL_TABLET | Freq: Two times a day (BID) | ORAL | 0 refills | Status: DC

## 2022-01-27 NOTE — Addendum Note (Signed)
Addended by: Wyvonne Lenz on: 01/27/2022 04:49 PM   Modules accepted: Orders

## 2022-01-27 NOTE — Telephone Encounter (Signed)
--  Caller states that she has lower abd pain that is severe. States back surgery a week ago (states pain is unrelated.) States it may be due to the meds they had her on.  01/27/2022 8:16:32 AM Go to ED Now (or PCP triage) Waymond Cera, RN, Dalene Carrow User: Sharol Given, RN Date/Time Eilene Ghazi Time): 01/27/2022 8:19:13 AM Caller states tried to go to the ER last night and the wait was too long. She wants to be seen in the office. Advised risk of delaying care is getting worse/possible death. Verbalizes understanding.  User: Sharol Given, RN Date/Time Eilene Ghazi Time): 01/27/2022 8:21:44 AM Called backline advised of situation. Will call pt back.  Referrals GO TO FACILITY REFUSED  Pt has appt with PCP today at 2:15

## 2022-01-27 NOTE — Progress Notes (Signed)
   Subjective:    Patient ID: Tina May, female    DOB: January 23, 1946, 76 y.o.   MRN: [id number]  HPI Here with her daughter to follow up a recent spine surgery and for 3 days of mild lower abdominal pains she was at [facility] from 01-20-22 to 9-20 23 for a fusion from L2-L5. The surgery went well. She was given a single dose of antibiotic before the surgery but none after. and she has been doing some walking at home with minimal back pain. However for the past 3 days she has had intermittent dull pains in the lower abdomen. No urinary urgency or burning. She had some loose stools the first few days she was home, but now they are regular. No nausea or vomiting or fever. Her appetite is good. We treated her for diverticulitis this past May, and she says her current pains are completely different.    Review of Systems  Constitutional: Negative.   Respiratory: Negative.    Cardiovascular: Negative.   Gastrointestinal:  Positive for abdominal pain. Negative for abdominal distention, blood in stool, constipation, diarrhea, nausea and vomiting.  Genitourinary: Negative.        Objective:   Physical Exam Constitutional:      Comments: Walks with a cane   Cardiovascular:     Rate and Rhythm: Normal rate and regular rhythm.     Pulses: Normal pulses.     Heart sounds: Normal heart sounds.  Pulmonary:     Effort: Pulmonary effort is normal.     Breath sounds: Normal breath sounds.  Abdominal:     General: Abdomen is flat. Bowel sounds are normal. There is no distension.     Palpations: Abdomen is soft. There is no mass.     Tenderness: There is no right CVA tenderness, left CVA tenderness, guarding or rebound.     Hernia: No hernia is present.     Comments: Mild suprapubic tenderness   Neurological:     Mental Status: She is alert.           Assessment & Plan:  This is likely a UTI. Treat with 7 days of Cipro. Drink lots of water. We will culture the sample. We spent a total of ( 31  )  minutes reviewing records and discussing these issues.  [name], MD

## 2022-01-27 NOTE — Addendum Note (Signed)
Addended by: Rosalyn Gess D on: 01/27/2022 03:45 PM   Modules accepted: Orders

## 2022-01-29 LAB — URINE CULTURE
MICRO NUMBER:: 13965942
SPECIMEN QUALITY:: ADEQUATE

## 2022-02-03 DIAGNOSIS — Z885 Allergy status to narcotic agent status: Secondary | ICD-10-CM | POA: Diagnosis not present

## 2022-02-03 DIAGNOSIS — J45909 Unspecified asthma, uncomplicated: Secondary | ICD-10-CM | POA: Diagnosis not present

## 2022-02-03 DIAGNOSIS — Z888 Allergy status to other drugs, medicaments and biological substances status: Secondary | ICD-10-CM | POA: Diagnosis not present

## 2022-02-03 DIAGNOSIS — Z981 Arthrodesis status: Secondary | ICD-10-CM | POA: Diagnosis not present

## 2022-02-03 DIAGNOSIS — M5136 Other intervertebral disc degeneration, lumbar region: Secondary | ICD-10-CM | POA: Diagnosis not present

## 2022-02-03 DIAGNOSIS — I110 Hypertensive heart disease with heart failure: Secondary | ICD-10-CM | POA: Diagnosis not present

## 2022-02-03 DIAGNOSIS — Z79899 Other long term (current) drug therapy: Secondary | ICD-10-CM | POA: Diagnosis not present

## 2022-02-03 DIAGNOSIS — I5042 Chronic combined systolic (congestive) and diastolic (congestive) heart failure: Secondary | ICD-10-CM | POA: Diagnosis not present

## 2022-02-03 DIAGNOSIS — M4856XA Collapsed vertebra, not elsewhere classified, lumbar region, initial encounter for fracture: Secondary | ICD-10-CM | POA: Diagnosis not present

## 2022-02-03 DIAGNOSIS — M4317 Spondylolisthesis, lumbosacral region: Secondary | ICD-10-CM | POA: Diagnosis not present

## 2022-02-03 DIAGNOSIS — Z7951 Long term (current) use of inhaled steroids: Secondary | ICD-10-CM | POA: Diagnosis not present

## 2022-02-03 DIAGNOSIS — Z87891 Personal history of nicotine dependence: Secondary | ICD-10-CM | POA: Diagnosis not present

## 2022-02-03 DIAGNOSIS — X58XXXA Exposure to other specified factors, initial encounter: Secondary | ICD-10-CM | POA: Diagnosis not present

## 2022-02-03 DIAGNOSIS — Z7982 Long term (current) use of aspirin: Secondary | ICD-10-CM | POA: Diagnosis not present

## 2022-02-03 DIAGNOSIS — Z90710 Acquired absence of both cervix and uterus: Secondary | ICD-10-CM | POA: Diagnosis not present

## 2022-02-03 DIAGNOSIS — S32059A Unspecified fracture of fifth lumbar vertebra, initial encounter for closed fracture: Secondary | ICD-10-CM | POA: Diagnosis not present

## 2022-02-03 DIAGNOSIS — M4857XA Collapsed vertebra, not elsewhere classified, lumbosacral region, initial encounter for fracture: Secondary | ICD-10-CM | POA: Diagnosis not present

## 2022-02-03 DIAGNOSIS — Y33XXXA Other specified events, undetermined intent, initial encounter: Secondary | ICD-10-CM | POA: Diagnosis not present

## 2022-02-03 DIAGNOSIS — M5134 Other intervertebral disc degeneration, thoracic region: Secondary | ICD-10-CM | POA: Diagnosis not present

## 2022-02-03 DIAGNOSIS — M5030 Other cervical disc degeneration, unspecified cervical region: Secondary | ICD-10-CM | POA: Diagnosis not present

## 2022-02-03 DIAGNOSIS — Z886 Allergy status to analgesic agent status: Secondary | ICD-10-CM | POA: Diagnosis not present

## 2022-02-03 DIAGNOSIS — M4316 Spondylolisthesis, lumbar region: Secondary | ICD-10-CM | POA: Diagnosis not present

## 2022-02-03 DIAGNOSIS — G8929 Other chronic pain: Secondary | ICD-10-CM | POA: Diagnosis not present

## 2022-02-03 DIAGNOSIS — Z9889 Other specified postprocedural states: Secondary | ICD-10-CM | POA: Diagnosis not present

## 2022-02-19 ENCOUNTER — Telehealth: Payer: Self-pay

## 2022-02-19 DIAGNOSIS — Z4889 Encounter for other specified surgical aftercare: Secondary | ICD-10-CM | POA: Diagnosis not present

## 2022-02-19 DIAGNOSIS — Z4802 Encounter for removal of sutures: Secondary | ICD-10-CM | POA: Diagnosis not present

## 2022-02-19 NOTE — Patient Outreach (Signed)
  Care Coordination TOC Note Transition Care Management Unsuccessful Follow-up Telephone Call  Date of discharge and from where:  02/08/22-[facility]  Attempts:  1st Attempt  Reason for unsuccessful TCM follow-up call:  No answer/busy    Enzo Montgomery, RN,BSN,CCM [place] Management Telephonic Care Management Coordinator Direct Phone: [phone] Toll Free: [phone] Fax: [phone]

## 2022-02-20 ENCOUNTER — Telehealth: Payer: Self-pay

## 2022-02-20 NOTE — Patient Outreach (Signed)
  Care Coordination TOC Note Transition Care Management Follow-up Telephone Call Date of discharge and from where: 02/08/22-[facility] dx; lumbar laminectomy How have you been since you were released from the hospital? Patient reports she is doing good. States "the first surgery things didn't go well so they had to go back in and do surgery again." Since this second surgery she has been recovering. She is up walking without using walker. Pain is controlled and minimal.  Any questions or concerns? No  Items Reviewed: Did the pt receive and understand the discharge instructions provided? Yes Medications obtained and verified? Pt declined-states she knows her meds Other? No  Any new allergies since your discharge? No  Dietary orders reviewed? Yes Do you have support at home? Yes   Home Care and Equipment/Supplies: Were home health services ordered? not applicable If so, what is the name of the agency? N/A  Has the agency set up a time to come to the patient's home? not applicable Were any new equipment or medical supplies ordered?  Yes:   What is the name of the medical supply agency? [employer] Were you able to get the supplies/equipment? yes Do you have any questions related to the use of the equipment or supplies? No  Functional Questionnaire: (I = Independent and D = Dependent) ADLs: I  Bathing/Dressing- I  Meal Prep- I  Eating- I  Maintaining continence- I  Transferring/Ambulation- I  Managing Meds- I  Follow up appointments reviewed:  PCP Hospital f/u appt confirmed? Pt states she does not need to see PCP at this time and does not wish to make an appt. [facility] f/u appt confirmed? Yes  Saw surgeon on yesterday and had staples taken out-goes back on 03/19/22. Are transportation arrangements needed? No  If their condition worsens, is the pt aware to call PCP or go to the Emergency Dept.? Yes Was the patient provided with contact information for the PCP's office  or ED? Yes Was to pt encouraged to call back with questions or concerns? Yes  SDOH assessments and interventions completed:   Yes  Care Coordination Interventions Activated:  Yes   Care Coordination Interventions:  Education provided    Encounter Outcome:  Pt. Visit Completed    Enzo Montgomery, RN,BSN,CCM [place] [facility] Telephonic Care Management Coordinator Direct Phone: [phone] Toll Free: [phone] Fax: [phone]

## 2022-02-24 ENCOUNTER — Encounter: Payer: Self-pay | Admitting: *Deleted

## 2022-02-24 NOTE — Progress Notes (Signed)
[facility] Quality Team Note  Name: GAILENE YOUKHANA Date of Birth: [birth year] MRN: [id number] Date: 02/24/2022  [facility] Quality Team has reviewed this patient's chart, please see recommendations below:  TRC-MRP; Patient was recently discharged from hospital on (02/08/2022). Please schedule office visit or telephone call to complete medication reconciliation prior to MRP deadline (03/10/2022.).

## 2022-02-25 ENCOUNTER — Telehealth: Payer: Self-pay | Admitting: Family Medicine

## 2022-02-25 NOTE — Telephone Encounter (Signed)
Left message for patient to call back and schedule Medicare Annual Wellness Visit (AWV) either virtually or in office. Left  my jabber number [phone]   Last AWV 01/21/21 please schedule with Nurse Health Adviser   45 min for awv-i and in office appointments 30 min for awv-s  phone/virtual appointments  

## 2022-02-28 ENCOUNTER — Telehealth: Payer: Self-pay | Admitting: Pharmacist

## 2022-02-28 NOTE — Progress Notes (Signed)
[facility]) Care Management  [place]   02/28/2022  Tina May [birth year] [id number]   Reason for referral: Medication Review Post Discharge  Referral source: [facility] Current insurance:HTA  PMHx: 76 year old female with multiple medical conditions including but not lmited to:  allergic rhinitis, asthma, hypertension, CHF, hyperlipidemia, osteoarthritis, chronic back pain/spinal stenosis--status post in patient stay for laminectomy.  Objective: Lab Results  Component Value Date   CREATININE 0.77 01/26/2022   CREATININE 1.14 (H) 09/13/2021   CREATININE 0.88 03/04/2021    Lab Results  Component Value Date   HGBA1C 5.9 03/20/2021    Lipid Panel     Component Value Date/Time   CHOL 165 03/04/2021 0834   TRIG 108 03/04/2021 0834   HDL 66 03/04/2021 0834   CHOLHDL 2.5 03/04/2021 0834   CHOLHDL 3.4 04/11/2020 0856   VLDL 40.8 (H) 09/11/2017 0814   LDLCALC 80 03/04/2021 0834   LDLCALC 122 (H) 04/11/2020 0856   LDLDIRECT 113.0 09/11/2017 0814    BP Readings from Last 3 Encounters:  01/27/22 130/80  01/26/22 (!) 166/76  10/11/21 114/72    Allergies  Allergen Reactions   Amlodipine Diarrhea    And stomach cramps   Atorvastatin Other (See Comments)     muscle cramps  muscle cramps REACTION: muscle cramps   Diclofenac Nausea And Vomiting   Morphine And Related Nausea And Vomiting    Medications Reviewed Today     Reviewed by Elayne Guerin, [place] (Pharmacist) on 02/28/22 at 53  Med List Status: <None>   Medication Order Taking? Sig Documenting Provider Last Dose Status Informant  ASPIRIN 81 PO [id number] Yes Take by mouth. [provider] Taking Active            Med Note Elayne Guerin   Fri Feb 28, 2022 12:27 PM) Will stop taking in about 5 days.--Patient reports she is only supposed to take Aspirin for 30 days  bisoprolol (ZEBETA) 5 MG tablet [id number] Yes Take 1 tablet (5 mg total) by mouth daily. Nahser, Wonda Cheng, MD  Taking Active   budesonide-formoterol [facility]) 160-4.5 MCG/ACT inhaler [id number] No Inhale 2 puffs into the lungs 2 (two) times daily as needed.  Patient not taking: Reported on 02/28/2022   [provider] Not Taking Active   ciprofloxacin (CIPRO) 500 MG tablet [id number] No Take 1 tablet (500 mg total) by mouth 2 (two) times daily.  Patient not taking: Reported on 02/28/2022   [name], MD Not Taking Consider Medication Status and Discontinue (Completed Course)   ezetimibe (ZETIA) 10 MG tablet [id number] Yes TAKE 1 TABLET BY MOUTH EVERY DAY [name], MD Taking Active   FIBER SELECT [place] [id number] Yes Chew 2 tablets by mouth daily. Total daily dose '10mg'$ . [provider] Taking Active Self  Hyaluronic Acid-Vitamin C (HYALURONIC ACID PO) [id number] Yes Take 1 tablet by mouth daily. Women's Hyaluronic Acid [provider] Taking Active Self  ibuprofen (ADVIL) 400 MG tablet [id number] No Take 400 mg by mouth every 6 (six) hours as needed.  Patient not taking: Reported on 02/28/2022   [provider] Not Taking Active   Multiple Vitamin (MULTIVITAMIN WITH MINERALS) TABS tablet [id number] Yes Take 1 tablet by mouth daily. One-A-Day [provider] Taking Active Self  PREMARIN 0.625 MG tablet [id number] Yes Take 0.625 mg by mouth once a week. [provider] Taking Active   RESTASIS 0.05 % ophthalmic emulsion [id number] Yes Place 1 drop into both  eyes 2 (two) times daily.  [provider] Taking Active Self           Med Note Nyoka Cowden, FELICIA D   Fri Sep 08, 2014  4:50 PM)     rosuvastatin (CRESTOR) 5 MG tablet [id number] Yes Take 1 tablet (5 mg total) by mouth daily. Nahser, Wonda Cheng, MD Taking Active   sacubitril-valsartan [facility]) 97-103 MG [id number] Yes Take 1 tablet by mouth 2 (two) times daily. Nahser, Wonda Cheng, MD Taking Active   spironolactone (ALDACTONE) 25 MG tablet [id number] Yes TAKE 1/2 TABLET BY MOUTH EVERY DAY  Nahser, Wonda Cheng, MD Taking Active   SYMBICORT 160-4.5 MCG/ACT inhaler [id number] No Inhale 2 puffs into the lungs 2 (two) times daily.  Patient not taking: Reported on 02/28/2022   [name], MD Not Taking Active   TURMERIC CURCUMIN PO [id number] Yes Take 1,000 mg by mouth daily. [provider] Taking Active Self             ASSESSMENT: Date Discharged from Hospital: 02/08/2022 Date Medication Reconciliation Performed: 02/28/2022  Patient does not have an upcoming PCP appointment because she said she is leaving to go to [place] to live for the next 6 months. She will see her surgeon on 03/19/2022  Medications Discontinued at Discharge:  n/a    New Medications at Discharge:  acetaminophen 325 MG tablet (Completed therapy) Take 2 tablets (650 mg total) by mouth 3 (three) times daily for 10 days  HYDROcodone-acetaminophen 5-325 mg tablet (Completed therapy) Take 1 tablet by mouth every 6 (six) hours as needed for Pain for up to 7 days  methocarbamoL 500 MG tablet (Completed therapy) Take 1 tablet (500 mg total) by mouth 3 (three) times daily as needed (muscle spasms/muscle pain) for up to 10 days  sennosides-docusate 8.6-50 mg tablet (Completed therapy) Take 1 tablet by mouth 2 (two) times daily for 7 days Start taking on: February 09, 2022     Medications with Dose Adjustments at Discharge: n/a   Patient was recently discharged from hospital and all medications have been reviewed.   Medication Review Findings:  Patient reported completing the course of ciprofloxacin (still on medication list) Symbicort inhaler is on the list twice  Plan: Alert Quality Team that the MRPD has been completed.     Elayne Guerin, PharmD, [place] Clinical Pharmacist [phone]

## 2022-03-04 ENCOUNTER — Telehealth: Payer: Self-pay

## 2022-03-04 NOTE — Telephone Encounter (Signed)
Patient was recently discharged from hospital on (02/08/2022). Please schedule office visit or telephone call to complete medication reconciliation prior to MRP deadline (03/10/2022.).  LVM for pt to call office to schedule CPE with PCP before date above.

## 2022-03-07 NOTE — Telephone Encounter (Signed)
Pt has been sch for apr 2024

## 2022-03-11 DIAGNOSIS — L858 Other specified epidermal thickening: Secondary | ICD-10-CM | POA: Diagnosis not present

## 2022-03-11 DIAGNOSIS — L570 Actinic keratosis: Secondary | ICD-10-CM | POA: Diagnosis not present

## 2022-03-13 ENCOUNTER — Other Ambulatory Visit: Payer: Self-pay | Admitting: Cardiovascular Disease

## 2022-03-17 ENCOUNTER — Telehealth: Payer: Self-pay

## 2022-03-17 ENCOUNTER — Ambulatory Visit (INDEPENDENT_AMBULATORY_CARE_PROVIDER_SITE_OTHER): Payer: PPO | Admitting: Family Medicine

## 2022-03-17 ENCOUNTER — Encounter: Payer: Self-pay | Admitting: Family Medicine

## 2022-03-17 VITALS — BP 110/76 | HR 73 | Temp 97.4°F | Wt 174.2 lb

## 2022-03-17 DIAGNOSIS — M109 Gout, unspecified: Secondary | ICD-10-CM

## 2022-03-17 MED ORDER — COLCHICINE 0.6 MG PO TABS: 0.6000 mg | ORAL_TABLET | Freq: Four times a day (QID) | ORAL | 2 refills | Status: DC | PRN

## 2022-03-17 NOTE — Progress Notes (Signed)
   Subjective:    Patient ID: Tina May, female    DOB: January 19, 1946, 76 y.o.   MRN: [id number]  HPI Here for the onset yesterday of pain and swelling in the left wrist and left ankle. No recent trauma. She has never had this before.    Review of Systems  Constitutional: Negative.   Respiratory: Negative.    Cardiovascular: Negative.   Musculoskeletal:  Positive for arthralgias.       Objective:   Physical Exam Constitutional:      Appearance: Normal appearance.     Comments: Walks with a cane   Cardiovascular:     Rate and Rhythm: Normal rate and regular rhythm.     Pulses: Normal pulses.     Heart sounds: Normal heart sounds.  Pulmonary:     Effort: Pulmonary effort is normal.     Breath sounds: Normal breath sounds.  Musculoskeletal:     Comments: Left wrist and left ankle are swollen, warm, and very tender. No erythema   Neurological:     Mental Status: She is alert.           Assessment & Plan:  This is her first gout attack. Treat with Colchicine. Recheck as needed.  [name], MD

## 2022-03-17 NOTE — Telephone Encounter (Signed)
--  Caller states since Saturday she had a pain in her left wrist. States she is also having pain on the ball of her left foot. Both are swollen.States the pain was so bad she ended up taking one pain medication of Hydrocodone 5/'325MG'$  left over from back surgery, recently, and states it did not help at all.States they are both swollen. No redness/streaks.  03/17/2022 9:00:21 AM See HCP within 4 Hours (or PCP triage) Alvis Lemmings, RN, Marcie Bal  Comments User: Manning Charity, RN Date/Time Eilene Ghazi Time): 03/17/2022 9:04:57 AM called backline and spoke to [facility]. appt for pt today, 3:30pm with Dr Sarajane Jews. Advised outcome is within 4 hours,but can go to UC /ER if appt unavailable. Wants to only see Dr Sarajane Jews.  Referrals REFERRED TO PCP OFFICE Warm transfer to backline

## 2022-03-19 DIAGNOSIS — M4316 Spondylolisthesis, lumbar region: Secondary | ICD-10-CM | POA: Diagnosis not present

## 2022-03-19 DIAGNOSIS — Z981 Arthrodesis status: Secondary | ICD-10-CM | POA: Diagnosis not present

## 2022-03-29 ENCOUNTER — Other Ambulatory Visit: Payer: Self-pay | Admitting: Family Medicine

## 2022-04-02 ENCOUNTER — Telehealth: Payer: Self-pay | Admitting: Family Medicine

## 2022-04-02 NOTE — Telephone Encounter (Signed)
Left message for patient to call back and schedule Medicare Annual Wellness Visit (AWV) either virtually or in office. Left  my Herbie Drape number [phone]   Last AWV 01/21/21 please schedule with Nurse Health Adviser   45 min for awv-i and in office appointments 30 min for awv-s  phone/virtual appointments

## 2022-04-17 DIAGNOSIS — Z981 Arthrodesis status: Secondary | ICD-10-CM | POA: Diagnosis not present

## 2022-04-21 DIAGNOSIS — Z981 Arthrodesis status: Secondary | ICD-10-CM | POA: Diagnosis not present

## 2022-05-04 DIAGNOSIS — E785 Hyperlipidemia, unspecified: Secondary | ICD-10-CM | POA: Diagnosis not present

## 2022-05-04 DIAGNOSIS — Z8679 Personal history of other diseases of the circulatory system: Secondary | ICD-10-CM | POA: Diagnosis not present

## 2022-05-04 DIAGNOSIS — Z87891 Personal history of nicotine dependence: Secondary | ICD-10-CM | POA: Diagnosis not present

## 2022-05-04 DIAGNOSIS — Z8709 Personal history of other diseases of the respiratory system: Secondary | ICD-10-CM | POA: Diagnosis not present

## 2022-05-04 DIAGNOSIS — M25562 Pain in left knee: Secondary | ICD-10-CM | POA: Diagnosis not present

## 2022-05-04 DIAGNOSIS — I110 Hypertensive heart disease with heart failure: Secondary | ICD-10-CM | POA: Diagnosis not present

## 2022-05-07 ENCOUNTER — Other Ambulatory Visit: Payer: Self-pay

## 2022-05-07 ENCOUNTER — Telehealth: Payer: Self-pay | Admitting: Family Medicine

## 2022-05-07 MED ORDER — EZETIMIBE 10 MG PO TABS: 10.0000 mg | ORAL_TABLET | Freq: Every day | ORAL | 0 refills | Status: DC

## 2022-05-07 NOTE — Telephone Encounter (Signed)
ezetimibe (ZETIA) 10 MG tablet   [id number] [place]: [phone] Fax: [phone]

## 2022-05-07 NOTE — Telephone Encounter (Signed)
Done

## 2022-05-19 DIAGNOSIS — Z981 Arthrodesis status: Secondary | ICD-10-CM | POA: Diagnosis not present

## 2022-05-26 DIAGNOSIS — Z981 Arthrodesis status: Secondary | ICD-10-CM | POA: Diagnosis not present

## 2022-05-28 ENCOUNTER — Other Ambulatory Visit: Payer: Self-pay | Admitting: Cardiovascular Disease

## 2022-05-30 DIAGNOSIS — M25562 Pain in left knee: Secondary | ICD-10-CM | POA: Diagnosis not present

## 2022-05-30 DIAGNOSIS — M1712 Unilateral primary osteoarthritis, left knee: Secondary | ICD-10-CM | POA: Diagnosis not present

## 2022-06-02 DIAGNOSIS — Z981 Arthrodesis status: Secondary | ICD-10-CM | POA: Diagnosis not present

## 2022-06-09 DIAGNOSIS — Z981 Arthrodesis status: Secondary | ICD-10-CM | POA: Diagnosis not present

## 2022-06-16 ENCOUNTER — Telehealth: Payer: Self-pay

## 2022-06-16 DIAGNOSIS — Z981 Arthrodesis status: Secondary | ICD-10-CM | POA: Diagnosis not present

## 2022-06-16 NOTE — Progress Notes (Unsigned)
Patient ID: Tina May, female   DOB: 05/26/1945, 77 y.o.   MRN: [id number]  Care Management & Coordination Services Pharmacy Team  Reason for Encounter: General adherence update   Contacted patient for general health update and medication adherence call.  {US HC Outreach:28874}   What concerns do you have about your medications?  The patient {denies/reports:25180} side effects with their medications.   How often do you forget or accidentally miss a dose? {missed doses:25554}  Do you use a pillbox? {yes/no:20286}  Are you having any problems getting your medications from your pharmacy? {yes/no:20286}  Has the cost of your medications been a concern? {yes/no:20286} If yes, what medication and is patient assistance available or has it been applied for?  Since last visit with PharmD, {no/thefollowing:25210} interventions have been made.   The patient {has/has not:25209} had an ED visit since last contact.   The patient {denies/reports:25180} problems with their health.   Patient {denies/reports:25180} concerns or questions for ***, PharmD at this time.   Counseled patient on: {GENERALCOUNSELING:28686}   Chart Updates:  Recent office visits:  03/05/22 [name], MD - Patient presented for Acute gout of multiple sites unspecified cause. Prescribed Colchicine. Stopped Cipro. Stopped Ibuprofen.  01/27/22 [name], MD - Patient presented for Urinary tract infection without hematuria site unspecified and other concerns. Prescribed Cipro.     Recent consult visits:  03/19/22 Dowdall, Hulda Marin, PA - Patient presented for Status post lumbar spinal fusion. No medication changes.  03/11/22 Garry Heater (Dermatology) - Encounter for Actinic keratosis and other concerns. No other visit details available.   01/27/22 Massey-Ballamy, Velva Harman, Assistant - Patient presented for [facility] telephone visit. No medication changes.   Hospital visits:  Medication  Reconciliation was completed by comparing discharge summary, patient's EMR and Pharmacy list, and upon discussion with patient.  Patient presented to [facility] on 05/04/22 due to Knee Pain.   New?Medications Started at [facility] Discharge:?? -started predniSONE (Deltasone) 20 MG  TraMADol (Ultram) 50 MG   Medication Changes at Hospital Discharge: -Changed  none  Medications Discontinued at Hospital Discharge: -Stopped  none  Medications that remain the same after Hospital Discharge:??  -All other medications will remain the same.    Patient presented to [facility] on 02/03/22 for General Surgery S/P Lumbar Laminectomy.   New?Medications Started at [facility] Discharge:?? -started acetaminophen 325 MG tablet  methocarbamoL 500 MG tablet  HYDROcodone-acetaminophen 5-325 mg tablet  SENOKOT-S   Medication Changes at Hospital Discharge: -Changed  none  Medications Discontinued at Hospital Discharge: -Stopped  none  Medications that remain the same after Hospital Discharge:??  -All other medications will remain the same.    Patient presented to  [facility] ED at [facility] on 01/26/22 for Abdominal Pain and fever.  New?Medications Started at [facility] Discharge:?? -started none  Medication Changes at Hospital Discharge: -Changed  none  Medications Discontinued at Hospital Discharge: -Stopped  none  Medications that remain the same after Hospital Discharge:??  -All other medications will remain the same.   Patient presented to [facility] on 01/20/22 for General Surgery S/P Lumbar Laminectomy.   New?Medications Started at [facility] Discharge:?? -started Acetaminophen (Tylenol) - 983m three times daily.  Methocarbamol (Robaxin) - 7596mthree times daily. Gabapentin (Neurontin) - 30071mhree times daily.  Ibuprofen - 600-800m29mree times daily as needed for severe pain.   Medication Changes at Hospital  Discharge: -Changed  none  Medications Discontinued at Hospital Discharge: -Stopped  none  Medications that remain the same after Hospital Discharge:??  -All other medications will remain the same.    Medications: Outpatient Encounter Medications as of 06/16/2022  Medication Sig Note   ASPIRIN 81 PO Take by mouth. 02/28/2022: Will stop taking in about 5 days.--Patient reports she is only supposed to take Aspirin for 30 days   bisoprolol (ZEBETA) 5 MG tablet TAKE 1 TABLET (5 MG TOTAL) BY MOUTH DAILY.    budesonide-formoterol (SYMBICORT) 160-4.5 MCG/ACT inhaler Inhale 2 puffs into the lungs 2 (two) times daily as needed.    colchicine 0.6 MG tablet Take 1 tablet (0.6 mg total) by mouth every 6 (six) hours as needed (gout).    ezetimibe (ZETIA) 10 MG tablet Take 1 tablet (10 mg total) by mouth daily.    FIBER SELECT GUMMIES CHEW Chew 2 tablets by mouth daily. Total daily dose 13m.    Hyaluronic Acid-Vitamin C (HYALURONIC ACID PO) Take 1 tablet by mouth daily. Women's Hyaluronic Acid    Multiple Vitamin (MULTIVITAMIN WITH MINERALS) TABS tablet Take 1 tablet by mouth daily. One-A-Day    PREMARIN 0.625 MG tablet Take 0.625 mg by mouth once a week.    RESTASIS 0.05 % ophthalmic emulsion Place 1 drop into both eyes 2 (two) times daily.     rosuvastatin (CRESTOR) 5 MG tablet TAKE 1 TABLET (5 MG TOTAL) BY MOUTH DAILY.    sacubitril-valsartan (ENTRESTO) 97-103 MG Take 1 tablet by mouth 2 (two) times daily.    spironolactone (ALDACTONE) 25 MG tablet TAKE 1/2 TABLET BY MOUTH EVERY DAY    SYMBICORT 160-4.5 MCG/ACT inhaler Inhale 2 puffs into the lungs 2 (two) times daily.    TURMERIC CURCUMIN PO Take 1,000 mg by mouth daily.    No facility-administered encounter medications on file as of 06/16/2022.    Recent vitals BP Readings from Last 3 Encounters:  03/17/22 110/76  01/27/22 130/80  01/26/22 (!) 166/76   Pulse Readings from Last 3 Encounters:  03/17/22 73  01/27/22 61  01/26/22 78    Wt Readings from Last 3 Encounters:  03/17/22 174 lb 4 oz (79 kg)  01/27/22 174 lb 6 oz (79.1 kg)  01/26/22 170 lb (77.1 kg)   BMI Readings from Last 3 Encounters:  03/17/22 31.87 kg/m  01/27/22 31.89 kg/m  01/26/22 31.09 kg/m    Recent lab results    Component Value Date/Time   NA 138 01/26/2022 1947   NA 141 03/04/2021 0834   K 3.5 01/26/2022 1947   CL 102 01/26/2022 1947   CO2 26 01/26/2022 1947   GLUCOSE 99 01/26/2022 1947   BUN 21 01/26/2022 1947   BUN 22 03/04/2021 0834   CREATININE 0.77 01/26/2022 1947   CREATININE 0.87 04/11/2020 0856   CALCIUM 9.4 01/26/2022 1947    Lab Results  Component Value Date   CREATININE 0.77 01/26/2022   GFR 69.28 10/22/2018   EGFR 68 03/04/2021   GFRNONAA >60 01/26/2022   GFRAA 76 04/11/2020   Lab Results  Component Value Date/Time   HGBA1C 5.9 03/20/2021 02:04 PM   HGBA1C 5.6 04/11/2020 08:56 AM    Lab Results  Component Value Date   CHOL 165 03/04/2021   HDL 66 03/04/2021   LDLCALC 80 03/04/2021   LDLDIRECT 113.0 09/11/2017   TRIG 108 03/04/2021   CHOLHDL 2.5 03/04/2021    Care Gaps: AWV- 02/12/21 Hepatitis C Screen - Overdue Zoster Vaccine - Postponed PNA Vaccine - Postponed COVID Booster - Overdue   Star Rating Drugs:  Rosuvastatin 5 mg - Last filled 06/11/22 90 DS at [facility] Pharmacist Assistant [phone]

## 2022-06-23 DIAGNOSIS — Z981 Arthrodesis status: Secondary | ICD-10-CM | POA: Diagnosis not present

## 2022-06-26 ENCOUNTER — Other Ambulatory Visit: Payer: Self-pay | Admitting: Family Medicine

## 2022-06-27 NOTE — Telephone Encounter (Signed)
Medication currently not listed on med list Last OV-03/17/22  Next OV-09/23/22

## 2022-06-30 ENCOUNTER — Telehealth: Payer: Self-pay | Admitting: Family Medicine

## 2022-06-30 DIAGNOSIS — Z981 Arthrodesis status: Secondary | ICD-10-CM | POA: Diagnosis not present

## 2022-06-30 NOTE — Telephone Encounter (Signed)
Says she no longer needs the symbicort, needs prescription for albuterol (VENTOLIN HFA) 108 (90 Base) MCG/ACT inhaler.  [id number] [place] [phone] Fax: [phone]

## 2022-06-30 NOTE — Telephone Encounter (Signed)
Ok to send medication

## 2022-06-30 NOTE — Telephone Encounter (Signed)
Called patient to schedule Medicare Annual Wellness Visit (AWV). Left message for patient to call back and schedule Medicare Annual Wellness Visit (AWV).  Last date of AWV: 01/21/21  Please schedule an appointment at any time with [facility] or hanna kim.  If any questions, please contact me at [phone].  Thank you ,  Barkley Boards AWV direct phone # [phone]    I have left several messages lm  05/01/22  04/02/22  02/25/22  cb mid oct  having surgery 01/15/22  lm 01/14/22

## 2022-07-01 ENCOUNTER — Other Ambulatory Visit: Payer: Self-pay

## 2022-07-01 MED ORDER — ALBUTEROL SULFATE HFA 108 (90 BASE) MCG/ACT IN AERS: 2.0000 | INHALATION_SPRAY | Freq: Four times a day (QID) | RESPIRATORY_TRACT | 3 refills | Status: DC | PRN

## 2022-07-01 NOTE — Telephone Encounter (Signed)
Please refill the Ventolin

## 2022-07-01 NOTE — Telephone Encounter (Signed)
Refill sent, pt notified

## 2022-07-11 ENCOUNTER — Telehealth: Payer: Self-pay | Admitting: *Deleted

## 2022-07-11 DIAGNOSIS — M25562 Pain in left knee: Secondary | ICD-10-CM | POA: Diagnosis not present

## 2022-07-11 DIAGNOSIS — M1712 Unilateral primary osteoarthritis, left knee: Secondary | ICD-10-CM | POA: Diagnosis not present

## 2022-07-11 DIAGNOSIS — M79671 Pain in right foot: Secondary | ICD-10-CM | POA: Diagnosis not present

## 2022-07-11 DIAGNOSIS — M25571 Pain in right ankle and joints of right foot: Secondary | ICD-10-CM | POA: Diagnosis not present

## 2022-07-11 NOTE — Telephone Encounter (Signed)
Primary Cardiologist:Philip Nahser, MD   Preoperative team, please contact this patient and set up a phone call appointment for further preoperative risk assessment. Please obtain consent and complete medication review. Thank you for your help.   I confirm that guidance regarding antiplatelet and oral anticoagulation therapy has been completed and, if necessary, noted below (no cardiac indication for aspirin).    Emmaline Life, NP-C  07/11/2022, 11:55 AM [address], [address] Office [phone] Fax [phone]

## 2022-07-11 NOTE — Telephone Encounter (Signed)
Left message for patient to call our office to schedule a tele visit for pre-op clearance.

## 2022-07-11 NOTE — Telephone Encounter (Signed)
   Pre-operative Risk Assessment    Patient Name: Tina May  DOB: June 09, 1945 MRN: [id number]     Request for Surgical Clearance    Procedure:   LEFT TOTAL KNEE ARTHROPLASTY  Date of Surgery:  Clearance 08/25/22                                 Surgeon:  DR. Myer Haff Surgeon's Group or Practice Name:  [facility] Phone number:  [phone] Fax number:  [phone]   Type of Clearance Requested:   - Medical ; ASA    Type of Anesthesia:  Not Indicated; (GENERAL?)   Additional requests/questions:    Jiles Prows   07/11/2022, 11:29 AM

## 2022-07-15 NOTE — Telephone Encounter (Signed)
2nd attempt to reach pt regarding surgical clearance and the need for a tele visit.  Left message for pt to call back and ask for the preop team

## 2022-07-16 NOTE — Telephone Encounter (Signed)
Message x 3 left today. Our office has tried x 3 to reach the pt to schedule a tele pre op appt. I will send FYI to requesting office that the pt is going to need a tele visit for pre op clearance. I will remove from the pre op call back at this time. We will re-address once the pt has called back.

## 2022-07-21 DIAGNOSIS — Z981 Arthrodesis status: Secondary | ICD-10-CM | POA: Diagnosis not present

## 2022-07-23 DIAGNOSIS — M7661 Achilles tendinitis, right leg: Secondary | ICD-10-CM | POA: Diagnosis not present

## 2022-07-23 DIAGNOSIS — M76811 Anterior tibial syndrome, right leg: Secondary | ICD-10-CM | POA: Diagnosis not present

## 2022-07-28 DIAGNOSIS — I498 Other specified cardiac arrhythmias: Secondary | ICD-10-CM | POA: Diagnosis not present

## 2022-07-28 DIAGNOSIS — Z87891 Personal history of nicotine dependence: Secondary | ICD-10-CM | POA: Diagnosis not present

## 2022-07-28 DIAGNOSIS — E669 Obesity, unspecified: Secondary | ICD-10-CM | POA: Diagnosis not present

## 2022-07-28 DIAGNOSIS — I509 Heart failure, unspecified: Secondary | ICD-10-CM | POA: Diagnosis not present

## 2022-07-28 DIAGNOSIS — J4520 Mild intermittent asthma, uncomplicated: Secondary | ICD-10-CM | POA: Diagnosis not present

## 2022-07-28 DIAGNOSIS — I10 Essential (primary) hypertension: Secondary | ICD-10-CM | POA: Diagnosis not present

## 2022-07-28 DIAGNOSIS — Z981 Arthrodesis status: Secondary | ICD-10-CM | POA: Diagnosis not present

## 2022-07-28 DIAGNOSIS — M1712 Unilateral primary osteoarthritis, left knee: Secondary | ICD-10-CM | POA: Diagnosis not present

## 2022-08-03 ENCOUNTER — Other Ambulatory Visit: Payer: Self-pay | Admitting: Family Medicine

## 2022-08-04 DIAGNOSIS — Z981 Arthrodesis status: Secondary | ICD-10-CM | POA: Diagnosis not present

## 2022-08-04 DIAGNOSIS — I5020 Unspecified systolic (congestive) heart failure: Secondary | ICD-10-CM | POA: Diagnosis not present

## 2022-08-04 DIAGNOSIS — I509 Heart failure, unspecified: Secondary | ICD-10-CM | POA: Diagnosis not present

## 2022-08-08 DIAGNOSIS — I5020 Unspecified systolic (congestive) heart failure: Secondary | ICD-10-CM | POA: Diagnosis not present

## 2022-08-08 DIAGNOSIS — Z01810 Encounter for preprocedural cardiovascular examination: Secondary | ICD-10-CM | POA: Diagnosis not present

## 2022-08-08 DIAGNOSIS — I509 Heart failure, unspecified: Secondary | ICD-10-CM | POA: Diagnosis not present

## 2022-08-11 DIAGNOSIS — I509 Heart failure, unspecified: Secondary | ICD-10-CM | POA: Diagnosis not present

## 2022-08-11 DIAGNOSIS — I5020 Unspecified systolic (congestive) heart failure: Secondary | ICD-10-CM | POA: Diagnosis not present

## 2022-08-11 DIAGNOSIS — Z981 Arthrodesis status: Secondary | ICD-10-CM | POA: Diagnosis not present

## 2022-08-14 DIAGNOSIS — E119 Type 2 diabetes mellitus without complications: Secondary | ICD-10-CM | POA: Diagnosis not present

## 2022-08-14 DIAGNOSIS — N390 Urinary tract infection, site not specified: Secondary | ICD-10-CM | POA: Diagnosis not present

## 2022-08-14 DIAGNOSIS — E559 Vitamin D deficiency, unspecified: Secondary | ICD-10-CM | POA: Diagnosis not present

## 2022-08-14 DIAGNOSIS — D519 Vitamin B12 deficiency anemia, unspecified: Secondary | ICD-10-CM | POA: Diagnosis not present

## 2022-08-14 DIAGNOSIS — Z0000 Encounter for general adult medical examination without abnormal findings: Secondary | ICD-10-CM | POA: Diagnosis not present

## 2022-08-14 DIAGNOSIS — E039 Hypothyroidism, unspecified: Secondary | ICD-10-CM | POA: Diagnosis not present

## 2022-08-14 DIAGNOSIS — D689 Coagulation defect, unspecified: Secondary | ICD-10-CM | POA: Diagnosis not present

## 2022-08-14 DIAGNOSIS — E785 Hyperlipidemia, unspecified: Secondary | ICD-10-CM | POA: Diagnosis not present

## 2022-08-15 DIAGNOSIS — Z8679 Personal history of other diseases of the circulatory system: Secondary | ICD-10-CM | POA: Diagnosis not present

## 2022-08-15 DIAGNOSIS — Z87891 Personal history of nicotine dependence: Secondary | ICD-10-CM | POA: Diagnosis not present

## 2022-08-15 DIAGNOSIS — I110 Hypertensive heart disease with heart failure: Secondary | ICD-10-CM | POA: Diagnosis not present

## 2022-08-15 DIAGNOSIS — Z6832 Body mass index (BMI) 32.0-32.9, adult: Secondary | ICD-10-CM | POA: Diagnosis not present

## 2022-08-15 DIAGNOSIS — E7800 Pure hypercholesterolemia, unspecified: Secondary | ICD-10-CM | POA: Diagnosis not present

## 2022-08-15 DIAGNOSIS — J45909 Unspecified asthma, uncomplicated: Secondary | ICD-10-CM | POA: Diagnosis not present

## 2022-08-15 DIAGNOSIS — E669 Obesity, unspecified: Secondary | ICD-10-CM | POA: Diagnosis not present

## 2022-08-15 DIAGNOSIS — M1712 Unilateral primary osteoarthritis, left knee: Secondary | ICD-10-CM | POA: Diagnosis not present

## 2022-08-15 DIAGNOSIS — I5022 Chronic systolic (congestive) heart failure: Secondary | ICD-10-CM | POA: Diagnosis not present

## 2022-08-18 ENCOUNTER — Telehealth: Payer: Self-pay

## 2022-08-18 ENCOUNTER — Telehealth: Payer: Self-pay | Admitting: Family Medicine

## 2022-08-18 DIAGNOSIS — Z981 Arthrodesis status: Secondary | ICD-10-CM | POA: Diagnosis not present

## 2022-08-18 NOTE — Telephone Encounter (Signed)
Called patient to schedule Medicare Annual Wellness Visit (AWV). Left message for patient to call back and schedule Medicare Annual Wellness Visit (AWV). and No voicemail available to leave a message.  Last date of AWV: 01/21/21  Please schedule an appointment at any time with [facility] or [facility].  If any questions, please contact me at [phone].  Thank you ,  Rudell Cobb AWV direct phone # [phone]

## 2022-08-25 DIAGNOSIS — M1712 Unilateral primary osteoarthritis, left knee: Secondary | ICD-10-CM | POA: Diagnosis not present

## 2022-08-25 DIAGNOSIS — Z96652 Presence of left artificial knee joint: Secondary | ICD-10-CM | POA: Diagnosis not present

## 2022-08-25 DIAGNOSIS — I110 Hypertensive heart disease with heart failure: Secondary | ICD-10-CM | POA: Diagnosis not present

## 2022-08-25 DIAGNOSIS — I509 Heart failure, unspecified: Secondary | ICD-10-CM | POA: Diagnosis not present

## 2022-08-25 DIAGNOSIS — G8918 Other acute postprocedural pain: Secondary | ICD-10-CM | POA: Diagnosis not present

## 2022-08-25 DIAGNOSIS — M25562 Pain in left knee: Secondary | ICD-10-CM | POA: Diagnosis not present

## 2022-08-28 DIAGNOSIS — E785 Hyperlipidemia, unspecified: Secondary | ICD-10-CM | POA: Diagnosis not present

## 2022-08-28 DIAGNOSIS — R1084 Generalized abdominal pain: Secondary | ICD-10-CM | POA: Diagnosis not present

## 2022-08-28 DIAGNOSIS — I110 Hypertensive heart disease with heart failure: Secondary | ICD-10-CM | POA: Diagnosis not present

## 2022-08-28 DIAGNOSIS — Z96651 Presence of right artificial knee joint: Secondary | ICD-10-CM | POA: Diagnosis not present

## 2022-08-28 DIAGNOSIS — R112 Nausea with vomiting, unspecified: Secondary | ICD-10-CM | POA: Diagnosis not present

## 2022-08-28 DIAGNOSIS — R1030 Lower abdominal pain, unspecified: Secondary | ICD-10-CM | POA: Diagnosis not present

## 2022-08-28 DIAGNOSIS — N898 Other specified noninflammatory disorders of vagina: Secondary | ICD-10-CM | POA: Diagnosis not present

## 2022-08-28 DIAGNOSIS — R110 Nausea: Secondary | ICD-10-CM | POA: Diagnosis not present

## 2022-08-28 DIAGNOSIS — Z87891 Personal history of nicotine dependence: Secondary | ICD-10-CM | POA: Diagnosis not present

## 2022-08-28 DIAGNOSIS — K529 Noninfective gastroenteritis and colitis, unspecified: Secondary | ICD-10-CM | POA: Diagnosis not present

## 2022-08-28 DIAGNOSIS — K6389 Other specified diseases of intestine: Secondary | ICD-10-CM | POA: Diagnosis not present

## 2022-08-28 DIAGNOSIS — K5939 Other megacolon: Secondary | ICD-10-CM | POA: Diagnosis not present

## 2022-08-28 DIAGNOSIS — K5900 Constipation, unspecified: Secondary | ICD-10-CM | POA: Diagnosis not present

## 2022-08-28 DIAGNOSIS — Z8659 Personal history of other mental and behavioral disorders: Secondary | ICD-10-CM | POA: Diagnosis not present

## 2022-08-28 DIAGNOSIS — N281 Cyst of kidney, acquired: Secondary | ICD-10-CM | POA: Diagnosis not present

## 2022-08-28 DIAGNOSIS — Z8709 Personal history of other diseases of the respiratory system: Secondary | ICD-10-CM | POA: Diagnosis not present

## 2022-09-01 DIAGNOSIS — Z96652 Presence of left artificial knee joint: Secondary | ICD-10-CM | POA: Diagnosis not present

## 2022-09-03 DIAGNOSIS — Z96652 Presence of left artificial knee joint: Secondary | ICD-10-CM | POA: Diagnosis not present

## 2022-09-05 DIAGNOSIS — Z96652 Presence of left artificial knee joint: Secondary | ICD-10-CM | POA: Diagnosis not present

## 2022-09-07 DIAGNOSIS — Z96652 Presence of left artificial knee joint: Secondary | ICD-10-CM | POA: Diagnosis not present

## 2022-09-13 ENCOUNTER — Other Ambulatory Visit: Payer: Self-pay | Admitting: Family Medicine

## 2022-09-22 DIAGNOSIS — Z0000 Encounter for general adult medical examination without abnormal findings: Secondary | ICD-10-CM | POA: Diagnosis not present

## 2022-09-22 DIAGNOSIS — I509 Heart failure, unspecified: Secondary | ICD-10-CM | POA: Diagnosis not present

## 2022-09-22 DIAGNOSIS — Z1331 Encounter for screening for depression: Secondary | ICD-10-CM | POA: Diagnosis not present

## 2022-09-22 DIAGNOSIS — E785 Hyperlipidemia, unspecified: Secondary | ICD-10-CM | POA: Diagnosis not present

## 2022-09-22 DIAGNOSIS — J4520 Mild intermittent asthma, uncomplicated: Secondary | ICD-10-CM | POA: Diagnosis not present

## 2022-09-22 DIAGNOSIS — I10 Essential (primary) hypertension: Secondary | ICD-10-CM | POA: Diagnosis not present

## 2022-09-22 DIAGNOSIS — E669 Obesity, unspecified: Secondary | ICD-10-CM | POA: Diagnosis not present

## 2022-09-22 DIAGNOSIS — Z87891 Personal history of nicotine dependence: Secondary | ICD-10-CM | POA: Diagnosis not present

## 2022-09-22 DIAGNOSIS — M1712 Unilateral primary osteoarthritis, left knee: Secondary | ICD-10-CM | POA: Diagnosis not present

## 2022-09-22 DIAGNOSIS — E782 Mixed hyperlipidemia: Secondary | ICD-10-CM | POA: Diagnosis not present

## 2022-09-23 ENCOUNTER — Encounter: Payer: PPO | Admitting: Family Medicine

## 2022-09-23 DIAGNOSIS — M79671 Pain in right foot: Secondary | ICD-10-CM | POA: Diagnosis not present

## 2022-09-23 DIAGNOSIS — M25562 Pain in left knee: Secondary | ICD-10-CM | POA: Diagnosis not present

## 2022-09-23 DIAGNOSIS — Z96651 Presence of right artificial knee joint: Secondary | ICD-10-CM | POA: Diagnosis not present

## 2022-09-23 DIAGNOSIS — M1712 Unilateral primary osteoarthritis, left knee: Secondary | ICD-10-CM | POA: Diagnosis not present

## 2022-09-24 DIAGNOSIS — M1712 Unilateral primary osteoarthritis, left knee: Secondary | ICD-10-CM | POA: Diagnosis not present

## 2022-09-24 DIAGNOSIS — Z96652 Presence of left artificial knee joint: Secondary | ICD-10-CM | POA: Diagnosis not present

## 2022-10-10 ENCOUNTER — Ambulatory Visit (INDEPENDENT_AMBULATORY_CARE_PROVIDER_SITE_OTHER): Payer: PPO | Admitting: Family Medicine

## 2022-10-10 ENCOUNTER — Encounter: Payer: Self-pay | Admitting: Family Medicine

## 2022-10-10 ENCOUNTER — Other Ambulatory Visit: Payer: Self-pay

## 2022-10-10 VITALS — BP 120/76 | HR 72 | Temp 98.2°F | Ht 62.0 in | Wt 172.0 lb

## 2022-10-10 DIAGNOSIS — Z0000 Encounter for general adult medical examination without abnormal findings: Secondary | ICD-10-CM | POA: Diagnosis not present

## 2022-10-10 NOTE — Progress Notes (Signed)
Subjective:    Patient ID: Tina May, female    DOB: May 13, 1945, 77 y.o.   MRN: [id number]  HPI Here for a well exam. She feels fine except for chronic back and joint pains. She spent most of the past winter in [place], and on 03-19-22 she had a lumbar spinal fusion surgery. On 08-28-22 she had a left knee total arthroplasty. These went well, though she still has some pain every day. She will spend the summer here in [place]. She had extensive labs drawn in [place] on 08-28-22, and these were unremarkable. Her Hgb was 12.0, her creatinine was 0.66, and her GFR was 90.5.    Review of Systems  Constitutional: Negative.   HENT: Negative.    Eyes: Negative.   Respiratory: Negative.    Cardiovascular: Negative.   Gastrointestinal: Negative.   Genitourinary:  Negative for decreased urine volume, difficulty urinating, dyspareunia, dysuria, enuresis, flank pain, frequency, hematuria, pelvic pain and urgency.  Musculoskeletal:  Positive for arthralgias and back pain.  Skin: Negative.   Neurological: Negative.  Negative for headaches.  Psychiatric/Behavioral: Negative.         Objective:   Physical Exam Constitutional:      General: She is not in acute distress.    Appearance: Normal appearance. She is well-developed.  HENT:     Head: Normocephalic and atraumatic.     Right Ear: External ear normal.     Left Ear: External ear normal.     Nose: Nose normal.     Mouth/Throat:     Pharynx: No oropharyngeal exudate.  Eyes:     General: No scleral icterus.    Conjunctiva/sclera: Conjunctivae normal.     Pupils: Pupils are equal, round, and reactive to light.  Neck:     Thyroid: No thyromegaly.     Vascular: No JVD.  Cardiovascular:     Rate and Rhythm: Normal rate and regular rhythm.     Pulses: Normal pulses.     Heart sounds: Normal heart sounds. No murmur heard.    No friction rub. No gallop.  Pulmonary:     Effort: Pulmonary effort is normal. No respiratory distress.      Breath sounds: Normal breath sounds. No wheezing or rales.  Chest:     Chest wall: No tenderness.  Abdominal:     General: Bowel sounds are normal. There is no distension.     Palpations: Abdomen is soft. There is no mass.     Tenderness: There is no abdominal tenderness. There is no guarding or rebound.  Musculoskeletal:        General: No tenderness. Normal range of motion.     Cervical back: Normal range of motion and neck supple.  Lymphadenopathy:     Cervical: No cervical adenopathy.  Skin:    General: Skin is warm and dry.     Findings: No erythema or rash.  Neurological:     General: No focal deficit present.     Mental Status: She is alert and oriented to person, place, and time.     Cranial Nerves: No cranial nerve deficit.     Motor: No abnormal muscle tone.     Coordination: Coordination normal.     Deep Tendon Reflexes: Reflexes are normal and symmetric. Reflexes normal.  Psychiatric:        Behavior: Behavior normal.        Thought Content: Thought content normal.        Judgment: Judgment normal.  Assessment & Plan:  Well exam. We discussed diet and exercise. Get fasting labs including lipids, A1c, and TSH.  Gershon Crane, MD

## 2022-10-13 DIAGNOSIS — D0439 Carcinoma in situ of skin of other parts of face: Secondary | ICD-10-CM | POA: Diagnosis not present

## 2022-10-13 DIAGNOSIS — L821 Other seborrheic keratosis: Secondary | ICD-10-CM | POA: Diagnosis not present

## 2022-10-13 DIAGNOSIS — Z85828 Personal history of other malignant neoplasm of skin: Secondary | ICD-10-CM | POA: Diagnosis not present

## 2022-10-13 DIAGNOSIS — L578 Other skin changes due to chronic exposure to nonionizing radiation: Secondary | ICD-10-CM | POA: Diagnosis not present

## 2022-10-13 DIAGNOSIS — L570 Actinic keratosis: Secondary | ICD-10-CM | POA: Diagnosis not present

## 2022-10-13 DIAGNOSIS — C44719 Basal cell carcinoma of skin of left lower limb, including hip: Secondary | ICD-10-CM | POA: Diagnosis not present

## 2022-10-13 DIAGNOSIS — D485 Neoplasm of uncertain behavior of skin: Secondary | ICD-10-CM | POA: Diagnosis not present

## 2022-10-15 ENCOUNTER — Other Ambulatory Visit (INDEPENDENT_AMBULATORY_CARE_PROVIDER_SITE_OTHER): Payer: PPO

## 2022-10-15 DIAGNOSIS — Z79890 Hormone replacement therapy (postmenopausal): Secondary | ICD-10-CM | POA: Diagnosis not present

## 2022-10-15 DIAGNOSIS — Z0000 Encounter for general adult medical examination without abnormal findings: Secondary | ICD-10-CM | POA: Diagnosis not present

## 2022-10-15 DIAGNOSIS — Z1231 Encounter for screening mammogram for malignant neoplasm of breast: Secondary | ICD-10-CM | POA: Diagnosis not present

## 2022-10-15 DIAGNOSIS — Z01419 Encounter for gynecological examination (general) (routine) without abnormal findings: Secondary | ICD-10-CM | POA: Diagnosis not present

## 2022-10-15 DIAGNOSIS — Z13820 Encounter for screening for osteoporosis: Secondary | ICD-10-CM | POA: Diagnosis not present

## 2022-10-15 DIAGNOSIS — Z6833 Body mass index (BMI) 33.0-33.9, adult: Secondary | ICD-10-CM | POA: Diagnosis not present

## 2022-10-15 DIAGNOSIS — Z139 Encounter for screening, unspecified: Secondary | ICD-10-CM | POA: Diagnosis not present

## 2022-10-15 DIAGNOSIS — Z1211 Encounter for screening for malignant neoplasm of colon: Secondary | ICD-10-CM | POA: Diagnosis not present

## 2022-10-15 LAB — LIPID PANEL
Cholesterol: 161 mg/dL (ref 0–200)
HDL: 64.4 mg/dL (ref >39.00)
LDL Cholesterol: 73 mg/dL (ref 0–99)
NonHDL: 96.91
Total CHOL/HDL Ratio: 3
Triglycerides: 119 mg/dL (ref 0.0–149.0)
VLDL: 23.8 mg/dL (ref 0.0–40.0)

## 2022-10-15 LAB — TSH: TSH: 1.02 u[IU]/mL (ref 0.35–5.50)

## 2022-10-15 LAB — HEMOGLOBIN A1C: Hgb A1c MFr Bld: 5.4 % (ref 4.6–6.5)

## 2022-10-21 ENCOUNTER — Other Ambulatory Visit: Payer: Self-pay | Admitting: Obstetrics and Gynecology

## 2022-10-21 DIAGNOSIS — Z13820 Encounter for screening for osteoporosis: Secondary | ICD-10-CM

## 2022-11-01 ENCOUNTER — Other Ambulatory Visit: Payer: Self-pay | Admitting: Family Medicine

## 2022-11-16 ENCOUNTER — Other Ambulatory Visit: Payer: Self-pay | Admitting: Cardiovascular Disease

## 2022-11-17 ENCOUNTER — Telehealth: Payer: Self-pay | Admitting: Family Medicine

## 2022-11-17 DIAGNOSIS — M810 Age-related osteoporosis without current pathological fracture: Secondary | ICD-10-CM

## 2022-11-17 NOTE — Telephone Encounter (Signed)
Pt called to request a brace for her right foot  // ankle brace, stating she has nerve damage in her foot.  Please advise.

## 2022-11-18 NOTE — Telephone Encounter (Signed)
What sort of nerve damage is she taking about? Has she seen anyone for this? I would think that a podiatrist could fit her with the proper brace

## 2022-11-19 NOTE — Telephone Encounter (Signed)
Left a detailed message with the information below at the patient's home number. 

## 2022-11-19 NOTE — Telephone Encounter (Signed)
Done

## 2022-11-21 ENCOUNTER — Ambulatory Visit (INDEPENDENT_AMBULATORY_CARE_PROVIDER_SITE_OTHER)
Admission: RE | Admit: 2022-11-21 | Discharge: 2022-11-21 | Disposition: A | Discharge status: Home / Self Care | Payer: PPO | Source: Ambulatory Visit | Attending: Family Medicine | Admitting: Family Medicine

## 2022-11-21 DIAGNOSIS — M810 Age-related osteoporosis without current pathological fracture: Secondary | ICD-10-CM | POA: Diagnosis not present

## 2022-11-21 NOTE — Telephone Encounter (Signed)
She will need to ask one of the orthopedists that saw her for this. I do not order such equipment

## 2022-11-21 NOTE — Telephone Encounter (Signed)
Spoke with pt requesting order for a Brace to her right foot for stability. Please advise

## 2022-11-21 NOTE — Telephone Encounter (Signed)
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 Pt has had MRI of right foot and would like order to be fax to [facility] phone number is [phone]

## 2022-11-21 NOTE — Telephone Encounter (Signed)
Spoke with pt advised of Dr Fry recommendation, pt verbalized understanding 

## 2022-12-01 ENCOUNTER — Other Ambulatory Visit: Payer: Self-pay | Admitting: Cardiovascular Disease

## 2022-12-03 ENCOUNTER — Encounter (INDEPENDENT_AMBULATORY_CARE_PROVIDER_SITE_OTHER): Payer: Self-pay

## 2022-12-03 DIAGNOSIS — M79671 Pain in right foot: Secondary | ICD-10-CM | POA: Diagnosis not present

## 2022-12-04 ENCOUNTER — Encounter: Payer: Self-pay | Admitting: Cardiovascular Disease

## 2022-12-04 NOTE — Progress Notes (Signed)
Cardiology Office Note:    Date:  12/05/2022   ID:  Tina May, Tina May [birth year], MRN [id number]  PCP:  Nelwyn Salisbury, MD  Cardiologist:  Kristeen Miss, MD    Referring MD: Nelwyn Salisbury, MD   Problem List 1. Chronic systolic CHF  2. Essential HTN 3. Hyperlipidemia 4. COPD  5. Pancreatic lesion:  Chief Complaint  Patient presents with   Congestive Heart Failure        Hyperlipidemia    Tina May is a 77 y.o. female with a hx of chronic systolic CHF in June, 2018.  I saw her in consultation while in the hospital .  EF by echo at that time was 25-30%.Her last week and is unsure and it looks her heart function is improving as we started at the 25-30 range 45 kg  She has been on Entresto 24-26 mg BID Coreg 6.25 BID Lasix 40 mg a day   Feeling much better Cath 01/27/17 showed mild RCA 10% stenosis - otherwise normal coronaries.  Feeling great. Tolerating the Entresto  No shortness of breath   Aug. 30, 2019:  Tina May is seen today for follow-up of her congestive heart failure.  She is done very well.  She is on Entresto 97-103 mg a day.  She is tolerating it quite well. Exercising regularly  - exercise capacity is much better.   Had a great trip to [place] recently  - fished, dog sledded, rode in a helicopter.   November 12, 2018   Continues to travel.   Back to [place] this past year.   car[place] cruise, traveled to [place].   Several months ago ,  Had tachypalps .  Saw Lizabeth Leyden , NP  . Was started on amlodipine ( caused upset stomach, diarrhea )  Has continued to have GI problems.   She thinks it her carvedilol ( process of elimination )  She did a [employer] screening test.  She was found to have very mild right carotid artery disease.  Her ankle-brachial indexes were normal.  Ultrasound of the aorta was normal.  BMI was 32.  She needs to have GYN surgery ,   Needs oopherectomy. [name] No CP , no dypnsea She is at low risk for her upcoming GYN surgery .    February 14, 2019: She is seen today for follow-up of her chronic systolic congestive heart failure.  She has not tolerated carvedilol so we changed to Toprol-XL.  The Toprol caused similar GI upset.  Changed to Bisoprolol  breathing is great.   Is exercising .   Plays golf 3 times a week.   [place]ing [place],  [place], yard work .   Sep 09, 2019: Tina May  is seen today for follow-up visit for her chronic systolic congestive heart failure. Her most recent echocardiogram was December, 2019 which revealed mildly depressed left ventricular systolic function with an ejection fraction 45 to 50%.  She has grade 1 diastolic dysfunction.  She has mild aortic regurgitation and mild mitral regurgitation.  Continues to have back issues - spinal stenosis  No CP or dyspnea  October 11, 2021 Tina May is seen for follow up of her CHF Has mild AI and mild MR  Was last seen by Theodore Demark in 2021  Feeling well, doing well   Aug. 2, 2024 Tina May is seen for follow up of her CHF, has mild AI, mild MR  Had a left knee replacement ( robotic ) Dr. Nathaniel Man [facility] .  She had a pre op echo prior to her knee surgery    No CP , no dyspnea      Past Medical History:  Diagnosis Date   Arthritis    Complex cyst of right ovary    DDD (degenerative disc disease), lumbar    Diverticulosis    External hemorrhoids    History of basal cell carcinoma (BCC) excision    HTN (hypertension)    Hyperlipidemia    Intermittent palpitations    event monitor 10-27-2018 (Dr Elease Hashimoto)  SR including ST and SB with two episodes NSVT   Mild asthma    NICM (nonischemic cardiomyopathy) (HCC)    Dr Elease Hashimoto, dx 06/ 2018  ef 25-30%;   stress echo 12-22-2016 no ischemia, ef 30-35%; last echo 04-22-2018 ef 45-50%; cath 01-27-2017  no sig. cad,10% pRCA to mRCA   Pancreatic lesion 03/11/2013   last CT abd. 10-27-2018, stable   Spinal stenosis of lumbar region    Systolic CHF, chronic (HCC) 10/2016   followed by Dr Elease Hashimoto;   per echo 04-22-2018 ef 45-50%, G1DD    Past Surgical History:  Procedure Laterality Date   COLONOSCOPY  01/25/2014   per Dr. Juanda Chance, diverticulae only, repeat in 10 yrs    HAND TENDON SURGERY Right 2014  approx.   HEMICOLECTOMY  2005   resection colon tumor, per pt benign   LEFT HEART CATH AND CORONARY ANGIOGRAPHY N/A 01/27/2017   Procedure: LEFT HEART CATH AND CORONARY ANGIOGRAPHY;  Surgeon: [place]az[place], Peter M, MD;  Location: [facility] INVASIVE CV LAB;  Service: Cardiovascular;  Laterality: N/A;   ROBOTIC ASSISTED SALPINGO OOPHERECTOMY Bilateral 01/13/2019   Procedure: XI ROBOTIC ASSISTED SALPINGO OOPHORECTOMY;  Surgeon: Silverio Lay, MD;  Location: [facility];  Service: Gynecology;  Laterality: Bilateral;   SPINE SURGERY  04/02/2020   lumbar spine surgery at [facility]    TONSILLECTOMY  age 15   VAGINAL HYSTERECTOMY  1994    Current Medications: Current Meds  Medication Sig   albuterol (VENTOLIN HFA) 108 (90 Base) MCG/ACT inhaler Inhale 2 puffs into the lungs every 6 (six) hours as needed for wheezing or shortness of breath. (Patient taking differently: Inhale 2 puffs into the lungs as needed for wheezing or shortness of breath.)   bisoprolol (ZEBETA) 5 MG tablet TAKE 1 TABLET (5 MG TOTAL) BY MOUTH DAILY. PT NEEDS TO KEEP UPCOMING APPT IN AUG FOR FURTHER REFILLS   ezetimibe (ZETIA) 10 MG tablet TAKE 1 TABLET BY MOUTH EVERY DAY   FIBER SELECT GUMMIES CHEW Chew 2 tablets by mouth daily. Total daily dose 10mg .   Hyaluronic Acid-Vitamin C (HYALURONIC ACID PO) Take 1 tablet by mouth daily. Women's Hyaluronic Acid   meloxicam (MOBIC) 15 MG tablet TAKE 1 TABLET (15 MG TOTAL) BY MOUTH DAILY.   Multiple Vitamin (MULTIVITAMIN WITH MINERALS) TABS tablet Take 1 tablet by mouth daily. One-A-Day   PREMARIN 0.625 MG tablet Take 0.625 mg by mouth once a week.   RESTASIS 0.05 % ophthalmic emulsion Place 1 drop into both eyes 2 (two) times daily.    rosuvastatin (CRESTOR) 5 MG tablet TAKE 1 TABLET (5  MG TOTAL) BY MOUTH DAILY.   sacubitril-valsartan (ENTRESTO) 97-103 MG Take 1 tablet by mouth 2 (two) times daily.   spironolactone (ALDACTONE) 25 MG tablet Take 0.5 tablets (12.5 mg total) by mouth daily. Pt needs to keep upcoming appt in Aug for further refills   TURMERIC CURCUMIN PO Take 1,000 mg by mouth daily.     Allergies:  Amlodipine, Atorvastatin, Diclofenac, and Morphine and codeine   Social History   Socioeconomic History   Marital status: Widowed    Spouse name: Not on file   Number of children: 3   Years of education: Not on file   Highest education level: Not on file  Occupational History   Occupation: Retired  Tobacco Use   Smoking status: Former    Current packs/day: 0.00    Average packs/day: 1 pack/day for 20.0 years (20.0 ttl pk-yrs)    Types: Cigarettes    Start date: 05/06/1971    Quit date: 05/06/1991    Years since quitting: 31.6   Smokeless tobacco: Never  Vaping Use   Vaping status: Never Used  Substance and Sexual Activity   Alcohol use: Yes    Alcohol/week: 8.0 standard drinks of alcohol    Types: 7 Glasses of wine, 1 Standard drinks or equivalent per week   Drug use: Never   Sexual activity: Yes    Birth control/protection: Surgical  Other Topics Concern   Not on file  Social History Narrative   Has homes in [place], [place] (winters there).   Social Determinants of Health   Financial Resource Strain: Low Risk  (01/27/2022)   [facility] from [facility], [facility]   Overall Financial Resource Strain (CARDIA)    Difficulty of Paying Living Expenses: Not hard at all  Food Insecurity: No Food Insecurity (02/20/2022)   Hunger Vital Sign    Worried About Running Out of Food in the Last Year: Never true    Ran Out of Food in the Last Year: Never true  Transportation Needs: No Transportation Needs (02/20/2022)   PRAPARE - Administrator, Civil Service (Medical): No    Lack of  Transportation (Non-Medical): No  Physical Activity: Sufficiently Active (01/21/2021)   Exercise Vital Sign    Days of Exercise per Week: 4 days    Minutes of Exercise per Session: 60 min  Stress: No Stress Concern Present (01/21/2021)   [employer] of Occupational Health - Occupational Stress Questionnaire    Feeling of Stress : Not at all  Social Connections: Moderately Integrated (01/21/2021)   Social Connection and Isolation Panel [NHANES]    Frequency of Communication with Friends and Family: Three times a week    Frequency of Social Gatherings with Friends and Family: Three times a week    Attends Religious Services: 1 to 4 times per year    Active Member of Clubs or Organizations: Yes    Attends Banker Meetings: More than 4 times per year    Marital Status: Widowed     Family History: The patient's family history includes Healthy in her brother and sister; Hyperlipidemia in an other family member; Hypertension in an other family member; Prostate cancer in an other family member. There is no history of Colon cancer, Esophageal cancer, Inflammatory bowel disease, Liver disease, Pancreatic cancer, Rectal cancer, or Stomach cancer. ROS:   Please see the history of present illness.     All other systems reviewed and are negative.  EKGs/Labs/Other Studies Reviewed:    The following studies were reviewed today:     Recent Labs: 01/26/2022: ALT 17; BUN 21; Creatinine, Ser 0.77; Hemoglobin 10.7; Platelets 302; Potassium 3.5; Sodium 138 10/15/2022: TSH 1.02  Recent Lipid Panel    Component Value Date/Time   CHOL 161 10/15/2022 0743   CHOL 165 03/04/2021 0834   TRIG 119.0 10/15/2022  0743   HDL 64.40 10/15/2022 0743   HDL 66 03/04/2021 0834   CHOLHDL 3 10/15/2022 0743   VLDL 23.8 10/15/2022 0743   LDLCALC 73 10/15/2022 0743   LDLCALC 80 03/04/2021 0834   LDLCALC 122 (H) 04/11/2020 0856   LDLDIRECT 113.0 09/11/2017 0814    Physical Exam: Blood pressure  120/70, pulse 70, height 5\' 2"  (1.575 m), weight 174 lb (78.9 kg), SpO2 97%.       GEN:  Well nourished, well developed in no acute distress HEENT: Normal NECK: No JVD; No carotid bruits LYMPHATICS: No lymphadenopathy CARDIAC: RRR , very soft systolic murmur  RESPIRATORY:  Clear to auscultation without rales, wheezing or rhonchi  ABDOMEN: Soft, non-tender, non-distended MUSCULOSKELETAL:  No edema; No deformity  SKIN: Warm and dry NEUROLOGIC:  Alert and oriented x 3   EKG:   EKG Interpretation Date/Time:  Friday December 05 2022 09:27:28 EDT Ventricular Rate:  70 PR Interval:  198 QRS Duration:  102 QT Interval:  420 QTC Calculation: 453 R Axis:   -44  Text Interpretation: Normal sinus rhythm Left axis deviation Minimal voltage criteria for LVH, may be normal variant ( Cornell product ) poor R wave progressin ,  likely due to lead placement When compared with ECG of 26-Oct-2016 09:37, PREVIOUS ECG IS PRESENT Confirmed by Kristeen Miss ([id number]) on 12/05/2022 10:09:38 AM      ASSESSMENT:    1. Chronic combined systolic and diastolic heart failure (HCC)   2. Nonrheumatic mitral valve regurgitation      PLAN:      1.  Chronic Systolic CHF:    doing great.   Cont current meds.  BMP today   Dong well     2. Hyperlipidemia:    LDL is 73    3.  Mitral regurgitation     Medication Adjustments/Labs and Tests Ordered: Current medicines are reviewed at length with the patient today.  Concerns regarding medicines are outlined above.  Orders Placed This Encounter  Procedures   Basic metabolic panel   EKG 12-Lead   No orders of the defined types were placed in this encounter.   Signed, Kristeen Miss, MD  12/05/2022 10:09 AM    [facility]

## 2022-12-05 ENCOUNTER — Encounter: Payer: Self-pay | Admitting: Cardiovascular Disease

## 2022-12-05 ENCOUNTER — Ambulatory Visit: Payer: PPO | Admitting: Cardiovascular Disease

## 2022-12-05 VITALS — BP 120/70 | HR 70 | Ht 62.0 in | Wt 174.0 lb

## 2022-12-05 DIAGNOSIS — I5042 Chronic combined systolic (congestive) and diastolic (congestive) heart failure: Secondary | ICD-10-CM | POA: Diagnosis not present

## 2022-12-05 DIAGNOSIS — I340 Nonrheumatic mitral (valve) insufficiency: Secondary | ICD-10-CM

## 2022-12-05 NOTE — Patient Instructions (Signed)
Medication Instructions:  Your physician recommends that you continue on your current medications as directed. Please refer to the Current Medication list given to you today.  *If you need a refill on your cardiac medications before your next appointment, please call your pharmacy*   Lab Work: BMET today If you have labs (blood work) drawn today and your tests are completely normal, you will receive your results only by: [place] (if you have MyChart) OR A paper copy in the mail If you have any lab test that is abnormal or we need to change your treatment, we will call you to review the results.   Testing/Procedures: NONE   Follow-Up: At [facility], you and your health needs are our priority.  As part of our continuing mission to provide you with exceptional heart care, we have created designated Provider Care Teams.  These Care Teams include your primary Cardiologist (physician) and Advanced Practice Providers (APPs -  Physician Assistants and Nurse Practitioners) who all work together to provide you with the care you need, when you need it.  We recommend signing up for the patient portal called "MyChart".  Sign up information is provided on this After Visit Summary.  MyChart is used to connect with patients for Virtual Visits (Telemedicine).  Patients are able to view lab/test results, encounter notes, upcoming appointments, etc.  Non-urgent messages can be sent to your provider as well.   To learn more about what you can do with MyChart, go to [email or web address].    Your next appointment:   1 year(s)  Provider:   Mertie Moores, MD

## 2022-12-15 ENCOUNTER — Telehealth: Payer: Self-pay | Admitting: Cardiovascular Disease

## 2022-12-15 NOTE — Telephone Encounter (Signed)
° °  Pt is returning call to get lab result °

## 2022-12-15 NOTE — Telephone Encounter (Signed)
Lars Mage, RN 12/11/2022  2:53 PM EDT Back to Top    Sodium and Potassium have now resulted and are stable. Left message of results (DPR on file) and to call back if any questions or if pt would like to discuss further.   Vesta Mixer, MD 12/08/2022  5:21 PM EDT     Sodium and potassium were not reported The rest of the BMP is stable   Returned call to patient and relayed above results. No further questions.

## 2022-12-22 DIAGNOSIS — C44719 Basal cell carcinoma of skin of left lower limb, including hip: Secondary | ICD-10-CM | POA: Diagnosis not present

## 2022-12-22 DIAGNOSIS — C44321 Squamous cell carcinoma of skin of nose: Secondary | ICD-10-CM | POA: Diagnosis not present

## 2022-12-25 ENCOUNTER — Other Ambulatory Visit: Payer: Self-pay | Admitting: Cardiovascular Disease

## 2022-12-26 ENCOUNTER — Other Ambulatory Visit: Payer: Self-pay | Admitting: Cardiovascular Disease

## 2022-12-29 ENCOUNTER — Ambulatory Visit (INDEPENDENT_AMBULATORY_CARE_PROVIDER_SITE_OTHER): Payer: PPO

## 2022-12-29 VITALS — Ht 62.0 in | Wt 174.0 lb

## 2022-12-29 DIAGNOSIS — Z0000 Encounter for general adult medical examination without abnormal findings: Secondary | ICD-10-CM

## 2022-12-29 DIAGNOSIS — Z23 Encounter for immunization: Secondary | ICD-10-CM

## 2022-12-29 DIAGNOSIS — Z4802 Encounter for removal of sutures: Secondary | ICD-10-CM | POA: Diagnosis not present

## 2022-12-29 NOTE — Progress Notes (Signed)
Subjective:   Tina May is a 77 y.o. female who presents for Medicare Annual (Subsequent) preventive examination.  Visit Complete: Virtual  I connected with  Bethann Humble on 12/29/22 by a audio enabled telemedicine application and verified that I am speaking with the correct person using two identifiers.  Patient Location: Home  Provider Location: Home Office  I discussed the limitations of evaluation and management by telemedicine. The patient expressed understanding and agreed to proceed.    Review of Systems    Vital Signs: Unable to obtain new vitals due to this being a telehealth visit.  Cardiac Risk Factors include: advanced age (>64men, >76 women);hypertension     Objective:    Today's Vitals   12/29/22 0822  Weight: 174 lb (78.9 kg)  Height: 5\' 2"  (1.575 m)   Body mass index is 31.83 kg/m.     12/29/2022    8:29 AM 01/26/2022    7:32 PM 09/13/2021    9:11 AM 01/21/2021   10:57 AM 01/13/2019    6:58 AM 11/26/2018    9:10 AM 10/27/2018    1:15 PM  Advanced Directives  Does Patient Have a Medical Advance Directive? Yes No No Yes Yes Yes Yes  Type of Estate agent of [place];Living will   Healthcare Power of [place];Living will Living will Healthcare Power of [place];Living will Healthcare Power of [place];Living will  Does patient want to make changes to medical advance directive?     No - Patient declined No - Patient declined   Copy of Healthcare Power of Attorney in Chart? No - copy requested   No - copy requested     Would patient like information on creating a medical advance directive?  No - Patient declined No - Patient declined        Current Medications (verified) Outpatient Encounter Medications as of 12/29/2022  Medication Sig   albuterol (VENTOLIN HFA) 108 (90 Base) MCG/ACT inhaler Inhale 2 puffs into the lungs every 6 (six) hours as needed for wheezing or shortness of breath. (Patient taking differently: Inhale 2 puffs  into the lungs as needed for wheezing or shortness of breath.)   bisoprolol (ZEBETA) 5 MG tablet TAKE 1 TABLET (5 MG TOTAL) BY MOUTH DAILY. PT NEEDS TO KEEP UPCOMING APPT IN AUG FOR FURTHER REFILLS   ENTRESTO 97-103 MG TAKE 1 TABLET BY MOUTH TWICE A DAY   ezetimibe (ZETIA) 10 MG tablet TAKE 1 TABLET BY MOUTH EVERY DAY   FIBER SELECT GUMMIES CHEW Chew 2 tablets by mouth daily. Total daily dose 10mg .   Hyaluronic Acid-Vitamin C (HYALURONIC ACID PO) Take 1 tablet by mouth daily. Women's Hyaluronic Acid   meloxicam (MOBIC) 15 MG tablet TAKE 1 TABLET (15 MG TOTAL) BY MOUTH DAILY.   Multiple Vitamin (MULTIVITAMIN WITH MINERALS) TABS tablet Take 1 tablet by mouth daily. One-A-Day   PREMARIN 0.625 MG tablet Take 0.625 mg by mouth once a week.   RESTASIS 0.05 % ophthalmic emulsion Place 1 drop into both eyes 2 (two) times daily.    rosuvastatin (CRESTOR) 5 MG tablet TAKE 1 TABLET (5 MG TOTAL) BY MOUTH DAILY.   spironolactone (ALDACTONE) 25 MG tablet TAKE 0.5 TABLETS (12.5 MG TOTAL) BY MOUTH DAILY. PT NEEDS TO KEEP UPCOMING APPT IN AUG FOR FURTHER REFILLS   TURMERIC CURCUMIN PO Take 1,000 mg by mouth daily.   No facility-administered encounter medications on file as of 12/29/2022.    Allergies (verified) Amlodipine, Atorvastatin, Diclofenac, and Morphine and codeine  History: Past Medical History:  Diagnosis Date   Arthritis    Complex cyst of right ovary    DDD (degenerative disc disease), lumbar    Diverticulosis    External hemorrhoids    History of basal cell carcinoma (BCC) excision    HTN (hypertension)    Hyperlipidemia    Intermittent palpitations    event monitor 10-27-2018 (Dr Elease Hashimoto)  SR including ST and SB with two episodes NSVT   Mild asthma    NICM (nonischemic cardiomyopathy) (HCC)    Dr Elease Hashimoto, dx 06/ 2018  ef 25-30%;   stress echo 12-22-2016 no ischemia, ef 30-35%; last echo 04-22-2018 ef 45-50%; cath 01-27-2017  no sig. cad,10% pRCA to mRCA   Pancreatic lesion 03/11/2013    last CT abd. 10-27-2018, stable   Spinal stenosis of lumbar region    Systolic CHF, chronic (HCC) 10/2016   followed by Dr Elease Hashimoto;  per echo 04-22-2018 ef 45-50%, G1DD   Past Surgical History:  Procedure Laterality Date   COLONOSCOPY  01/25/2014   per Dr. Juanda Chance, diverticulae only, repeat in 10 yrs    HAND TENDON SURGERY Right 2014  approx.   HEMICOLECTOMY  2005   resection colon tumor, per pt benign   LEFT HEART CATH AND CORONARY ANGIOGRAPHY N/A 01/27/2017   Procedure: LEFT HEART CATH AND CORONARY ANGIOGRAPHY;  Surgeon: [place], Peter M, MD;  Location: [facility] INVASIVE CV LAB;  Service: Cardiovascular;  Laterality: N/A;   ROBOTIC ASSISTED SALPINGO OOPHERECTOMY Bilateral 01/13/2019   Procedure: XI ROBOTIC ASSISTED SALPINGO OOPHORECTOMY;  Surgeon: Silverio Lay, MD;  Location:  SURGERY CENTER;  Service: Gynecology;  Laterality: Bilateral;   SPINE SURGERY  04/02/2020   lumbar spine surgery at [facility]    TONSILLECTOMY  age 76   VAGINAL HYSTERECTOMY  1994   Family History  Problem Relation Age of Onset   Hyperlipidemia Other    Hypertension Other    Prostate cancer Other    Healthy Sister    Healthy Brother    Colon cancer Neg Hx    Esophageal cancer Neg Hx    Inflammatory bowel disease Neg Hx    Liver disease Neg Hx    Pancreatic cancer Neg Hx    Rectal cancer Neg Hx    Stomach cancer Neg Hx    Social History   Socioeconomic History   Marital status: Widowed    Spouse name: Not on file   Number of children: 3   Years of education: Not on file   Highest education level: Not on file  Occupational History   Occupation: Retired  Tobacco Use   Smoking status: Former    Current packs/day: 0.00    Average packs/day: 1 pack/day for 20.0 years (20.0 ttl pk-yrs)    Types: Cigarettes    Start date: 05/06/1971    Quit date: 05/06/1991    Years since quitting: 31.6   Smokeless tobacco: Never  Vaping Use   Vaping status: Never Used  Substance and Sexual Activity   Alcohol use:  Yes    Alcohol/week: 8.0 standard drinks of alcohol    Types: 7 Glasses of wine, 1 Standard drinks or equivalent per week   Drug use: Never   Sexual activity: Yes    Birth control/protection: Surgical  Other Topics Concern   Not on file  Social History Narrative   Has homes in [place] and [place], [place] (winters there).   Social Determinants of Health   Financial Resource Strain: Low Risk  (12/29/2022)  Overall Financial Resource Strain (CARDIA)    Difficulty of Paying Living Expenses: Not hard at all  Food Insecurity: No Food Insecurity (12/29/2022)   Hunger Vital Sign    Worried About Running Out of Food in the Last Year: Never true    Ran Out of Food in the Last Year: Never true  Transportation Needs: No Transportation Needs (12/29/2022)   PRAPARE - Administrator, Civil Service (Medical): No    Lack of Transportation (Non-Medical): No  Physical Activity: Sufficiently Active (12/29/2022)   Exercise Vital Sign    Days of Exercise per Week: 6 days    Minutes of Exercise per Session: 150+ min  Stress: No Stress Concern Present (12/29/2022)   [employer] of Occupational Health - Occupational Stress Questionnaire    Feeling of Stress : Not at all  Social Connections: Moderately Integrated (12/29/2022)   Social Connection and Isolation Panel [NHANES]    Frequency of Communication with Friends and Family: More than three times a week    Frequency of Social Gatherings with Friends and Family: More than three times a week    Attends Religious Services: More than 4 times per year    Active Member of [employer] or Organizations: Yes    Attends Banker Meetings: More than 4 times per year    Marital Status: Widowed    Tobacco Counseling Counseling given: Not Answered   Clinical Intake:  Pre-visit preparation completed: Yes  Pain : No/denies pain     BMI - recorded: 31.83 Nutritional Status: BMI > 30  Obese Nutritional Risks: None Diabetes:  No  How often do you need to have someone help you when you read instructions, pamphlets, or other written materials from your doctor or pharmacy?: 1 - Never  Interpreter Needed?: No  Information entered by :: Nicholes Calamity LPN   Activities of Daily Living    12/29/2022    8:28 AM  In your present state of health, do you have any difficulty performing the following activities:  Hearing? 0  Vision? 0  Difficulty concentrating or making decisions? 0  Walking or climbing stairs? 1  Comment Uses a cane  Dressing or bathing? 0  Doing errands, shopping? 0  Preparing Food and eating ? N  Using the Toilet? N  In the past six months, have you accidently leaked urine? N  Do you have problems with loss of bowel control? N  Managing your Medications? N  Managing your Finances? N  Housekeeping or managing your Housekeeping? N    Patient Care Team: Nelwyn Salisbury, MD as PCP - General Nahser, Deloris Ping, MD as PCP - Cardiology (Cardiology) Verner Chol, [facility] (Inactive) as Pharmacist (Pharmacist)  Indicate any recent Medical Services you may have received from other than [facility] providers in the past year (date may be approximate).     Assessment:   This is a routine wellness examination for [place].  Hearing/Vision screen Hearing Screening - Comments:: Denies hearing difficulties    Dietary issues and exercise activities discussed:     Goals Addressed   None    Depression Screen    12/29/2022    8:27 AM 10/10/2022   10:23 AM 01/27/2022    4:37 PM 09/09/2021    9:36 AM 01/21/2021   10:59 AM 01/21/2021   10:55 AM 04/11/2020    8:25 AM  PHQ 2/9 Scores  PHQ - 2 Score 0 0 0 0 0 0 0  PHQ- 9 Score  0  6 0       Fall Risk    12/29/2022    8:29 AM 10/10/2022   10:23 AM 01/27/2022    4:36 PM 09/09/2021    9:36 AM 01/21/2021   10:58 AM  Fall Risk   Falls in the past year? 1 0 0 0 0  Number falls in past yr: 0 0 0 0 0  Injury with Fall? 0 0 0 0 0  Risk for fall due to : No Fall Risks  No Fall Risks No Fall Risks No Fall Risks No Fall Risks  Follow up Falls prevention discussed Falls evaluation completed Falls evaluation completed Falls evaluation completed Falls evaluation completed    MEDICARE RISK AT HOME: Medicare Risk at Home Any stairs in or around the home?: Yes If so, are there any without handrails?: No Home free of loose throw rugs in walkways, pet beds, electrical cords, etc?: Yes Adequate lighting in your home to reduce risk of falls?: Yes Life alert?: No Use of a cane, walker or w/c?: Yes Grab bars in the bathroom?: No Shower chair or bench in shower?: No Elevated toilet seat or a handicapped toilet?: No  TIMED UP AND GO:  Was the test performed?  No    Cognitive Function:        12/29/2022    8:29 AM  6CIT Screen  What Year? 0 points  What month? 0 points  What time? 0 points  Count back from 20 0 points  Months in reverse 0 points  Repeat phrase 0 points  Total Score 0 points    Immunizations Immunization History  Administered Date(s) Administered   Influenza Inj Mdck Quad Pf 03/02/2017, 04/19/2018   Influenza Split 03/04/2011, 03/12/2011   Influenza Whole 02/11/2010   Influenza,inj,Quad PF,6+ Mos 05/04/2015, 03/02/2017, 01/28/2019, 04/11/2020, 02/12/2021   Influenza,inj,quad, With Preservative 03/05/2016   Influenza-Unspecified 01/08/2016   Moderna SARS-COV2 Booster Vaccination 03/21/2020   Moderna Sars-Covid-2 Vaccination 07/11/2019, 08/03/2019   Tdap 02/19/2018    TDAP status: Up to date  Flu Vaccine status: Due, Education has been provided regarding the importance of this vaccine. Advised may receive this vaccine at local pharmacy or Health Dept. Aware to provide a copy of the vaccination record if obtained from local pharmacy or Health Dept. Verbalized acceptance and understanding.  Pneumococcal vaccine status: Declined,  Education has been provided regarding the importance of this vaccine but patient still declined. Advised  may receive this vaccine at local pharmacy or Health Dept. Aware to provide a copy of the vaccination record if obtained from local pharmacy or Health Dept. Verbalized acceptance and understanding.   Covid-19 vaccine status: Declined, Education has been provided regarding the importance of this vaccine but patient still declined. Advised may receive this vaccine at local pharmacy or Health Dept.or vaccine clinic. Aware to provide a copy of the vaccination record if obtained from local pharmacy or Health Dept. Verbalized acceptance and understanding.  Qualifies for Shingles Vaccine? Yes   Zostavax completed No   Shingrix Completed?: No.    Education has been provided regarding the importance of this vaccine. Patient has been advised to call insurance company to determine out of pocket expense if they have not yet received this vaccine. Advised may also receive vaccine at local pharmacy or Health Dept. Verbalized acceptance and understanding.  Screening Tests Health Maintenance  Topic Date Due   Hepatitis C Screening  Never done   COVID-19 Vaccine (3 - Moderna risk series) 04/18/2020   INFLUENZA VACCINE  12/04/2022   Zoster Vaccines- Shingrix (1 of 2) 01/10/2023 (Originally 08/18/1964)   Pneumonia Vaccine 59+ Years old (1 of 1 - PCV) 10/10/2023 (Originally 08/19/2010)   Medicare Annual Wellness (AWV)  12/29/2023   DTaP/Tdap/Td (2 - Td or Tdap) 02/20/2028   DEXA SCAN  Completed   HPV VACCINES  Aged Out   Colonoscopy  Discontinued    Health Maintenance  Health Maintenance Due  Topic Date Due   Hepatitis C Screening  Never done   COVID-19 Vaccine (3 - Moderna risk series) 04/18/2020   INFLUENZA VACCINE  12/04/2022        Bone Density status: Completed 11/21/22. Results reflect: Bone density results: OSTEOPOROSIS. Repeat every   years.  Lung Cancer Screening: (Low Dose CT Chest recommended if Age 70-80 years, 20 pack-year currently smoking OR have quit w/in 15years.) does not qualify.      Additional Screening:  Hepatitis C Screening: does qualify;  Deferred  Vision Screening: Recommended annual ophthalmology exams for early detection of glaucoma and other disorders of the eye. Is the patient up to date with their annual eye exam?  Yes  Who is the provider or what is the name of the office in which the patient attends annual eye exams? Dr Lorin Picket If pt is not established with a provider, would they like to be referred to a provider to establish care? No .   Dental Screening: Recommended annual dental exams for proper oral hygiene    Community Resource Referral / Chronic Care Management:  CRR required this visit?  No   CCM required this visit?  No     Plan:     I have personally reviewed and noted the following in the patient's chart:   Medical and social history Use of alcohol, tobacco or illicit drugs  Current medications and supplements including opioid prescriptions. Patient is not currently taking opioid prescriptions. Functional ability and status Nutritional status Physical activity Advanced directives List of other physicians Hospitalizations, surgeries, and ER visits in previous 12 months Vitals Screenings to include cognitive, depression, and falls Referrals and appointments  In addition, I have reviewed and discussed with patient certain preventive protocols, quality metrics, and best practice recommendations. A written personalized care plan for preventive services as well as general preventive health recommendations were provided to patient.     Tillie Rung, LPN   1/61/0960   After Visit Summary: (MyChart) Due to this being a telephonic visit, the after visit summary with patients personalized plan was offered to patient via MyChart   Nurse Notes: None

## 2022-12-29 NOTE — Patient Instructions (Addendum)
Ms. Tina May , Thank you for taking time to come for your Medicare Wellness Visit. I appreciate your ongoing commitment to your health goals. Please review the following plan we discussed and let me know if I can assist you in the future.   Referrals/Orders/Follow-Ups/Clinician Recommendations:   This is a list of the screening recommended for you and due dates:  Health Maintenance  Topic Date Due   Hepatitis C Screening  Never done   COVID-19 Vaccine (3 - Moderna risk series) 04/18/2020   Flu Shot  12/04/2022   Zoster (Shingles) Vaccine (1 of 2) 01/10/2023*   Pneumonia Vaccine (1 of 1 - PCV) 10/10/2023*   Medicare Annual Wellness Visit  12/29/2023   DTaP/Tdap/Td vaccine (2 - Td or Tdap) 02/20/2028   DEXA scan (bone density measurement)  Completed   HPV Vaccine  Aged Out   Colon Cancer Screening  Discontinued  *Topic was postponed. The date shown is not the original due date.    Advanced directives: (Copy Requested) Please bring a copy of your health care power of attorney and living will to the office to be added to your chart at your convenience.  Next Medicare Annual Wellness Visit scheduled for next year: Yes

## 2023-01-01 DIAGNOSIS — J029 Acute pharyngitis, unspecified: Secondary | ICD-10-CM | POA: Diagnosis not present

## 2023-01-09 ENCOUNTER — Other Ambulatory Visit: Payer: Self-pay | Admitting: Cardiovascular Disease

## 2023-01-31 ENCOUNTER — Other Ambulatory Visit: Payer: Self-pay | Admitting: Family Medicine

## 2023-02-07 DIAGNOSIS — R102 Pelvic and perineal pain: Secondary | ICD-10-CM | POA: Diagnosis not present

## 2023-02-07 DIAGNOSIS — M545 Low back pain, unspecified: Secondary | ICD-10-CM | POA: Diagnosis not present

## 2023-02-07 DIAGNOSIS — I10 Essential (primary) hypertension: Secondary | ICD-10-CM | POA: Diagnosis not present

## 2023-02-07 DIAGNOSIS — M25552 Pain in left hip: Secondary | ICD-10-CM | POA: Diagnosis not present

## 2023-02-07 DIAGNOSIS — M25551 Pain in right hip: Secondary | ICD-10-CM | POA: Diagnosis not present

## 2023-02-07 DIAGNOSIS — I110 Hypertensive heart disease with heart failure: Secondary | ICD-10-CM | POA: Diagnosis not present

## 2023-02-07 DIAGNOSIS — Z87891 Personal history of nicotine dependence: Secondary | ICD-10-CM | POA: Diagnosis not present

## 2023-02-25 DIAGNOSIS — R2689 Other abnormalities of gait and mobility: Secondary | ICD-10-CM | POA: Diagnosis not present

## 2023-02-27 ENCOUNTER — Other Ambulatory Visit: Payer: Self-pay | Admitting: Cardiovascular Disease

## 2023-03-02 DIAGNOSIS — L578 Other skin changes due to chronic exposure to nonionizing radiation: Secondary | ICD-10-CM | POA: Diagnosis not present

## 2023-03-02 DIAGNOSIS — Z85828 Personal history of other malignant neoplasm of skin: Secondary | ICD-10-CM | POA: Diagnosis not present

## 2023-03-02 DIAGNOSIS — L570 Actinic keratosis: Secondary | ICD-10-CM | POA: Diagnosis not present

## 2023-03-02 DIAGNOSIS — D229 Melanocytic nevi, unspecified: Secondary | ICD-10-CM | POA: Diagnosis not present

## 2023-03-02 DIAGNOSIS — L820 Inflamed seborrheic keratosis: Secondary | ICD-10-CM | POA: Diagnosis not present

## 2023-03-02 DIAGNOSIS — D485 Neoplasm of uncertain behavior of skin: Secondary | ICD-10-CM | POA: Diagnosis not present

## 2023-03-27 DIAGNOSIS — M25562 Pain in left knee: Secondary | ICD-10-CM | POA: Diagnosis not present

## 2023-03-27 DIAGNOSIS — M1712 Unilateral primary osteoarthritis, left knee: Secondary | ICD-10-CM | POA: Diagnosis not present

## 2023-03-27 DIAGNOSIS — Z96651 Presence of right artificial knee joint: Secondary | ICD-10-CM | POA: Diagnosis not present

## 2023-04-15 DIAGNOSIS — Z87891 Personal history of nicotine dependence: Secondary | ICD-10-CM | POA: Diagnosis not present

## 2023-04-15 DIAGNOSIS — J4520 Mild intermittent asthma, uncomplicated: Secondary | ICD-10-CM | POA: Diagnosis not present

## 2023-04-15 DIAGNOSIS — M159 Polyosteoarthritis, unspecified: Secondary | ICD-10-CM | POA: Diagnosis not present

## 2023-04-15 DIAGNOSIS — E785 Hyperlipidemia, unspecified: Secondary | ICD-10-CM | POA: Diagnosis not present

## 2023-04-15 DIAGNOSIS — E669 Obesity, unspecified: Secondary | ICD-10-CM | POA: Diagnosis not present

## 2023-04-15 DIAGNOSIS — I5022 Chronic systolic (congestive) heart failure: Secondary | ICD-10-CM | POA: Diagnosis not present

## 2023-04-15 DIAGNOSIS — Z6833 Body mass index (BMI) 33.0-33.9, adult: Secondary | ICD-10-CM | POA: Diagnosis not present

## 2023-04-15 DIAGNOSIS — I10 Essential (primary) hypertension: Secondary | ICD-10-CM | POA: Diagnosis not present

## 2023-05-06 ENCOUNTER — Other Ambulatory Visit: Payer: Self-pay | Admitting: Family Medicine

## 2023-06-21 ENCOUNTER — Other Ambulatory Visit: Payer: Self-pay | Admitting: Cardiovascular Disease

## 2023-07-18 ENCOUNTER — Other Ambulatory Visit: Payer: Self-pay | Admitting: Family Medicine

## 2023-08-11 ENCOUNTER — Other Ambulatory Visit: Payer: Self-pay | Admitting: Obstetrics and Gynecology

## 2023-08-11 DIAGNOSIS — Z1231 Encounter for screening mammogram for malignant neoplasm of breast: Secondary | ICD-10-CM

## 2023-09-24 ENCOUNTER — Other Ambulatory Visit: Payer: Self-pay | Admitting: Family Medicine

## 2023-09-24 MED ORDER — ALBUTEROL SULFATE HFA 108 (90 BASE) MCG/ACT IN AERS: 2.0000 | INHALATION_SPRAY | Freq: Four times a day (QID) | RESPIRATORY_TRACT | 0 refills | Status: DC | PRN

## 2023-09-24 NOTE — Telephone Encounter (Signed)
 Copied from CRM [id number]. Topic: Clinical - Medication Refill >> Sep 24, 2023  8:57 AM [name] wrote: Medication: albuterol  (VENTOLIN  HFA) 108 (90 Base) MCG/ACT inhaler  Has the patient contacted their pharmacy? Yes, patients pharmacy instructed her to contact providers clinic to initiate Rx refill request (Agent: If no, request that the patient contact the pharmacy for the refill. If patient does not wish to contact the pharmacy document the reason why and proceed with request.) (Agent: If yes, when and what did the pharmacy advise?)  This is the patient's preferred pharmacy:  [id number] - [place], [place] - [address] [place] [address] Phone: [phone] Fax: [phone]  Is this the correct pharmacy for this prescription? Yes If no, delete pharmacy and type the correct one.   Has the prescription been filled recently? No  Is the patient out of the medication? Yes  Has the patient been seen for an appointment in the last year OR does the patient have an upcoming appointment? Yes  Can we respond through MyChart? Yes  Agent: Please be advised that Rx refills may take up to 3 business days. We ask that you follow-up with your pharmacy.

## 2023-10-01 DIAGNOSIS — L570 Actinic keratosis: Secondary | ICD-10-CM | POA: Diagnosis not present

## 2023-10-01 DIAGNOSIS — L309 Dermatitis, unspecified: Secondary | ICD-10-CM | POA: Diagnosis not present

## 2023-10-01 DIAGNOSIS — L821 Other seborrheic keratosis: Secondary | ICD-10-CM | POA: Diagnosis not present

## 2023-10-17 ENCOUNTER — Other Ambulatory Visit: Payer: Self-pay | Admitting: Family Medicine

## 2023-10-19 DIAGNOSIS — Z13820 Encounter for screening for osteoporosis: Secondary | ICD-10-CM | POA: Diagnosis not present

## 2023-10-19 DIAGNOSIS — N393 Stress incontinence (female) (male): Secondary | ICD-10-CM | POA: Diagnosis not present

## 2023-10-19 DIAGNOSIS — Z79890 Hormone replacement therapy (postmenopausal): Secondary | ICD-10-CM | POA: Diagnosis not present

## 2023-10-19 DIAGNOSIS — Z1231 Encounter for screening mammogram for malignant neoplasm of breast: Secondary | ICD-10-CM | POA: Diagnosis not present

## 2023-10-19 DIAGNOSIS — Z6833 Body mass index (BMI) 33.0-33.9, adult: Secondary | ICD-10-CM | POA: Diagnosis not present

## 2023-10-19 DIAGNOSIS — Z1211 Encounter for screening for malignant neoplasm of colon: Secondary | ICD-10-CM | POA: Diagnosis not present

## 2023-10-19 DIAGNOSIS — Z01419 Encounter for gynecological examination (general) (routine) without abnormal findings: Secondary | ICD-10-CM | POA: Diagnosis not present

## 2023-10-26 DIAGNOSIS — Z1211 Encounter for screening for malignant neoplasm of colon: Secondary | ICD-10-CM | POA: Diagnosis not present

## 2023-11-01 LAB — COLOGUARD: COLOGUARD: NEGATIVE

## 2023-11-11 ENCOUNTER — Ambulatory Visit
Admission: RE | Admit: 2023-11-11 | Discharge: 2023-11-11 | Disposition: A | Discharge status: Home / Self Care | Source: Ambulatory Visit | Attending: Obstetrics and Gynecology | Admitting: Obstetrics and Gynecology

## 2023-11-11 DIAGNOSIS — Z1231 Encounter for screening mammogram for malignant neoplasm of breast: Secondary | ICD-10-CM

## 2023-11-16 ENCOUNTER — Other Ambulatory Visit: Payer: Self-pay | Admitting: Obstetrics and Gynecology

## 2023-11-16 DIAGNOSIS — R921 Mammographic calcification found on diagnostic imaging of breast: Secondary | ICD-10-CM

## 2023-12-02 DIAGNOSIS — H109 Unspecified conjunctivitis: Secondary | ICD-10-CM | POA: Diagnosis not present

## 2023-12-02 DIAGNOSIS — L299 Pruritus, unspecified: Secondary | ICD-10-CM | POA: Diagnosis not present

## 2023-12-04 ENCOUNTER — Ambulatory Visit
Admission: RE | Admit: 2023-12-04 | Discharge: 2023-12-04 | Disposition: A | Discharge status: Home / Self Care | Source: Ambulatory Visit | Attending: Obstetrics and Gynecology | Admitting: Obstetrics and Gynecology

## 2023-12-04 ENCOUNTER — Ambulatory Visit: Payer: Self-pay

## 2023-12-04 DIAGNOSIS — R921 Mammographic calcification found on diagnostic imaging of breast: Secondary | ICD-10-CM

## 2023-12-04 DIAGNOSIS — R928 Other abnormal and inconclusive findings on diagnostic imaging of breast: Secondary | ICD-10-CM | POA: Diagnosis not present

## 2023-12-04 NOTE — Telephone Encounter (Signed)
 Appointment made for 12/09/2023 at 8:30 AM with Dr Garnette Olmsted  Patient also wanted to see if Dr Olmsted would send in a cough syrup in the meantime. Patient states he gave her this years ago and it helped a lot: Hydrocodone /Homatropine Syrup     FYI Only or Action Required?: Action required by provider: Patient requested a cough syrup be called in before appointment if possible.  Patient was last seen in primary care on 10/10/2022 by Olmsted Garnette LABOR, MD.  Called Nurse Triage reporting Cough.  Symptoms began 7-8 days ago.  Interventions attempted: OTC medications: cough syrup.  Symptoms are: gradually worsening.  Triage Disposition: Home Care  Patient/caregiver understands and will follow disposition?: Yes--Patient requested appointment with PCP              Copied from CRM [id number]. Topic: Clinical - Red Word Triage >> Dec 04, 2023  8:04 AM Emylou G wrote: [facility] that prompted transfer to Nurse Triage: persistant cough Reason for Disposition  Cough  Answer Assessment - Initial Assessment Questions Appointment made for 12/09/2023 at 8:30 AM with Dr Garnette Olmsted Patient was offered sooner appointments but only wants to see her PCP Dr Olmsted & was unable to come to his first available appointments on Tuesday 12/08/2023 but she is able to come on Wednesday 12/09/2023 at 8:30 AM  Patient also wanted to see if Dr Olmsted would send in a cough syrup in the meantime. Patient states he gave her this years ago and it helped a lot: Hydrocodone /Homatropine Syrup     1. ONSET: When did the cough begin?      7-8 days ago 2. SEVERITY: How bad is the cough today?      persistent 3. SPUTUM: Describe the color of your sputum (e.g., none, dry cough; clear, white, yellow, green)     N/A 4. HEMOPTYSIS: Are you coughing up any blood? If Yes, ask: How much? (e.g., flecks, streaks, tablespoons, etc.)     N/A 5. DIFFICULTY BREATHING: Are you having difficulty breathing? If Yes, ask: How bad  is it? (e.g., mild, moderate, severe)      NO 6. FEVER: Do you have a fever? If Yes, ask: What is your temperature, how was it measured, and when did it start?     No 7. CARDIAC HISTORY: Do you have any history of heart disease? (e.g., heart attack, congestive heart failure)      CHF 8. LUNG HISTORY: Do you have any history of lung disease?  (e.g., pulmonary embolus, asthma, emphysema)     Asthma 9. PE RISK FACTORS: Do you have a history of blood clots? (or: recent major surgery, recent prolonged travel, bedridden)     No 10. OTHER SYMPTOMS: Do you have any other symptoms? (e.g., runny nose, wheezing, chest pain)       Starts with sore throat and congestion  Protocols used: Cough - Acute Non-Productive-A-AH

## 2023-12-04 NOTE — Telephone Encounter (Signed)
 Left a detailed message at the patient's cell number medications are not sent in without a visit and she has not been seen in over 1 yr.  Advised to call the office to schedule an appt.

## 2023-12-08 ENCOUNTER — Other Ambulatory Visit: Payer: Self-pay | Admitting: Cardiology

## 2023-12-08 ENCOUNTER — Other Ambulatory Visit: Payer: Self-pay

## 2023-12-08 ENCOUNTER — Telehealth: Payer: Self-pay | Admitting: Cardiovascular Disease

## 2023-12-08 MED ORDER — BISOPROLOL FUMARATE 5 MG PO TABS: 5.0000 mg | ORAL_TABLET | Freq: Every day | ORAL | 0 refills | Status: DC

## 2023-12-08 MED ORDER — SACUBITRIL-VALSARTAN 97-103 MG PO TABS: 1.0000 | ORAL_TABLET | Freq: Two times a day (BID) | ORAL | 0 refills | Status: DC

## 2023-12-08 NOTE — Telephone Encounter (Signed)
*  STAT* If patient is at the pharmacy, call can be transferred to refill team.   1. Which medications need to be refilled? (please list name of each medication and dose if known) sacubitril -valsartan  (ENTRESTO ) 97-103 MG  bisoprolol  (ZEBETA ) 5 MG tablet   2. Which pharmacy/location (including street and city if local pharmacy) is medication to be sent to?  [id number] - [place], [place] - [address]      3. Do they need a 30 day or 90 day supply? 90 day    Pt is out of medication

## 2023-12-09 ENCOUNTER — Ambulatory Visit: Admitting: Family Medicine

## 2023-12-10 MED ORDER — SACUBITRIL-VALSARTAN 97-103 MG PO TABS: 1.0000 | ORAL_TABLET | Freq: Two times a day (BID) | ORAL | 0 refills | Status: DC

## 2023-12-10 NOTE — Addendum Note (Signed)
 Addended by: MATHEW LAYMON SAILOR on: 12/10/2023 12:51 PM   Modules accepted: Orders

## 2023-12-11 ENCOUNTER — Ambulatory Visit: Admitting: Family Medicine

## 2023-12-11 ENCOUNTER — Encounter: Payer: Self-pay | Admitting: Family Medicine

## 2023-12-11 ENCOUNTER — Ambulatory Visit (HOSPITAL_BASED_OUTPATIENT_CLINIC_OR_DEPARTMENT_OTHER): Admitting: Cardiology

## 2023-12-11 ENCOUNTER — Encounter (HOSPITAL_BASED_OUTPATIENT_CLINIC_OR_DEPARTMENT_OTHER): Payer: Self-pay | Admitting: Cardiology

## 2023-12-11 VITALS — BP 114/76 | HR 63 | Ht 62.0 in | Wt 178.0 lb

## 2023-12-11 VITALS — BP 110/78 | HR 68 | Temp 98.2°F | Wt 177.2 lb

## 2023-12-11 DIAGNOSIS — J4520 Mild intermittent asthma, uncomplicated: Secondary | ICD-10-CM

## 2023-12-11 DIAGNOSIS — J069 Acute upper respiratory infection, unspecified: Secondary | ICD-10-CM

## 2023-12-11 DIAGNOSIS — I2510 Atherosclerotic heart disease of native coronary artery without angina pectoris: Secondary | ICD-10-CM

## 2023-12-11 DIAGNOSIS — I10 Essential (primary) hypertension: Secondary | ICD-10-CM

## 2023-12-11 DIAGNOSIS — I428 Other cardiomyopathies: Secondary | ICD-10-CM | POA: Diagnosis not present

## 2023-12-11 DIAGNOSIS — E7800 Pure hypercholesterolemia, unspecified: Secondary | ICD-10-CM

## 2023-12-11 DIAGNOSIS — I5042 Chronic combined systolic (congestive) and diastolic (congestive) heart failure: Secondary | ICD-10-CM

## 2023-12-11 MED ORDER — ROSUVASTATIN CALCIUM 5 MG PO TABS: 5.0000 mg | ORAL_TABLET | Freq: Every day | ORAL | 3 refills | Status: AC

## 2023-12-11 MED ORDER — MELOXICAM 15 MG PO TABS: 15.0000 mg | ORAL_TABLET | Freq: Every day | ORAL | 3 refills | Status: AC

## 2023-12-11 MED ORDER — SPIRONOLACTONE 25 MG PO TABS: 12.5000 mg | ORAL_TABLET | Freq: Every day | ORAL | 3 refills | Status: AC

## 2023-12-11 MED ORDER — BISOPROLOL FUMARATE 5 MG PO TABS: 5.0000 mg | ORAL_TABLET | Freq: Every day | ORAL | 3 refills | Status: AC

## 2023-12-11 MED ORDER — METHYLPREDNISOLONE 4 MG PO TBPK
ORAL_TABLET | ORAL | 0 refills | Status: DC
Start: 2023-12-11 — End: 2024-01-11

## 2023-12-11 MED ORDER — SACUBITRIL-VALSARTAN 97-103 MG PO TABS: 1.0000 | ORAL_TABLET | Freq: Two times a day (BID) | ORAL | 3 refills | Status: DC

## 2023-12-11 MED ORDER — EZETIMIBE 10 MG PO TABS: 10.0000 mg | ORAL_TABLET | Freq: Every day | ORAL | 3 refills | Status: AC

## 2023-12-11 MED ORDER — HYDROCODONE BIT-HOMATROP MBR 5-1.5 MG/5ML PO SOLN: 5.0000 mL | ORAL | 0 refills | Status: AC | PRN

## 2023-12-11 NOTE — Progress Notes (Signed)
   Subjective:    Patient ID: Tina May, female    DOB: December 26, 1945, 78 y.o.   MRN: [id number]  HPI Here for 7 days of a dry cough. This started with a ST and PND, but these symptoms have resolved. No fever or SOB.    Review of Systems  Constitutional: Negative.   HENT: Negative.    Eyes: Negative.   Respiratory:  Positive for cough. Negative for shortness of breath and wheezing.   Cardiovascular: Negative.        Objective:   Physical Exam Constitutional:      Appearance: Normal appearance. She is not ill-appearing.  HENT:     Right Ear: Tympanic membrane, ear canal and external ear normal.     Left Ear: Tympanic membrane, ear canal and external ear normal.     Nose: Nose normal.     Mouth/Throat:     Pharynx: Oropharynx is clear.  Eyes:     Conjunctiva/sclera: Conjunctivae normal.  Pulmonary:     Effort: Pulmonary effort is normal.     Breath sounds: Normal breath sounds.  Lymphadenopathy:     Cervical: No cervical adenopathy.  Neurological:     Mental Status: She is alert.           Assessment & Plan:  Viral URI which has triggered some upper airway inflammation. Treat with a Medrol  dose pack. Recheck as needed. Garnette Olmsted, MD

## 2023-12-11 NOTE — Patient Instructions (Signed)
 Medication Instructions:  Your physician recommends that you continue on your current medications as directed. Please refer to the Current Medication list given to you today.  *If you need a refill on your cardiac medications before your next appointment, please call your pharmacy*  Lab Work: NONE  Testing/Procedures: NONE  Follow-Up: At [facility], you and your health needs are our priority.  As part of our continuing mission to provide you with exceptional heart care, we have created designated Provider Care Teams.  These Care Teams include your primary Cardiologist (physician) and Advanced Practice Providers (APPs -  Physician Assistants and Nurse Practitioners) who all work together to provide you with the care you need, when you need it.  We recommend signing up for the patient portal called MyChart.  Sign up information is provided on this After Visit Summary.  MyChart is used to connect with patients for Virtual Visits (Telemedicine).  Patients are able to view lab/test results, encounter notes, upcoming appointments, etc.  Non-urgent messages can be sent to your provider as well.   To learn more about what you can do with MyChart, go to [email or web address].    Your next appointment:   12 month(s)  The format for your next appointment:   In Person  Provider:   DR LONNI RECHE ORN NP, OR ROSALINE RAMAN NP

## 2023-12-11 NOTE — Progress Notes (Signed)
 Cardiology Office Note:  .   Date:  12/11/2023  ID:  Tina May, Tina May 11/15/1945, MRN [id number] PCP: Johnny Garnette LABOR, MD  [facility] Providers Cardiologist:  Shelda Bruckner, MD {  History of Present Illness: .   Tina May is a 78 y.o. female with PMH chronic systolic and diastolic heart failure, hyperlipidemia, COPS, hypertension. She was previously followed by Dr. Alveta and established care with me on 12/11/23.   Pertinent CV history: Echo 2018 with EF 25-30%. Started on GDMT. Cath 2018 with only 10% RCA lesion, normal LVEDP. Most recent echo 2019 with EF 45-50%.   Today: Overall doing well. Plays golf 3-4 times/week, maintains 2 acres and gardens, travels around the world. Tries to walk 3 miles/day in small increments.   Has been a long process to get her on a med regimen that she tolerates, doing very well now. Reviewed meds today.  Watches her salt, only notices mild fluid retention if she is traveling and accidentally eats high salt.  ROS: Denies chest pain, shortness of breath at rest or with normal exertion. No PND, orthopnea, LE edema or unexpected weight gain. No syncope or palpitations. ROS otherwise negative except as noted.   Studies Reviewed: SABRA    EKG:  EKG Interpretation Date/Time:  Friday December 11 2023 11:55:17 EDT Ventricular Rate:  61 PR Interval:  224 QRS Duration:  86 QT Interval:  460 QTC Calculation: 463 R Axis:   -51  Text Interpretation: Sinus rhythm with 1st degree A-V block Left axis deviation Low voltage QRS Inferior infarct , age undetermined Cannot rule out Anteroseptal infarct (cited on or before 11-Dec-2023) Confirmed by Bruckner Shelda [phone]) on 12/11/2023 12:33:03 PM    Physical Exam:   VS:  BP 114/76   Pulse 63   Ht 5' 2 (1.575 m)   Wt 178 lb (80.7 kg)   SpO2 96%   BMI 32.56 kg/m    Wt Readings from Last 3 Encounters:  12/11/23 178 lb (80.7 kg)  12/11/23 177 lb 3.2 oz (80.4 kg)  12/29/22 174 lb (78.9 kg)     GEN: Well nourished, well developed in no acute distress HEENT: Normal, moist mucous membranes NECK: No JVD CARDIAC: regular rhythm, normal S1 and S2, no rubs or gallops. No murmur. VASCULAR: Radial and DP pulses 2+ bilaterally. No carotid bruits RESPIRATORY:  Clear to auscultation without rales, wheezing or rhonchi  ABDOMEN: Soft, non-tender, non-distended MUSCULOSKELETAL:  Ambulates independently SKIN: Warm and dry, no edema NEUROLOGIC:  Alert and oriented x 3. No focal neuro deficits noted. PSYCHIATRIC:  Normal affect    ASSESSMENT AND PLAN: .    Chronic systolic and diastolic heart failure Nonischemic cardiomyopathy Hypertension -NYHA class I, very active -tolerating bisoprolol , entresto , spironolactone   Nonobstructive CAD Hypercholesterolemia -on rosuvastatin  and ezetimibe  -did not tolerate atorvastatin at all, only tolerates low dose rosuvastatin  -bruises easily, declines aspirin   CV risk counseling and prevention -recommend heart healthy/Mediterranean diet, with whole grains, fruits, vegetable, fish, lean meats, nuts, and olive oil. Limit salt. -recommend moderate walking, 3-5 times/week for 30-50 minutes each session. Aim for at least 150 minutes.week. Goal should be pace of 3 miles/hours, or walking 1.5 miles in 30 minutes -recommend avoidance of tobacco products. Avoid excess alcohol.  Dispo: 1 year or sooner as needed  Signed, Shelda Bruckner, MD   Shelda Bruckner, MD, PhD, [facility]  [facility]  [place]  [facility] at [facility] [address], [address] [phone]  [place], [place] [address] [phone]

## 2024-01-01 ENCOUNTER — Ambulatory Visit: Payer: PPO

## 2024-01-01 VITALS — Ht 62.0 in | Wt 178.0 lb

## 2024-01-01 DIAGNOSIS — Z0000 Encounter for general adult medical examination without abnormal findings: Secondary | ICD-10-CM | POA: Diagnosis not present

## 2024-01-01 NOTE — Patient Instructions (Signed)
 Ms. Tina May , Thank you for taking time out of your busy schedule to complete your Annual Wellness Visit with me. I enjoyed our conversation and look forward to speaking with you again next year. I, as well as your care team,  appreciate your ongoing commitment to your health goals. Please review the following plan we discussed and let me know if I can assist you in the future. Your Game plan/ To Do List    Referrals: If you haven't heard from the office you've been referred to, please reach out to them at the phone provided.   Follow up Visits: We will see or speak with you next year for your Next Medicare AWV with our clinical staff 01/06/25 @ 8:40a  Have you seen your provider in the last 6 months (3 months if uncontrolled diabetes)?   Clinician Recommendations:  Aim for 30 minutes of exercise or brisk walking, 6-8 glasses of water, and 5 servings of fruits and vegetables each day.       This is a list of the screenings recommended for you:  Health Maintenance  Topic Date Due   Hepatitis C Screening  Never done   Pneumococcal Vaccine for age over 38 (1 of 2 - PCV) Never done   Zoster (Shingles) Vaccine (1 of 2) Never done   COVID-19 Vaccine (3 - Moderna risk series) 04/18/2020   Flu Shot  12/04/2023   Medicare Annual Wellness Visit  12/31/2024   DTaP/Tdap/Td vaccine (2 - Td or Tdap) 02/20/2028   DEXA scan (bone density measurement)  Completed   HPV Vaccine  Aged Out   Meningitis B Vaccine  Aged Out   Colon Cancer Screening  Discontinued    Advanced directives: (Copy Requested) Please bring a copy of your health care power of attorney and living will to the office to be added to your chart at your convenience. You can mail to [facility] [address] [place], [place] [address] or email to [email or web address] Advance Care Planning is important because it:  [x]  Makes sure you receive the medical care that is consistent with your values, goals, and  preferences  [x]  It provides guidance to your family and loved ones and reduces their decisional burden about whether or not they are making the right decisions based on your wishes.  Follow the link provided in your after visit summary or read over the paperwork we have mailed to you to help you started getting your Advance Directives in place. If you need assistance in completing these, please reach out to us  so that we can help you!  See attachments for Preventive Care and Fall Prevention Tips.

## 2024-01-01 NOTE — Progress Notes (Signed)
 Subjective:   Tina May is a 78 y.o. who presents for a Medicare Wellness preventive visit.  As a reminder, Annual Wellness Visits don't include a physical exam, and some assessments may be limited, especially if this visit is performed virtually. We may recommend an in-person follow-up visit with your provider if needed.  Visit Complete: Virtual I connected with  Tina May on 01/01/24 by a audio enabled telemedicine application and verified that I am speaking with the correct person using two identifiers.  Patient Location: Home  Provider Location: Home Office  I discussed the limitations of evaluation and management by telemedicine. The patient expressed understanding and agreed to proceed.  Vital Signs: Because this visit was a virtual/telehealth visit, some criteria may be missing or patient reported. Any vitals not documented were not able to be obtained and vitals that have been documented are patient reported.    Persons Participating in Visit: Patient.  AWV Questionnaire: Yes: Patient Medicare AWV questionnaire was completed by the patient on 12/28/23; I have confirmed that all information answered by patient is correct and no changes since this date.  Cardiac Risk Factors include: advanced age (>39men, >41 women);hypertension     Objective:    Today's Vitals   01/01/24 0838  Weight: 178 lb (80.7 kg)  Height: 5' 2 (1.575 m)   Body mass index is 32.56 kg/m.     01/01/2024    8:46 AM 12/29/2022    8:29 AM 01/26/2022    7:32 PM 09/13/2021    9:11 AM 01/21/2021   10:57 AM 01/13/2019    6:58 AM 11/26/2018    9:10 AM  Advanced Directives  Does Patient Have a Medical Advance Directive? Yes Yes No No Yes Yes Yes  Type of Estate agent of [place];Living will Healthcare Power of [place];Living will   Healthcare Power of [place];Living will Living will Healthcare Power of [place];Living will  Does patient want to make changes to medical  advance directive?      No - Patient declined No - Patient declined   Copy of Healthcare Power of Attorney in Chart? No - copy requested No - copy requested   No - copy requested    Would patient like information on creating a medical advance directive?   No - Patient declined No - Patient declined        Data saved with a previous flowsheet row definition    Current Medications (verified) Outpatient Encounter Medications as of 01/01/2024  Medication Sig   HYDROcodone  bit-homatropine (HYCODAN) 5-1.5 MG/5ML syrup Take 5 mLs by mouth every 4 (four) hours as needed.   albuterol  (VENTOLIN  HFA) 108 (90 Base) MCG/ACT inhaler Inhale 2 puffs into the lungs every 6 (six) hours as needed for wheezing or shortness of breath.   bisoprolol  (ZEBETA ) 5 MG tablet Take 1 tablet (5 mg total) by mouth daily.   ezetimibe  (ZETIA ) 10 MG tablet Take 1 tablet (10 mg total) by mouth daily.   FIBER SELECT GUMMIES CHEW Chew 2 tablets by mouth daily. Total daily dose 10mg .   Hyaluronic Acid-Vitamin C (HYALURONIC ACID PO) Take 1 tablet by mouth daily. Women's Hyaluronic Acid   meloxicam  (MOBIC ) 15 MG tablet Take 1 tablet (15 mg total) by mouth daily.   methylPREDNISolone  (MEDROL  DOSEPAK) 4 MG TBPK tablet As directed   Multiple Vitamin (MULTIVITAMIN WITH MINERALS) TABS tablet Take 1 tablet by mouth daily. One-A-Day   PREMARIN  0.625 MG tablet Take 0.625 mg by mouth once a week.  RESTASIS  0.05 % ophthalmic emulsion Place 1 drop into both eyes 2 (two) times daily.    rosuvastatin  (CRESTOR ) 5 MG tablet Take 1 tablet (5 mg total) by mouth daily.   sacubitril -valsartan  (ENTRESTO ) 97-103 MG Take 1 tablet by mouth 2 (two) times daily.   spironolactone  (ALDACTONE ) 25 MG tablet Take 0.5 tablets (12.5 mg total) by mouth daily.   TURMERIC CURCUMIN PO Take 1,000 mg by mouth daily.   No facility-administered encounter medications on file as of 01/01/2024.    Allergies (verified) Amlodipine , Atorvastatin, Diclofenac , and Morphine  and codeine   History: Past Medical History:  Diagnosis Date   Arthritis    Complex cyst of right ovary    DDD (degenerative disc disease), lumbar    Diverticulosis    External hemorrhoids    History of basal cell carcinoma (BCC) excision    HTN (hypertension)    Hyperlipidemia    Intermittent palpitations    event monitor 10-27-2018 (Dr Alveta)  SR including ST and SB with two episodes NSVT   Mild asthma    NICM (nonischemic cardiomyopathy) (HCC)    Dr Alveta, dx 06/ 2018  ef 25-30%;   stress echo 12-22-2016 no ischemia, ef 30-35%; last echo 04-22-2018 ef 45-50%; cath 01-27-2017  no sig. cad,10% pRCA to mRCA   Pancreatic lesion 03/11/2013   last CT abd. 10-27-2018, stable   Spinal stenosis of lumbar region    Systolic CHF, chronic (HCC) 10/2016   followed by Dr Alveta;  per echo 04-22-2018 ef 45-50%, G1DD   Past Surgical History:  Procedure Laterality Date   COLONOSCOPY  01/25/2014   per Dr. Obie, diverticulae only, repeat in 10 yrs    HAND TENDON SURGERY Right 2014  approx.   HEMICOLECTOMY  2005   resection colon tumor, per pt benign   LEFT HEART CATH AND CORONARY ANGIOGRAPHY N/A 01/27/2017   Procedure: LEFT HEART CATH AND CORONARY ANGIOGRAPHY;  Surgeon: Swaziland, Peter M, MD;  Location[facility] INVASIVE CV LAB;  Service: Cardiovascular;  Laterality: N/A;   ROBOTIC ASSISTED SALPINGO OOPHERECTOMY Bilateral 01/13/2019   Procedure: XI ROBOTIC ASSISTED SALPINGO OOPHORECTOMY;  Surgeon: Darcel Pool, MD;  Location: [facility];  Service: Gynecology;  Laterality: Bilateral;   SPINE SURGERY  04/02/2020   lumbar spine surgery at [facility]    TONSILLECTOMY  age 19   VAGINAL HYSTERECTOMY  1994   Family History  Problem Relation Age of Onset   Hyperlipidemia Other    Hypertension Other    Prostate cancer Other    Healthy Sister    Healthy Brother    Colon cancer Neg Hx    Esophageal cancer Neg Hx    Inflammatory bowel disease Neg Hx    Liver disease Neg Hx    Pancreatic  cancer Neg Hx    Rectal cancer Neg Hx    Stomach cancer Neg Hx    Social History   Socioeconomic History   Marital status: Widowed    Spouse name: Not on file   Number of children: 3   Years of education: Not on file   Highest education level: Some college, no degree  Occupational History   Occupation: Retired  Tobacco Use   Smoking status: Former    Current packs/day: 0.00    Average packs/day: 1 pack/day for 20.0 years (20.0 ttl pk-yrs)    Types: Cigarettes    Start date: 05/06/1971    Quit date: 05/06/1991    Years since quitting: 32.6   Smokeless tobacco: Never  Vaping Use   Vaping status: Never Used  Substance and Sexual Activity   Alcohol use: Yes    Alcohol/week: 8.0 standard drinks of alcohol    Types: 7 Glasses of wine, 1 Standard drinks or equivalent per week   Drug use: Never   Sexual activity: Yes    Birth control/protection: Surgical  Other Topics Concern   Not on file  Social History Narrative   Has homes in [place] and [place], [place] (winters there).   Social Drivers of Corporate investment banker Strain: Low Risk  (01/01/2024)   Overall Financial Resource Strain (CARDIA)    Difficulty of Paying Living Expenses: Not hard at all  Food Insecurity: No Food Insecurity (01/01/2024)   Hunger Vital Sign    Worried About Running Out of Food in the Last Year: Never true    Ran Out of Food in the Last Year: Never true  Transportation Needs: No Transportation Needs (01/01/2024)   PRAPARE - Administrator, Civil Service (Medical): No    Lack of Transportation (Non-Medical): No  Physical Activity: Sufficiently Active (01/01/2024)   Exercise Vital Sign    Days of Exercise per Week: 3 days    Minutes of Exercise per Session: 150+ min  Stress: No Stress Concern Present (01/01/2024)   [employer] of Occupational Health - Occupational Stress Questionnaire    Feeling of Stress: Not at all  Social Connections: Moderately Integrated (01/01/2024)   Social  Connection and Isolation Panel    Frequency of Communication with Friends and Family: More than three times a week    Frequency of Social Gatherings with Friends and Family: More than three times a week    Attends Religious Services: More than 4 times per year    Active Member of [employer] or Organizations: Yes    Attends Banker Meetings: More than 4 times per year    Marital Status: Widowed    Tobacco Counseling Counseling given: Not Answered    Clinical Intake:  Pre-visit preparation completed: Yes  Pain : No/denies pain     BMI - recorded: 32.56 Nutritional Status: BMI > 30  Obese Nutritional Risks: None Diabetes: No  Lab Results  Component Value Date   HGBA1C 5.4 10/15/2022   HGBA1C 5.9 03/20/2021   HGBA1C 5.6 04/11/2020     How often do you need to have someone help you when you read instructions, pamphlets, or other written materials from your doctor or pharmacy?: 1 - Never  Interpreter Needed?: No  Information entered by :: Rojelio Blush LPN   Activities of Daily Living      01/01/2024    8:45 AM 12/28/2023    6:36 PM  In your present state of health, do you have any difficulty performing the following activities:  Hearing? 0 0  Vision? 0 0  Difficulty concentrating or making decisions? 0 0  Walking or climbing stairs? 1 1  Comment Uses a Cane   Dressing or bathing? 0 0  Doing errands, shopping? 0 0  Preparing Food and eating ? N N  Using the Toilet? N N  In the past six months, have you accidently leaked urine? N N  Do you have problems with loss of bowel control? N N  Managing your Medications? N N  Managing your Finances? N N  Housekeeping or managing your Housekeeping? N N    Patient Care Team: Johnny Garnette LABOR, MD as PCP - General Lonni Slain, MD as PCP -  Cardiology (Cardiology) Liane Sharyne MATSU, [facility] (Inactive) as Pharmacist (Pharmacist)   I have updated your Care Teams any recent Medical Services you may have  received from other providers in the past year.     Assessment:   This is a routine wellness examination for Tina May.  Hearing/Vision screen Hearing Screening - Comments:: Denies hearing difficulties   Vision Screening - Comments::  - up to date with routine eye exams with  Dr Glendia   Goals Addressed               This Visit's Progress     Continue physical activity (pt-stated)        Remain active       Depression Screen      01/01/2024    8:44 AM 12/29/2022    8:27 AM 10/10/2022   10:23 AM 01/27/2022    4:37 PM 09/09/2021    9:36 AM 01/21/2021   10:59 AM 01/21/2021   10:55 AM  PHQ 2/9 Scores  PHQ - 2 Score 0 0 0 0 0 0 0  PHQ- 9 Score   0 6 0      Fall Risk      01/01/2024    8:45 AM 12/28/2023    6:36 PM 12/29/2022    8:29 AM 10/10/2022   10:23 AM 01/27/2022    4:36 PM  Fall Risk   Falls in the past year? 0 0 1 0 0  Number falls in past yr: 0 0 0 0 0  Injury with Fall? 0 0 0 0 0  Risk for fall due to : No Fall Risks  No Fall Risks No Fall Risks No Fall Risks  Follow up Falls evaluation completed  Falls prevention discussed Falls evaluation completed Falls evaluation completed      Data saved with a previous flowsheet row definition    MEDICARE RISK AT HOME:   Medicare Risk at Home Any stairs in or around the home?: Yes If so, are there any without handrails?: No Home free of loose throw rugs in walkways, pet beds, electrical cords, etc?: Yes Adequate lighting in your home to reduce risk of falls?: Yes Life alert?: No Use of a cane, walker or w/c?: Yes Grab bars in the bathroom?: No Shower chair or bench in shower?: No Elevated toilet seat or a handicapped toilet?: No  TIMED UP AND GO:  Was the test performed?  No  Cognitive Function: 6CIT completed        01/01/2024    8:46 AM 12/29/2022    8:29 AM  6CIT Screen  What Year? 0 points 0 points  What month? 0 points 0 points  What time? 0 points 0 points  Count back from 20 0 points 0 points  Months  in reverse 0 points 0 points  Repeat phrase 0 points 0 points  Total Score 0 points 0 points    Immunizations Immunization History  Administered Date(s) Administered   Influenza Inj Mdck Quad Pf 03/02/2017, 04/19/2018   Influenza Split 03/04/2011, 03/12/2011   Influenza Whole 02/11/2010   Influenza,inj,Quad PF,6+ Mos 05/04/2015, 03/02/2017, 01/28/2019, 04/11/2020, 02/12/2021   Influenza,inj,quad, With Preservative 03/05/2016   Influenza-Unspecified 01/08/2016   Moderna SARS-COV2 Booster Vaccination 03/21/2020   Moderna Sars-Covid-2 Vaccination 07/11/2019, 08/03/2019   Tdap 02/19/2018    Screening Tests Health Maintenance  Topic Date Due   Hepatitis C Screening  Never done   Pneumococcal Vaccine: 50+ Years (1 of 2 - PCV) Never done   Zoster Vaccines-  Shingrix (1 of 2) Never done   COVID-19 Vaccine (3 - Moderna risk series) 04/18/2020   INFLUENZA VACCINE  12/04/2023   Medicare Annual Wellness (AWV)  12/31/2024   DTaP/Tdap/Td (2 - Td or Tdap) 02/20/2028   DEXA SCAN  Completed   HPV VACCINES  Aged Out   Meningococcal B Vaccine  Aged Out   Colonoscopy  Discontinued    Health Maintenance  Health Maintenance Due  Topic Date Due   Hepatitis C Screening  Never done   Pneumococcal Vaccine: 50+ Years (1 of 2 - PCV) Never done   Zoster Vaccines- Shingrix (1 of 2) Never done   COVID-19 Vaccine (3 - Moderna risk series) 04/18/2020   INFLUENZA VACCINE  12/04/2023   Health Maintenance Items Addressed:   Additional Screening:  Vision Screening: Recommended annual ophthalmology exams for early detection of glaucoma and other disorders of the eye. Would you like a referral to an eye doctor? No    Dental Screening: Recommended annual dental exams for proper oral hygiene  Community Resource Referral / Chronic Care Management: CRR required this visit?  No   CCM required this visit?  No   Plan:    I have personally reviewed and noted the following in the patient's chart:    Medical and social history Use of alcohol, tobacco or illicit drugs  Current medications and supplements including opioid prescriptions. Patient is not currently taking opioid prescriptions. Functional ability and status Nutritional status Physical activity Advanced directives List of other physicians Hospitalizations, surgeries, and ER visits in previous 12 months Vitals Screenings to include cognitive, depression, and falls Referrals and appointments  In addition, I have reviewed and discussed with patient certain preventive protocols, quality metrics, and best practice recommendations. A written personalized care plan for preventive services as well as general preventive health recommendations were provided to patient.   Rojelio LELON Blush, LPN   1/[id number]/7974   After Visit Summary: (MyChart) Due to this being a telephonic visit, the after visit summary with patients personalized plan was offered to patient via MyChart   Notes: Nothing significant to report at this time.

## 2024-01-09 ENCOUNTER — Emergency Department (HOSPITAL_COMMUNITY)

## 2024-01-09 ENCOUNTER — Emergency Department (HOSPITAL_COMMUNITY)
Admission: EM | Admit: 2024-01-09 | Discharge: 2024-01-09 | Disposition: A | Discharge status: Home / Self Care | Attending: Emergency Medicine | Admitting: Emergency Medicine

## 2024-01-09 DIAGNOSIS — M549 Dorsalgia, unspecified: Secondary | ICD-10-CM

## 2024-01-09 DIAGNOSIS — I509 Heart failure, unspecified: Secondary | ICD-10-CM | POA: Insufficient documentation

## 2024-01-09 DIAGNOSIS — M62830 Muscle spasm of back: Secondary | ICD-10-CM | POA: Insufficient documentation

## 2024-01-09 DIAGNOSIS — I110 Hypertensive heart disease with heart failure: Secondary | ICD-10-CM | POA: Insufficient documentation

## 2024-01-09 DIAGNOSIS — M25511 Pain in right shoulder: Secondary | ICD-10-CM | POA: Diagnosis not present

## 2024-01-09 DIAGNOSIS — J45909 Unspecified asthma, uncomplicated: Secondary | ICD-10-CM | POA: Insufficient documentation

## 2024-01-09 DIAGNOSIS — M545 Low back pain, unspecified: Secondary | ICD-10-CM | POA: Diagnosis not present

## 2024-01-09 DIAGNOSIS — M546 Pain in thoracic spine: Secondary | ICD-10-CM | POA: Diagnosis present

## 2024-01-09 LAB — COMPREHENSIVE METABOLIC PANEL WITH GFR
ALT: 26 U/L (ref 0–44)
AST: 26 U/L (ref 15–41)
Albumin: 4.6 g/dL (ref 3.5–5.0)
Alkaline Phosphatase: 60 U/L (ref 38–126)
Anion gap: 12 (ref 5–15)
BUN: 28 mg/dL — ABNORMAL HIGH (ref 8–23)
CO2: 25 mmol/L (ref 22–32)
Calcium: 9.6 mg/dL (ref 8.9–10.3)
Chloride: 103 mmol/L (ref 98–111)
Creatinine, Ser: 0.76 mg/dL (ref 0.44–1.00)
GFR, Estimated: >60 mL/min (ref >60)
Glucose, Bld: 117 mg/dL — ABNORMAL HIGH (ref 70–99)
Potassium: 4.4 mmol/L (ref 3.5–5.1)
Sodium: 140 mmol/L (ref 135–145)
Total Bilirubin: 0.3 mg/dL (ref 0.0–1.2)
Total Protein: 7.1 g/dL (ref 6.5–8.1)

## 2024-01-09 LAB — CBC WITH DIFFERENTIAL/PLATELET
Abs Immature Granulocytes: 0.01 K/uL (ref 0.00–0.07)
Basophils Absolute: 0.1 K/uL (ref 0.0–0.1)
Basophils Relative: 1 %
Eosinophils Absolute: 0.2 K/uL (ref 0.0–0.5)
Eosinophils Relative: 4 %
HCT: 46.4 % — ABNORMAL HIGH (ref 36.0–46.0)
Hemoglobin: 14.7 g/dL (ref 12.0–15.0)
Immature Granulocytes: 0 %
Lymphocytes Relative: 21 %
Lymphs Abs: 1.1 K/uL (ref 0.7–4.0)
MCH: 31.3 pg (ref 26.0–34.0)
MCHC: 31.7 g/dL (ref 30.0–36.0)
MCV: 98.9 fL (ref 80.0–100.0)
Monocytes Absolute: 0.5 K/uL (ref 0.1–1.0)
Monocytes Relative: 10 %
Neutro Abs: 3.3 K/uL (ref 1.7–7.7)
Neutrophils Relative %: 64 %
Platelets: 241 K/uL (ref 150–400)
RBC: 4.69 MIL/uL (ref 3.87–5.11)
RDW: 13.1 % (ref 11.5–15.5)
WBC: 5.2 K/uL (ref 4.0–10.5)
nRBC: 0 % (ref 0.0–0.2)

## 2024-01-09 LAB — MAGNESIUM: Magnesium: 2.2 mg/dL (ref 1.7–2.4)

## 2024-01-09 MED ORDER — METHOCARBAMOL 750 MG PO TABS: 750.0000 mg | ORAL_TABLET | Freq: Two times a day (BID) | ORAL | 0 refills | Status: DC

## 2024-01-09 MED ORDER — LIDOCAINE 5 % EX PTCH
1.0000 | MEDICATED_PATCH | CUTANEOUS | Status: DC
  Administered 2024-01-09: 1 via TRANSDERMAL
  Filled 2024-01-09: qty 1

## 2024-01-09 MED ORDER — HYDROCODONE-ACETAMINOPHEN 5-325 MG PO TABS: 1.0000 | ORAL_TABLET | ORAL | 0 refills | Status: AC | PRN

## 2024-01-09 NOTE — ED Provider Notes (Signed)
 [place] EMERGENCY DEPARTMENT AT [facility] Provider Note   CSN: [id number] Arrival date & time: 01/09/24  9188     Patient presents with: Shoulder Pain   Tina May is a 78 y.o. female.   The history is provided by the patient and medical records. No language interpreter was used.  Back Pain Location:  Thoracic spine Quality:  Stabbing Radiates to:  R shoulder Pain severity:  Severe Pain is:  Unable to specify Onset quality:  Gradual Duration:  2 weeks Timing:  Constant Progression:  Waxing and waning Chronicity:  New Context: twisting   Context: not recent illness and not recent injury   Relieved by:  Nothing Worsened by:  Movement, twisting and touching Ineffective treatments:  None tried Associated symptoms: no abdominal pain, no abdominal swelling, no bladder incontinence, no bowel incontinence, no chest pain, no dysuria, no fever, no headaches, no leg pain, no numbness, no paresthesias and no weakness        Prior to Admission medications   Medication Sig Start Date End Date Taking? Authorizing Provider  albuterol  (VENTOLIN  HFA) 108 (90 Base) MCG/ACT inhaler Inhale 2 puffs into the lungs every 6 (six) hours as needed for wheezing or shortness of breath. 09/24/23   Johnny Garnette LABOR, MD  bisoprolol  (ZEBETA ) 5 MG tablet Take 1 tablet (5 mg total) by mouth daily. 12/11/23   Lonni Slain, MD  ezetimibe  (ZETIA ) 10 MG tablet Take 1 tablet (10 mg total) by mouth daily. 12/11/23   Johnny Garnette LABOR, MD  FIBER SELECT GUMMIES CHEW Chew 2 tablets by mouth daily. Total daily dose 10mg .    [provider]  Hyaluronic Acid-Vitamin C (HYALURONIC ACID PO) Take 1 tablet by mouth daily. Women's Hyaluronic Acid    [provider]  HYDROcodone  bit-homatropine (HYCODAN) 5-1.5 MG/5ML syrup Take 5 mLs by mouth every 4 (four) hours as needed. 12/11/23   Johnny Garnette LABOR, MD  meloxicam  (MOBIC ) 15 MG tablet Take 1 tablet (15 mg total) by mouth daily. 12/11/23    Johnny Garnette LABOR, MD  methylPREDNISolone  (MEDROL  DOSEPAK) 4 MG TBPK tablet As directed 12/11/23   Johnny Garnette LABOR, MD  Multiple Vitamin (MULTIVITAMIN WITH MINERALS) TABS tablet Take 1 tablet by mouth daily. One-A-Day    [provider]  PREMARIN  0.625 MG tablet Take 0.625 mg by mouth once a week. 04/19/19   [provider]  RESTASIS  0.05 % ophthalmic emulsion Place 1 drop into both eyes 2 (two) times daily.  09/28/13   [provider]  rosuvastatin  (CRESTOR ) 5 MG tablet Take 1 tablet (5 mg total) by mouth daily. 12/11/23   Johnny Garnette LABOR, MD  sacubitril -valsartan  (ENTRESTO ) 97-103 MG Take 1 tablet by mouth 2 (two) times daily. 12/11/23   Lonni Slain, MD  spironolactone  (ALDACTONE ) 25 MG tablet Take 0.5 tablets (12.5 mg total) by mouth daily. 12/11/23   Lonni Slain, MD  TURMERIC CURCUMIN PO Take 1,000 mg by mouth daily.    [provider]    Allergies: Amlodipine , Atorvastatin, Diclofenac , and Morphine and codeine    Review of Systems  Constitutional:  Negative for chills, fatigue and fever.  HENT:  Negative for congestion.   Respiratory:  Negative for cough, chest tightness, shortness of breath and wheezing.   Cardiovascular:  Negative for chest pain and palpitations.  Gastrointestinal:  Negative for abdominal pain, bowel incontinence, constipation, nausea and vomiting.  Genitourinary:  Negative for bladder incontinence, dysuria and flank pain.  Musculoskeletal:  Positive for back pain.  Negative for neck pain and neck stiffness.  Skin:  Negative for rash and wound.  Neurological:  Negative for syncope, weakness, light-headedness, numbness, headaches and paresthesias.  Psychiatric/Behavioral:  Negative for agitation and confusion.   All other systems reviewed and are negative.   Updated Vital Signs BP 128/70 (BP Location: Left Arm)   Pulse 71   Temp 97.9 F (36.6 C) (Oral)   Resp 17   Ht 5' 2 (1.575 m)   Wt 80.7 kg   SpO2 97%   BMI  32.56 kg/m   Physical Exam Vitals and nursing note reviewed.  Constitutional:      General: She is not in acute distress.    Appearance: She is well-developed. She is not ill-appearing, toxic-appearing or diaphoretic.  HENT:     Head: Normocephalic and atraumatic.     Nose: No congestion or rhinorrhea.     Mouth/Throat:     Mouth: Mucous membranes are moist.     Pharynx: No oropharyngeal exudate.  Eyes:     Extraocular Movements: Extraocular movements intact.     Conjunctiva/sclera: Conjunctivae normal.  Cardiovascular:     Rate and Rhythm: Normal rate and regular rhythm.     Heart sounds: No murmur heard. Pulmonary:     Effort: Pulmonary effort is normal. No respiratory distress.     Breath sounds: Normal breath sounds. No wheezing, rhonchi or rales.  Chest:     Chest wall: No tenderness.  Abdominal:     General: Abdomen is flat.     Palpations: Abdomen is soft.     Tenderness: There is no abdominal tenderness. There is no guarding or rebound.  Musculoskeletal:        General: Tenderness present. No swelling.     Cervical back: Neck supple. Spasms and tenderness present. No swelling, deformity, erythema, signs of trauma, lacerations, rigidity or bony tenderness. Pain with movement present.       Back:     Comments: Small area of tenderness in the right paraspinal back near the edge of the scapula.  Some spasm palpated but no fluctuance or crepitance or rash seen.  No other bony tenderness.  Skin:    General: Skin is warm and dry.     Capillary Refill: Capillary refill takes less than 2 seconds.     Findings: No erythema or rash.  Neurological:     General: No focal deficit present.     Mental Status: She is alert.     Sensory: No sensory deficit.     Motor: No weakness.  Psychiatric:        Mood and Affect: Mood normal.     (all labs ordered are listed, but only abnormal results are displayed) Labs Reviewed  CBC WITH DIFFERENTIAL/PLATELET - Abnormal; Notable for  the following components:      Result Value   HCT 46.4 (*)    All other components within normal limits  COMPREHENSIVE METABOLIC PANEL WITH GFR - Abnormal; Notable for the following components:   Glucose, Bld 117 (*)    BUN 28 (*)    All other components within normal limits  MAGNESIUM    EKG: None  Radiology: DG Chest 2 View Result Date: 01/09/2024 CLINICAL DATA:  Right-sided back pain for 2 weeks without known injury. EXAM: CHEST - 2 VIEW COMPARISON:  February 12, 2021. FINDINGS: The heart size and mediastinal contours are within normal limits. Both lungs are clear. The visualized skeletal structures are unremarkable. IMPRESSION: No active cardiopulmonary disease. Electronically  Signed   By: Lynwood Landy Raddle M.D.   On: 01/09/2024 09:20   DG Scapula Right Result Date: 01/09/2024 CLINICAL DATA:  Right shoulder blade pain for 2 weeks without known injury. EXAM: RIGHT SCAPULA - 2+ VIEWS COMPARISON:  None Available. FINDINGS: There is no evidence of fracture or other focal bone lesions. Soft tissues are unremarkable. IMPRESSION: Negative. Electronically Signed   By: Lynwood Landy Raddle M.D.   On: 01/09/2024 09:18     Procedures   Medications Ordered in the ED  lidocaine  (LIDODERM ) 5 % 1 patch (1 patch Transdermal Patch Applied 01/09/24 1049)                                    Medical Decision Making Amount and/or Complexity of Data Reviewed Labs: ordered. Radiology: ordered.  Risk Prescription drug management.    Tina May is a 78 y.o. female with a past medical history significant for diverticulosis, hyperlipidemia, asthma, hypertension, CHF, previous salpingo-oophorectomy, back surgery, and previous gout who presents with pain in her right upper back near her shoulder blade.  According to patient, for the last 2 weeks, she has had discomfort in a single spot about the size of a quarter in her right upper back just medial to the scapula.  She reports it hurts when she lays in  certain ways and when she moves it.  She denies any trauma.  Denies rash to suggest shingles.  Denies any fevers, chills, congestion, or cough.  Denies chest pain or shortness of breath.  Reports it is not exertional or pleuritic.  No pain with deep breathing.  No trauma reported.  She denies any low back pain abdominal pain or flank pains.  She denies any leg pain or leg swelling or history of blood clots.  She reports the pain is moderate but has not stopped her from playing 9 rounds of golf in the last 2 weeks with the pain.  She also does gardening and chores around the house and thinks she likely has a muscle problem that she is aggravating by continued use of it.  On exam, lungs clear.  Chest nontender.  No murmur.  Abdomen nontender.  Intact sensation strength and pulses in extremities.  Patient does have a small area of tenderness in her right upper back near the edge of the scapula.  There does feel to be some spasm there but no fluctuance or crepitance.  No skin changes seen.  No neck tenderness.  No midline bony tenderness.  Exam otherwise unremarkable.  Had a shared decision-making conversation with patient and we agree to get x-rays of her chest as well as her right scapula to look for bony abnormality but we have a very low suspicion for infectious etiology.  Given her symptoms have also low suspicion for thromboembolic etiology.  Due to the suspected muscle spasm we will get some screening labs to look for electrolyte disturbance that could contribute to muscle spasm.  If workup reassuring, anticipate discharge with prescription for muscle relaxant and Lidoderm  patches and possibly shot of Toradol  if her kidney function can tolerate it.  Anticipate discharge if workup reassuring with outpatient follow-up plans.       10:55 AM Patient's x-rays were reassuring and labs also reassuring.  Patient was able to pull out a long list of medications and she says she is only able to tolerate  methocarbamol  and hydrocodone  in regards  to muscle and pain medications.  Given her discomfort this is felt to be reasonable.  We agreed together to hold on more extensive lab or imaging workup and patient does not want a prescription for Lidoderm  patches as she says she already has them and cannot apply them well to herself.  Will give her 1 now before she is discharged.  Patient agrees with plan and follow-up instructions with the PCP.  She no questions or concerns and was discharged in good condition with understanding return precautions.      Final diagnoses:  Acute right-sided back pain, unspecified back location  Muscle spasm of back    ED Discharge Orders          Ordered    methocarbamol  (ROBAXIN ) 750 MG tablet  2 times daily        01/09/24 1050    HYDROcodone -acetaminophen  (NORCO/VICODIN) 5-325 MG tablet  Every 4 hours PRN        01/09/24 1050           Clinical Impression: 1. Acute right-sided back pain, unspecified back location   2. Muscle spasm of back     Disposition: Discharge  Condition: Good  I have discussed the results, Dx and Tx plan with the pt(& family if present). He/she/they expressed understanding and agree(s) with the plan. Discharge instructions discussed at great length. Strict return precautions discussed and pt &/or family have verbalized understanding of the instructions. No further questions at time of discharge.    New Prescriptions   HYDROCODONE -ACETAMINOPHEN  (NORCO/VICODIN) 5-325 MG TABLET    Take 1 tablet by mouth every 4 (four) hours as needed.   METHOCARBAMOL  (ROBAXIN ) 750 MG TABLET    Take 1 tablet (750 mg total) by mouth 2 (two) times daily for 7 days.    Follow Up: Johnny Garnette LABOR, MD [address] [place] [address] [phone]     [facility] Emergency Department at [facility] [address] [place]  [address] [phone]        Keisa Blow, Lonni PARAS, MD 01/09/24  1056

## 2024-01-09 NOTE — ED Triage Notes (Signed)
 Patient c/o right shoulder blade pain x 2 weeks Denies injury but states she is very active Pain 6/10 in triage But says it is 8/10 at night and prevents her from sleeping Stabbing pain

## 2024-01-09 NOTE — Discharge Instructions (Addendum)
 Your history, exam, and evaluation today seem consistent with a small area of muscle pain and spasm on the right side of your mid back.  Your x-rays were reassuring and the labs were also reassuring.  We had a shared decision making conversation and agreed to hold on other extensive labs or imaging but agree with plan to give you some medications that have helped you in the past.  Please rest and stay hydrated and follow-up with your primary doctor.  Please avoid using your back too heavily for the next few days while you rest.  If any symptoms change or worsen acutely, please return to the nearest emergency department.

## 2024-01-11 ENCOUNTER — Ambulatory Visit (INDEPENDENT_AMBULATORY_CARE_PROVIDER_SITE_OTHER): Admitting: Family Medicine

## 2024-01-11 ENCOUNTER — Encounter: Payer: Self-pay | Admitting: Family Medicine

## 2024-01-11 VITALS — BP 110/78 | HR 73 | Temp 98.1°F | Wt 178.0 lb

## 2024-01-11 DIAGNOSIS — M546 Pain in thoracic spine: Secondary | ICD-10-CM | POA: Diagnosis not present

## 2024-01-11 MED ORDER — METHYLPREDNISOLONE 4 MG PO TBPK: ORAL_TABLET | ORAL | 0 refills | Status: AC

## 2024-01-11 MED ORDER — METHOCARBAMOL 500 MG PO TABS: 500.0000 mg | ORAL_TABLET | Freq: Four times a day (QID) | ORAL | 1 refills | Status: AC | PRN

## 2024-01-11 NOTE — Progress Notes (Signed)
   Subjective:    Patient ID: Tina May, female    DOB: Oct 04, 1945, 78 y.o.   MRN: [id number]  HPI Here for right sided upper back pain that started 2 weeks ago. No recent trauma. The pain is sharp and it radiates towards the right shoulder. No pain in the arm. No numbness or weakness in the arm. She went to the ED on 01-09-24, where her labs and EKG were normal. Xrays of the chest and right scapula were normal. She was tender in the area. They gave her some Methocarbamol  and some Oxycodone. These have not helped much. She is also applying heat.    Review of Systems  Constitutional: Negative.   Respiratory: Negative.    Cardiovascular: Negative.   Musculoskeletal:  Positive for back pain.       Objective:   Physical Exam Constitutional:      Appearance: Normal appearance.  Cardiovascular:     Rate and Rhythm: Normal rate and regular rhythm.     Pulses: Normal pulses.  Pulmonary:     Effort: Pulmonary effort is normal.     Breath sounds: Normal breath sounds.  Musculoskeletal:     Comments: She is not tender over the cervical or thoracic spines, but she is quite tender to the right of the mid thoracic spine in the area between it and the scapula. ROM of the spine is full.   Neurological:     Mental Status: She is alert.           Assessment & Plan:  Thoracic back pain. She will use Methocarbamol  500 mg QID as needed, and she will take a Medrol  dose pack. Recheck if this does not help. We spent a total of ( 31  ) minutes reviewing records and discussing these issues.   Garnette Olmsted, MD

## 2024-01-20 ENCOUNTER — Other Ambulatory Visit: Payer: Self-pay | Admitting: Family Medicine

## 2024-01-22 DIAGNOSIS — M47814 Spondylosis without myelopathy or radiculopathy, thoracic region: Secondary | ICD-10-CM | POA: Diagnosis not present

## 2024-01-22 DIAGNOSIS — M7918 Myalgia, other site: Secondary | ICD-10-CM | POA: Diagnosis not present

## 2024-01-22 DIAGNOSIS — M25511 Pain in right shoulder: Secondary | ICD-10-CM | POA: Diagnosis not present

## 2024-01-27 NOTE — Progress Notes (Signed)
 Tina May                                          MRN: [id number]   01/27/2024   The [facility] Quality Team Specialist reviewed this patient medical record for the purposes of chart review for care gap closure. The following were reviewed: abstraction for care gap closure-controlling blood pressure.    [facility] Quality Team

## 2024-02-24 DIAGNOSIS — R208 Other disturbances of skin sensation: Secondary | ICD-10-CM | POA: Diagnosis not present

## 2024-02-24 DIAGNOSIS — D692 Other nonthrombocytopenic purpura: Secondary | ICD-10-CM | POA: Diagnosis not present

## 2024-02-24 DIAGNOSIS — L820 Inflamed seborrheic keratosis: Secondary | ICD-10-CM | POA: Diagnosis not present

## 2024-02-24 DIAGNOSIS — Z85828 Personal history of other malignant neoplasm of skin: Secondary | ICD-10-CM | POA: Diagnosis not present

## 2024-06-07 ENCOUNTER — Telehealth: Payer: Self-pay | Admitting: Cardiology

## 2024-06-07 DIAGNOSIS — I5042 Chronic combined systolic (congestive) and diastolic (congestive) heart failure: Secondary | ICD-10-CM

## 2024-06-08 MED ORDER — SACUBITRIL-VALSARTAN 97-103 MG PO TABS: 1.0000 | ORAL_TABLET | Freq: Two times a day (BID) | ORAL | 1 refills | Status: AC

## 2024-06-08 NOTE — Telephone Encounter (Signed)
 Refill sent

## 2025-01-06 ENCOUNTER — Ambulatory Visit
